# Patient Record
Sex: Female | Born: 1935
Health system: Southern US, Community
[De-identification: ages and names within clinical notes are randomized; demographics above are authoritative.]

## PROBLEM LIST (undated history)

## (undated) DIAGNOSIS — M810 Age-related osteoporosis without current pathological fracture: Secondary | ICD-10-CM

## (undated) DIAGNOSIS — E46 Unspecified protein-calorie malnutrition: Secondary | ICD-10-CM

## (undated) DIAGNOSIS — G4700 Insomnia, unspecified: Secondary | ICD-10-CM

## (undated) DIAGNOSIS — I10 Essential (primary) hypertension: Secondary | ICD-10-CM

## (undated) DIAGNOSIS — M341 CR(E)ST syndrome: Secondary | ICD-10-CM

## (undated) DIAGNOSIS — G629 Polyneuropathy, unspecified: Secondary | ICD-10-CM

## (undated) HISTORY — DX: Essential (primary) hypertension: I10

## (undated) HISTORY — DX: Insomnia, unspecified: G47.00

## (undated) HISTORY — PX: OTHER SURGICAL HISTORY: SHX169

## (undated) HISTORY — DX: Cr(e)st syndrome: M34.1

## (undated) HISTORY — PX: CHOLECYSTECTOMY: SHX55

## (undated) HISTORY — DX: Age-related osteoporosis without current pathological fracture: M81.0

## (undated) HISTORY — PX: ABDOMINAL HYSTERECTOMY: SHX81

---

## 2010-08-13 LAB — COMPREHENSIVE METABOLIC PANEL
ALT: 22 U/L (ref 0–35)
AST: 18 U/L (ref 0–37)
Albumin: 3.4 g/dL — ABNORMAL LOW (ref 3.5–5.2)
Alkaline Phosphatase: 67 U/L (ref 39–117)
BUN: 17 mg/dL (ref 6–23)
CO2: 32 mEq/L (ref 19–32)
Calcium: 9.2 mg/dL (ref 8.4–10.5)
Chloride: 102 mEq/L (ref 96–112)
Creatinine, Ser: 0.89 mg/dL (ref 0.4–1.2)
GFR calc Af Amer: >60 mL/min (ref >60)
GFR calc non Af Amer: >60 mL/min (ref >60)
Glucose, Bld: 98 mg/dL (ref 70–99)
Potassium: 4.7 mEq/L (ref 3.5–5.1)
Sodium: 141 mEq/L (ref 135–145)
Total Bilirubin: 0.6 mg/dL (ref 0.3–1.2)
Total Protein: 6.8 g/dL (ref 6.0–8.3)

## 2010-08-13 LAB — SURGICAL PCR SCREEN
MRSA, PCR: NEGATIVE
Staphylococcus aureus: NEGATIVE

## 2010-08-13 LAB — CBC
HCT: 44.1 % (ref 36.0–46.0)
Hemoglobin: 14.2 g/dL (ref 12.0–15.0)
MCH: 30.6 pg (ref 26.0–34.0)
MCHC: 32.2 g/dL (ref 30.0–36.0)
MCV: 95 fL (ref 78.0–100.0)
Platelets: 231 10*3/uL (ref 150–400)
RBC: 4.64 MIL/uL (ref 3.87–5.11)
RDW: 14.3 % (ref 11.5–15.5)
WBC: 6.2 10*3/uL (ref 4.0–10.5)

## 2010-08-20 ENCOUNTER — Ambulatory Visit (HOSPITAL_COMMUNITY)
Admission: RE | Admit: 2010-08-20 | Discharge: 2010-08-20 | Payer: Self-pay | Source: Home / Self Care | Attending: Urology | Admitting: Urology

## 2010-08-24 NOTE — Op Note (Addendum)
Taylor Kramer, Taylor Kramer              ACCOUNT NO.:  [id number]  MEDICAL RECORD NO.:  [id number]          PATIENT TYPE:  AMB  LOCATION:  DAY                          FACILITY:  [facility], [facility], [facility]  PHYSICIAN:  Trystyn Dolley C. Vernie Ammons, M.D.  DATE OF BIRTH:  July 27, 1932  DATE OF PROCEDURE:  08/20/2010 DATE OF DISCHARGE:                              OPERATIVE REPORT   PREOPERATIVE DIAGNOSIS:  Right renal calculus.  POSTOPERATIVE DIAGNOSIS:  Right renal calculus.  PROCEDURES: 1. Cystoscopy. 2. Right retrograde pyelogram with interpretation. 3. Right double-J stent placement.  SURGEON:  Carman Auxier C. Vernie Ammons, M.D.  ANESTHESIA:  General.  BLOOD LOSS:  Zero.  DRAINS:  A 6-French, 24-cm double-J stent in the right ureter (no string)  COMPLICATIONS:  None.  INDICATIONS:  The patient is a 77 year old female who has a massive left UPJ stone that has resulted in essentially a nonfunctioning left kidney with a moderately large stone in her right renal pelvis.  With a single renal unit, we have discussed the need for treatment of her stone and also the need for a stent in preparation for that treatment with lithotripsy.  The risks, complications, alternatives, and limitations have been discussed.  The patient understands and has elected to proceed.  DESCRIPTION OF OPERATION:  After informed consent, the patient was brought to the major OR, placed on table, administered general anesthesia, then moved to the dorsal lithotomy position.  Genitalia was sterilely prepped and draped and an official time-out was then performed.  A 22-French cystoscope with 12-degree lens was then introduced into the bladder.  I noted 1+ trabeculation of the bladder but the bladder was fully inspected and noted to be free of any tumor, stones, or inflammatory lesions.  The ureteral orifices were normal in configuration and position.  A 6-French open-ended ureteral catheter was then passed through the cystoscope into the right  ureteral orifice.  Full strength Omnipaque contrast was then ejected through the open-ended stent and up the right ureter under direct fluoroscopic control.  This revealed a normal- appearing ureter throughout its course without mass effect or filling defects.  There is some tortuosity at the ureteropelvic junction region but it was patent and a filling defect was seen within the renal pelvis consistent with a stone seen on preoperative KUB.  A 0.038-inch floppy tip guidewire was then passed through the open-ended catheter, up the right ureter and into the area of the renal pelvis under fluoroscopic control.  The guidewire was left in place and the open-ended catheter was removed and it was replaced with double-J stent which was then passed over the guidewires into the renal pelvis.  As I began to remove the guidewire, good curl was noted in the renal pelvis and then in the bladder when the guidewire was completely removed.  The bladder was then drained.  The patient was awakened and taken to recovery room in stable and satisfactory condition.  She tolerated procedure well and there were no intraoperative complications.  She will be scheduled for lithotripsy of her right renal pelvic stone later today.     Anise Harbin C. Vernie Ammons, M.D.     MCO/MEDQ  D:  08/20/2010  T:  08/20/2010  Job:  [id number]  Electronically Signed by Ihor Gully M.D. on 08/24/2010 04:29:08 AM

## 2011-03-18 ENCOUNTER — Ambulatory Visit (HOSPITAL_COMMUNITY)
Admission: RE | Admit: 2011-03-18 | Discharge: 2011-03-18 | Disposition: A | Discharge status: Home / Self Care | Payer: Medicare Other | Source: Ambulatory Visit | Attending: Urology | Admitting: Urology

## 2011-03-18 DIAGNOSIS — I10 Essential (primary) hypertension: Secondary | ICD-10-CM | POA: Insufficient documentation

## 2011-03-18 DIAGNOSIS — N200 Calculus of kidney: Secondary | ICD-10-CM | POA: Insufficient documentation

## 2011-05-15 ENCOUNTER — Other Ambulatory Visit: Payer: Self-pay | Admitting: Internal Medicine

## 2011-05-15 DIAGNOSIS — Z1231 Encounter for screening mammogram for malignant neoplasm of breast: Secondary | ICD-10-CM

## 2011-06-04 ENCOUNTER — Ambulatory Visit
Admission: RE | Admit: 2011-06-04 | Discharge: 2011-06-04 | Disposition: A | Discharge status: Home / Self Care | Payer: Medicare Other | Source: Ambulatory Visit | Attending: Internal Medicine | Admitting: Internal Medicine

## 2011-06-04 DIAGNOSIS — Z1231 Encounter for screening mammogram for malignant neoplasm of breast: Secondary | ICD-10-CM

## 2011-08-08 DIAGNOSIS — R636 Underweight: Secondary | ICD-10-CM | POA: Diagnosis not present

## 2011-08-08 DIAGNOSIS — R05 Cough: Secondary | ICD-10-CM | POA: Diagnosis not present

## 2011-08-08 DIAGNOSIS — I10 Essential (primary) hypertension: Secondary | ICD-10-CM | POA: Diagnosis not present

## 2011-08-08 DIAGNOSIS — Z780 Asymptomatic menopausal state: Secondary | ICD-10-CM | POA: Diagnosis not present

## 2011-08-08 DIAGNOSIS — Z23 Encounter for immunization: Secondary | ICD-10-CM | POA: Diagnosis not present

## 2011-08-15 DIAGNOSIS — Z13820 Encounter for screening for osteoporosis: Secondary | ICD-10-CM | POA: Diagnosis not present

## 2011-08-15 DIAGNOSIS — Z780 Asymptomatic menopausal state: Secondary | ICD-10-CM | POA: Diagnosis not present

## 2011-12-06 DIAGNOSIS — N3942 Incontinence without sensory awareness: Secondary | ICD-10-CM | POA: Diagnosis not present

## 2011-12-06 DIAGNOSIS — M349 Systemic sclerosis, unspecified: Secondary | ICD-10-CM | POA: Diagnosis not present

## 2011-12-06 DIAGNOSIS — I10 Essential (primary) hypertension: Secondary | ICD-10-CM | POA: Diagnosis not present

## 2011-12-06 DIAGNOSIS — K5900 Constipation, unspecified: Secondary | ICD-10-CM | POA: Diagnosis not present

## 2011-12-06 DIAGNOSIS — G609 Hereditary and idiopathic neuropathy, unspecified: Secondary | ICD-10-CM | POA: Diagnosis not present

## 2012-02-06 DIAGNOSIS — G479 Sleep disorder, unspecified: Secondary | ICD-10-CM | POA: Diagnosis not present

## 2012-02-06 DIAGNOSIS — I10 Essential (primary) hypertension: Secondary | ICD-10-CM | POA: Diagnosis not present

## 2012-02-13 DIAGNOSIS — N200 Calculus of kidney: Secondary | ICD-10-CM | POA: Diagnosis not present

## 2012-05-20 DIAGNOSIS — I10 Essential (primary) hypertension: Secondary | ICD-10-CM | POA: Diagnosis not present

## 2012-05-20 DIAGNOSIS — F411 Generalized anxiety disorder: Secondary | ICD-10-CM | POA: Diagnosis not present

## 2012-05-20 DIAGNOSIS — Z23 Encounter for immunization: Secondary | ICD-10-CM | POA: Diagnosis not present

## 2012-05-20 DIAGNOSIS — G479 Sleep disorder, unspecified: Secondary | ICD-10-CM | POA: Diagnosis not present

## 2012-06-17 DIAGNOSIS — F411 Generalized anxiety disorder: Secondary | ICD-10-CM | POA: Diagnosis not present

## 2012-06-17 DIAGNOSIS — G4700 Insomnia, unspecified: Secondary | ICD-10-CM | POA: Diagnosis not present

## 2012-06-17 DIAGNOSIS — I10 Essential (primary) hypertension: Secondary | ICD-10-CM | POA: Diagnosis not present

## 2012-06-17 DIAGNOSIS — R05 Cough: Secondary | ICD-10-CM | POA: Diagnosis not present

## 2012-06-17 DIAGNOSIS — T148XXA Other injury of unspecified body region, initial encounter: Secondary | ICD-10-CM | POA: Diagnosis not present

## 2012-07-03 ENCOUNTER — Other Ambulatory Visit: Payer: Self-pay | Admitting: Internal Medicine

## 2012-07-03 DIAGNOSIS — Z1231 Encounter for screening mammogram for malignant neoplasm of breast: Secondary | ICD-10-CM

## 2012-08-06 DIAGNOSIS — L98499 Non-pressure chronic ulcer of skin of other sites with unspecified severity: Secondary | ICD-10-CM | POA: Diagnosis not present

## 2012-08-06 DIAGNOSIS — Z5181 Encounter for therapeutic drug level monitoring: Secondary | ICD-10-CM | POA: Diagnosis not present

## 2012-08-06 DIAGNOSIS — G479 Sleep disorder, unspecified: Secondary | ICD-10-CM | POA: Diagnosis not present

## 2012-08-06 DIAGNOSIS — I10 Essential (primary) hypertension: Secondary | ICD-10-CM | POA: Diagnosis not present

## 2012-08-06 DIAGNOSIS — M810 Age-related osteoporosis without current pathological fracture: Secondary | ICD-10-CM | POA: Diagnosis not present

## 2012-08-06 DIAGNOSIS — Z0000 Encounter for general adult medical examination without abnormal findings: Secondary | ICD-10-CM | POA: Diagnosis not present

## 2012-08-20 ENCOUNTER — Ambulatory Visit
Admission: RE | Admit: 2012-08-20 | Discharge: 2012-08-20 | Disposition: A | Discharge status: Home / Self Care | Payer: Medicare Other | Source: Ambulatory Visit | Attending: Internal Medicine | Admitting: Internal Medicine

## 2012-08-20 DIAGNOSIS — Z1231 Encounter for screening mammogram for malignant neoplasm of breast: Secondary | ICD-10-CM

## 2012-09-01 DIAGNOSIS — H61009 Unspecified perichondritis of external ear, unspecified ear: Secondary | ICD-10-CM | POA: Diagnosis not present

## 2012-12-03 DIAGNOSIS — I499 Cardiac arrhythmia, unspecified: Secondary | ICD-10-CM | POA: Diagnosis not present

## 2012-12-03 DIAGNOSIS — I10 Essential (primary) hypertension: Secondary | ICD-10-CM | POA: Diagnosis not present

## 2013-01-04 DIAGNOSIS — Z961 Presence of intraocular lens: Secondary | ICD-10-CM | POA: Diagnosis not present

## 2013-01-29 DIAGNOSIS — M25579 Pain in unspecified ankle and joints of unspecified foot: Secondary | ICD-10-CM | POA: Diagnosis not present

## 2013-02-04 DIAGNOSIS — I10 Essential (primary) hypertension: Secondary | ICD-10-CM | POA: Diagnosis not present

## 2013-02-04 DIAGNOSIS — M349 Systemic sclerosis, unspecified: Secondary | ICD-10-CM | POA: Diagnosis not present

## 2013-02-04 DIAGNOSIS — F0390 Unspecified dementia without behavioral disturbance: Secondary | ICD-10-CM | POA: Diagnosis not present

## 2013-02-04 DIAGNOSIS — W06XXXA Fall from bed, initial encounter: Secondary | ICD-10-CM | POA: Diagnosis not present

## 2013-05-13 DIAGNOSIS — M349 Systemic sclerosis, unspecified: Secondary | ICD-10-CM | POA: Diagnosis not present

## 2013-05-13 DIAGNOSIS — R6889 Other general symptoms and signs: Secondary | ICD-10-CM | POA: Diagnosis not present

## 2013-05-13 DIAGNOSIS — Z23 Encounter for immunization: Secondary | ICD-10-CM | POA: Diagnosis not present

## 2013-05-13 DIAGNOSIS — I10 Essential (primary) hypertension: Secondary | ICD-10-CM | POA: Diagnosis not present

## 2013-11-11 DIAGNOSIS — M810 Age-related osteoporosis without current pathological fracture: Secondary | ICD-10-CM | POA: Diagnosis not present

## 2013-11-11 DIAGNOSIS — Z0000 Encounter for general adult medical examination without abnormal findings: Secondary | ICD-10-CM | POA: Diagnosis not present

## 2013-11-11 DIAGNOSIS — Z23 Encounter for immunization: Secondary | ICD-10-CM | POA: Diagnosis not present

## 2013-11-11 DIAGNOSIS — I10 Essential (primary) hypertension: Secondary | ICD-10-CM | POA: Diagnosis not present

## 2013-11-11 DIAGNOSIS — Z1331 Encounter for screening for depression: Secondary | ICD-10-CM | POA: Diagnosis not present

## 2013-11-11 DIAGNOSIS — G479 Sleep disorder, unspecified: Secondary | ICD-10-CM | POA: Diagnosis not present

## 2013-11-25 DIAGNOSIS — M810 Age-related osteoporosis without current pathological fracture: Secondary | ICD-10-CM | POA: Diagnosis not present

## 2014-04-19 DIAGNOSIS — Z23 Encounter for immunization: Secondary | ICD-10-CM | POA: Diagnosis not present

## 2014-07-02 ENCOUNTER — Encounter: Payer: Self-pay | Admitting: *Deleted

## 2014-11-21 ENCOUNTER — Other Ambulatory Visit (HOSPITAL_COMMUNITY): Payer: Self-pay

## 2014-11-21 ENCOUNTER — Emergency Department (HOSPITAL_COMMUNITY): Payer: Medicare Other

## 2014-11-21 ENCOUNTER — Encounter (HOSPITAL_COMMUNITY): Payer: Self-pay | Admitting: Emergency Medicine

## 2014-11-21 ENCOUNTER — Inpatient Hospital Stay (HOSPITAL_COMMUNITY)
Admission: EM | Admit: 2014-11-21 | Discharge: 2014-11-24 | DRG: 690 | Disposition: A | Discharge status: Home / Self Care | Payer: Medicare Other | Attending: Internal Medicine | Admitting: Internal Medicine

## 2014-11-21 DIAGNOSIS — W1830XA Fall on same level, unspecified, initial encounter: Secondary | ICD-10-CM | POA: Diagnosis present

## 2014-11-21 DIAGNOSIS — M341 CR(E)ST syndrome: Secondary | ICD-10-CM | POA: Diagnosis not present

## 2014-11-21 DIAGNOSIS — Z9049 Acquired absence of other specified parts of digestive tract: Secondary | ICD-10-CM | POA: Diagnosis present

## 2014-11-21 DIAGNOSIS — R55 Syncope and collapse: Secondary | ICD-10-CM | POA: Diagnosis not present

## 2014-11-21 DIAGNOSIS — R404 Transient alteration of awareness: Secondary | ICD-10-CM | POA: Diagnosis not present

## 2014-11-21 DIAGNOSIS — I10 Essential (primary) hypertension: Secondary | ICD-10-CM | POA: Diagnosis not present

## 2014-11-21 DIAGNOSIS — Z79899 Other long term (current) drug therapy: Secondary | ICD-10-CM

## 2014-11-21 DIAGNOSIS — B951 Streptococcus, group B, as the cause of diseases classified elsewhere: Secondary | ICD-10-CM | POA: Diagnosis present

## 2014-11-21 DIAGNOSIS — Z8739 Personal history of other diseases of the musculoskeletal system and connective tissue: Secondary | ICD-10-CM

## 2014-11-21 DIAGNOSIS — Y9289 Other specified places as the place of occurrence of the external cause: Secondary | ICD-10-CM

## 2014-11-21 DIAGNOSIS — R531 Weakness: Secondary | ICD-10-CM | POA: Diagnosis not present

## 2014-11-21 DIAGNOSIS — S21212A Laceration without foreign body of left back wall of thorax without penetration into thoracic cavity, initial encounter: Secondary | ICD-10-CM | POA: Diagnosis not present

## 2014-11-21 DIAGNOSIS — G629 Polyneuropathy, unspecified: Secondary | ICD-10-CM | POA: Diagnosis present

## 2014-11-21 DIAGNOSIS — Z87891 Personal history of nicotine dependence: Secondary | ICD-10-CM

## 2014-11-21 DIAGNOSIS — N390 Urinary tract infection, site not specified: Secondary | ICD-10-CM | POA: Diagnosis not present

## 2014-11-21 DIAGNOSIS — E860 Dehydration: Secondary | ICD-10-CM | POA: Diagnosis not present

## 2014-11-21 DIAGNOSIS — R918 Other nonspecific abnormal finding of lung field: Secondary | ICD-10-CM | POA: Diagnosis not present

## 2014-11-21 DIAGNOSIS — M810 Age-related osteoporosis without current pathological fracture: Secondary | ICD-10-CM | POA: Diagnosis present

## 2014-11-21 HISTORY — DX: Polyneuropathy, unspecified: G62.9

## 2014-11-21 LAB — CBC WITH DIFFERENTIAL/PLATELET
Basophils Absolute: 0 10*3/uL (ref 0.0–0.1)
Basophils Relative: 0 % (ref 0–1)
Eosinophils Absolute: 0.1 10*3/uL (ref 0.0–0.7)
Eosinophils Relative: 3 % (ref 0–5)
HCT: 44.1 % (ref 36.0–46.0)
Hemoglobin: 14.3 g/dL (ref 12.0–15.0)
Lymphocytes Relative: 27 % (ref 12–46)
Lymphs Abs: 1.5 10*3/uL (ref 0.7–4.0)
MCH: 29.6 pg (ref 26.0–34.0)
MCHC: 32.4 g/dL (ref 30.0–36.0)
MCV: 91.3 fL (ref 78.0–100.0)
Monocytes Absolute: 0.5 10*3/uL (ref 0.1–1.0)
Monocytes Relative: 8 % (ref 3–12)
Neutro Abs: 3.5 10*3/uL (ref 1.7–7.7)
Neutrophils Relative %: 62 % (ref 43–77)
Platelets: 220 10*3/uL (ref 150–400)
RBC: 4.83 MIL/uL (ref 3.87–5.11)
RDW: 13.9 % (ref 11.5–15.5)
WBC: 5.6 10*3/uL (ref 4.0–10.5)

## 2014-11-21 LAB — COMPREHENSIVE METABOLIC PANEL
ALT: 9 U/L (ref 0–35)
AST: 18 U/L (ref 0–37)
Albumin: 3.8 g/dL (ref 3.5–5.2)
Alkaline Phosphatase: 62 U/L (ref 39–117)
Anion gap: 11 (ref 5–15)
BUN: 21 mg/dL (ref 6–23)
CO2: 28 mmol/L (ref 19–32)
Calcium: 9.1 mg/dL (ref 8.4–10.5)
Chloride: 103 mmol/L (ref 96–112)
Creatinine, Ser: 0.93 mg/dL (ref 0.50–1.10)
GFR calc Af Amer: 65 mL/min — ABNORMAL LOW (ref >90)
GFR calc non Af Amer: 56 mL/min — ABNORMAL LOW (ref >90)
Glucose, Bld: 91 mg/dL (ref 70–99)
Potassium: 3.5 mmol/L (ref 3.5–5.1)
Sodium: 142 mmol/L (ref 135–145)
Total Bilirubin: 0.4 mg/dL (ref 0.3–1.2)
Total Protein: 7 g/dL (ref 6.0–8.3)

## 2014-11-21 LAB — URINALYSIS, ROUTINE W REFLEX MICROSCOPIC
Bilirubin Urine: NEGATIVE
Glucose, UA: NEGATIVE mg/dL
Ketones, ur: NEGATIVE mg/dL
Nitrite: NEGATIVE
Protein, ur: NEGATIVE mg/dL
Specific Gravity, Urine: 1.031 — ABNORMAL HIGH (ref 1.005–1.030)
Urobilinogen, UA: 1 mg/dL (ref 0.0–1.0)
pH: 5.5 (ref 5.0–8.0)

## 2014-11-21 LAB — URINE MICROSCOPIC-ADD ON

## 2014-11-21 LAB — I-STAT TROPONIN, ED: Troponin i, poc: 0 ng/mL (ref 0.00–0.08)

## 2014-11-21 MED ORDER — VITAMIN D3 25 MCG (1000 UNIT) PO TABS
1000.0000 [IU] | ORAL_TABLET | Freq: Every day | ORAL | Status: DC
  Administered 2014-11-22 – 2014-11-24 (×3): 1000 [IU] via ORAL
  Filled 2014-11-21 (×4): qty 1

## 2014-11-21 MED ORDER — PSYLLIUM 0.52 G PO CAPS: 0.5200 g | ORAL_CAPSULE | Freq: Every day | ORAL | Status: DC

## 2014-11-21 MED ORDER — ONDANSETRON HCL 4 MG PO TABS: 4.0000 mg | ORAL_TABLET | Freq: Four times a day (QID) | ORAL | Status: DC | PRN

## 2014-11-21 MED ORDER — SODIUM CHLORIDE 0.9 % IJ SOLN
3.0000 mL | Freq: Two times a day (BID) | INTRAMUSCULAR | Status: DC
  Administered 2014-11-22 – 2014-11-23 (×3): 3 mL via INTRAVENOUS

## 2014-11-21 MED ORDER — LORATADINE 10 MG PO TABS
10.0000 mg | ORAL_TABLET | Freq: Every day | ORAL | Status: DC
  Administered 2014-11-22 – 2014-11-24 (×3): 10 mg via ORAL
  Filled 2014-11-21 (×4): qty 1

## 2014-11-21 MED ORDER — ZOLPIDEM TARTRATE 5 MG PO TABS
5.0000 mg | ORAL_TABLET | Freq: Every day | ORAL | Status: DC
  Administered 2014-11-22 – 2014-11-23 (×3): 5 mg via ORAL
  Filled 2014-11-21 (×3): qty 1

## 2014-11-21 MED ORDER — DEXTROSE 5 % IV SOLN
1.0000 g | Freq: Once | INTRAVENOUS | Status: AC
  Administered 2014-11-21: 1 g via INTRAVENOUS
  Filled 2014-11-21: qty 10

## 2014-11-21 MED ORDER — POLYETHYLENE GLYCOL 3350 17 G PO PACK: 17.0000 g | PACK | Freq: Every day | ORAL | Status: DC | PRN

## 2014-11-21 MED ORDER — AMLODIPINE BESYLATE 2.5 MG PO TABS
2.5000 mg | ORAL_TABLET | Freq: Every day | ORAL | Status: DC
  Administered 2014-11-22 – 2014-11-24 (×3): 2.5 mg via ORAL
  Filled 2014-11-21 (×5): qty 1

## 2014-11-21 MED ORDER — ONDANSETRON HCL 4 MG/2ML IJ SOLN: 4.0000 mg | Freq: Four times a day (QID) | INTRAMUSCULAR | Status: DC | PRN

## 2014-11-21 MED ORDER — ENOXAPARIN SODIUM 40 MG/0.4ML ~~LOC~~ SOLN
40.0000 mg | Freq: Every day | SUBCUTANEOUS | Status: DC
  Administered 2014-11-22 – 2014-11-23 (×3): 40 mg via SUBCUTANEOUS
  Filled 2014-11-21 (×4): qty 0.4

## 2014-11-21 MED ORDER — BISACODYL 5 MG PO TBEC
10.0000 mg | DELAYED_RELEASE_TABLET | Freq: Every day | ORAL | Status: DC | PRN
Start: 2014-11-21 — End: 2014-11-24

## 2014-11-21 MED ORDER — SODIUM CHLORIDE 0.9 % IJ SOLN: 3.0000 mL | Freq: Two times a day (BID) | INTRAMUSCULAR | Status: DC

## 2014-11-21 MED ORDER — ZOLPIDEM TARTRATE 10 MG PO TABS: 10.0000 mg | ORAL_TABLET | Freq: Every day | ORAL | Status: DC

## 2014-11-21 MED ORDER — CALCIUM POLYCARBOPHIL 625 MG PO TABS
625.0000 mg | ORAL_TABLET | Freq: Every day | ORAL | Status: DC
  Administered 2014-11-22 – 2014-11-24 (×3): 625 mg via ORAL
  Filled 2014-11-21 (×4): qty 1

## 2014-11-21 MED ORDER — CEFTRIAXONE SODIUM IN DEXTROSE 20 MG/ML IV SOLN
1.0000 g | INTRAVENOUS | Status: DC
  Administered 2014-11-22: 1 g via INTRAVENOUS
  Filled 2014-11-21 (×2): qty 50

## 2014-11-21 MED ORDER — FLUTICASONE PROPIONATE 50 MCG/ACT NA SUSP
2.0000 | Freq: Every day | NASAL | Status: DC
  Administered 2014-11-22 – 2014-11-24 (×3): 2 via NASAL
  Filled 2014-11-21: qty 16

## 2014-11-21 MED ORDER — ACETAMINOPHEN 325 MG PO TABS: 650.0000 mg | ORAL_TABLET | Freq: Four times a day (QID) | ORAL | Status: DC | PRN

## 2014-11-21 MED ORDER — ACETAMINOPHEN 650 MG RE SUPP: 650.0000 mg | Freq: Four times a day (QID) | RECTAL | Status: DC | PRN

## 2014-11-21 NOTE — Progress Notes (Signed)
EDCM spoke to patient and her family at bedside.  Patient confirms she is from [facility] at [place].  Patient reports her pcp is located at [facility] at [place].  Patient's pcp used to be Dr. Myrtis Ser, but she has left the practice per patient.  Patient unable to tell [facility] who her new pcp is at the same office.  No further EDCM needs at this time.

## 2014-11-21 NOTE — ED Provider Notes (Signed)
CSN: [id number]     Arrival date & time 11/21/14  1800 History   First MD Initiated Contact with Patient 11/21/14 1805     Chief Complaint  Patient presents with  . Weakness     (Consider location/radiation/quality/duration/timing/severity/associated sxs/prior Treatment) Patient is a 81 y.o. female presenting with weakness. The history is provided by the patient. No language interpreter was used.  Weakness Associated symptoms include weakness. Pertinent negatives include no headaches, nausea, numbness or vomiting.  Taylor Kramer is an 81 y.o female with a history of HTN who presents by EMS from [facility] for weakness after getting out of the shower today.  She states someone helped her shower but she could not stand.  She was put in a wheelchair and they took her for lunch thinking that maybe she needed to eat something.  She states that after she came back from eating she used the walker to go to the bathroom and still felt weak.  She collapsed on the floor but slumped to the floor and sat down. She states she then called for help.  She denies loss of consciousness or head injury. She states she felt fine before she went for a shower. She denies fever, headache, cough, chest pain, shortness of breath, abdominal pain, dysuria, urinary frequency, hematuria, or leg swelling.   Past Medical History  Diagnosis Date  . Hypertension   . Osteoporosis   . CREST syndrome   . Insomnia    History reviewed. No pertinent past surgical history. Family History  Problem Relation Age of Onset  . Family history unknown: Yes   History  Substance Use Topics  . Smoking status: Former Research scientist (life sciences)  . Smokeless tobacco: Not on file  . Alcohol Use: Yes     Comment: one drink a wek   OB History    No data available     Review of Systems  Gastrointestinal: Negative for nausea, vomiting and diarrhea.  Neurological: Positive for weakness. Negative for dizziness, syncope, numbness and headaches.   All other systems reviewed and are negative.     Allergies  Review of patient's allergies indicates no known allergies.  Home Medications   Prior to Admission medications   Medication Sig Start Date End Date Taking? Authorizing Provider  acetaminophen (TYLENOL) 500 MG tablet Take 500 mg by mouth every 6 (six) hours as needed for moderate pain.   Yes Historical Provider, MD  amLODipine (NORVASC) 2.5 MG tablet Take 2.5 mg by mouth daily.   Yes Historical Provider, MD  bisacodyl (DULCOLAX) 5 MG EC tablet Take 10 mg by mouth daily as needed for mild constipation or moderate constipation.   Yes Historical Provider, MD  cholecalciferol (VITAMIN D) 1000 UNITS tablet Take 1,000 Units by mouth daily.   Yes Historical Provider, MD  fluticasone (FLONASE) 50 MCG/ACT nasal spray Place 2 sprays into both nostrils daily.   Yes Historical Provider, MD  loratadine (CLARITIN) 10 MG tablet Take 10 mg by mouth daily.   Yes Historical Provider, MD  polyethylene glycol (MIRALAX / GLYCOLAX) packet Take 17 g by mouth daily as needed for mild constipation.   Yes Historical Provider, MD  psyllium (KONSYL) 0.52 G capsule Take 0.52 g by mouth daily.   Yes Historical Provider, MD  alendronate (FOSAMAX) 70 MG tablet Take 70 mg by mouth once a week. Take with a full glass of water on an empty stomach on Fridays.    Historical Provider, MD  zolpidem (AMBIEN) 10 MG tablet Take 10  mg by mouth at bedtime.     Historical Provider, MD   BP 119/61 mmHg  Pulse 82  Temp(Src) 97.5 F (36.4 C) (Oral)  Resp 22  SpO2 100% Physical Exam  Constitutional: She is oriented to person, place, and time. She appears well-developed and well-nourished.  HENT:  Head: Normocephalic and atraumatic.  Eyes: Conjunctivae are normal.  Neck: Normal range of motion. Neck supple.  Cardiovascular: Normal rate, regular rhythm and normal heart sounds.   Pulmonary/Chest: Effort normal. She has wheezes in the right lower field, the left middle  field and the left lower field.  Abdominal: Soft. Normal appearance. She exhibits no distension. There is no tenderness. There is no guarding.  Musculoskeletal: Normal range of motion.  Neurological: She is alert and oriented to person, place, and time. No sensory deficit. GCS eye subscore is 4. GCS verbal subscore is 5. GCS motor subscore is 6.  Cranial nerves III-XII intact. Upper extremity strength is 4/5.  Lower extremity strength is 3/5.   Skin: Skin is warm and dry.  Nursing note and vitals reviewed.   ED Course  Procedures (including critical care time) Labs Review Labs Reviewed  COMPREHENSIVE METABOLIC PANEL - Abnormal; Notable for the following:    GFR calc non Af Amer 56 (*)    GFR calc Af Amer 65 (*)    All other components within normal limits  URINALYSIS, ROUTINE W REFLEX MICROSCOPIC - Abnormal; Notable for the following:    APPearance CLOUDY (*)    Specific Gravity, Urine 1.031 (*)    Hgb urine dipstick SMALL (*)    Leukocytes, UA LARGE (*)    All other components within normal limits  URINE MICROSCOPIC-ADD ON - Abnormal; Notable for the following:    Bacteria, UA MANY (*)    Casts HYALINE CASTS (*)    All other components within normal limits  CBC WITH DIFFERENTIAL/PLATELET  I-STAT TROPOININ, ED    Imaging Review Dg Chest 2 View  11/21/2014   CLINICAL DATA:  Pt c/o weakness, sob, back pain at lacerations across left dorsum s/p fall today at her nursing home. Pt states she just "fell out" and has been unable to recover energy.  EXAM: CHEST  2 VIEW  COMPARISON:  08/09/2010  FINDINGS: Cardiac silhouette normal in size and configuration. Aorta is mildly uncoiled. No mediastinal or hilar masses or evidence of adenopathy.  There there is increased peribronchial opacity in the left lower lobe when compared to the prior chest radiograph. This is accentuated by lower lung volumes and some rotation. However, could reflect acute bronchitis in the proper clinical setting. The  remainder of the lungs is clear. No pleural effusion or pneumothorax.  Bony thorax is demineralized but grossly intact.  IMPRESSION: 1. Left lower lobe opacity is most likely chronic but could reflect acute bronchitis if there are consistent clinical symptoms. No evidence of pneumonia or pulmonary edema.   Electronically Signed   By: Lajean Manes M.D.   On: 11/21/2014 20:23   EKG interpretation  Vent. rate 78 BPM PR interval 182 ms QRS duration 99 ms QT/QTc 422/481 ms P-R-T axes 50 -59 61 Sinus rhythm RSR' in V1 or V2, probably normal variant Borderline T wave abnormalities   MDM   Final diagnoses:  Acute UTI  Weakness generalized  Patient presents for weakness.  No other complaints and no pain. Her strength on exam is slightly decreased in the lower greater than upper extremities. No focal deficit. She is ambulatory but with a  wheelchair.   Her CMP and CBC are unremarkable.  Her CXR shows no pneumonia or pleural effusion. Her troponin is negative and her ekg is not concerning.  Her vitals are stable and she is afebrile.   She does have a UTI which I think is the likely cause of her collapsing to the floor and her weakness.   I have discussed this case with Dr. Johnney Killian who has seen the patient. She will need admission for weakness and UTI.  I have started the patient on IV Rocephin.  Dr. Hal Hope spoke to Dr. Johnney Killian in the ED.  He will accept the patient to tele.     Ottie Glazier, PA-C 11/22/14 0005  Charlesetta Shanks, MD 11/26/14 [phone]

## 2014-11-21 NOTE — Progress Notes (Signed)
ANTIBIOTIC CONSULT NOTE - INITIAL  Pharmacy Consult for Ceftriaxone Indication: UTI  No Known Allergies  Patient Measurements: Height: 5\' 7"  (170.2 cm) Weight: 125 lb 9.6 oz (56.972 kg) IBW/kg (Calculated) : 61.6  Vital Signs: Temp: 98.3 F (36.8 C) (04/25 2330) Temp Source: Oral (04/25 2330) BP: 125/74 mmHg (04/25 2330) Pulse Rate: 92 (04/25 2330) Intake/Output from previous day:   Intake/Output from this shift:    Labs:  Recent Labs  11/21/14 1828  WBC 5.6  HGB 14.3  PLT 220  CREATININE 0.93   Estimated Creatinine Clearance: 42.7 mL/min (by C-G formula based on Cr of 0.93). No results for input(s): VANCOTROUGH, VANCOPEAK, VANCORANDOM, GENTTROUGH, GENTPEAK, GENTRANDOM, TOBRATROUGH, TOBRAPEAK, TOBRARND, AMIKACINPEAK, AMIKACINTROU, AMIKACIN in the last 72 hours.   Microbiology: No results found for this or any previous visit (from the past 720 hour(s)).  Medical History: Past Medical History  Diagnosis Date  . Hypertension   . Osteoporosis   . CREST syndrome   . Insomnia   . Neuropathy     Medications:  Scheduled:  . [START ON 11/22/2014] amLODipine  2.5 mg Oral Daily  . [START ON 11/22/2014] cefTRIAXone (ROCEPHIN)  IV  1 g Intravenous Q24H  . [START ON 11/22/2014] cholecalciferol  1,000 Units Oral Daily  . enoxaparin (LOVENOX) injection  40 mg Subcutaneous QHS  . [START ON 11/22/2014] fluticasone  2 spray Each Nare Daily  . [START ON 11/22/2014] loratadine  10 mg Oral Daily  . [START ON 11/22/2014] polycarbophil  625 mg Oral Daily  . sodium chloride  3 mL Intravenous Q12H  . sodium chloride  3 mL Intravenous Q12H  . zolpidem  5 mg Oral QHS   Infusions:   Assessment:  81 yr female s/p fall.  UA suggests UTI.  Patient has received Ceftriaxone 1gm IV x 1 in the ED.  Pharmacy consulted to dose Ceftriaxone for UTI  Urine culture ordered  CrCl ~ 42 ml/min  Goal of Therapy:  Eradication of infection  Plan:  Follow up culture results  Ceftriaxone 1gm IV  q24h - no further adjustment of dose necessary  Kamden Stanislaw, Toribio Harbour, PharmD 11/21/2014,11:42 PM

## 2014-11-21 NOTE — ED Notes (Signed)
Bed: WA01 Expected date:  Expected time:  Means of arrival:  Comments: EMS/81yo/weakness

## 2014-11-21 NOTE — H&P (Signed)
[facility] History and Physical  Taylor Kramer OFB:[id number] DOB: 1931-09-28 DOA: 11/21/2014  Referring physician: Dr. Vallery Kramer. ER physician. PCP: PROVIDER NOT IN SYSTEM used to be Taylor Kramer. Patient follows up at [facility] at [place].  Chief Complaint: Fall.  HPI: Taylor Kramer is a 81 y.o. female with history of crest syndrome, hypertension, neuropathy with difficulty talking who is usually on wheelchair and would be able to ambulate to the bathroom with help of walker, lives at a nursing home had a sudden fall while trying to walk to the bathroom. Patient states she felt something warm going up from her epigastric area to the chest following which he suddenly lost control and fell. Did not lose consciousness. Did not have any chest pain shortness of breath nausea vomiting abdominal pain diarrhea fever chills or any productive cough. After the fall patient was not able to get up herself. Patient was brought to the ER and in the ER chest x-ray was showing non-specific findings and EKG was a normal sinus rhythm with nonspecific T-wave changes. Cardiac markers were negative. UA showing features of UTI. Patient has been elevated for further observation. Patient states 2 years ago she had palpitations and at the time patient was referred to cardiologist and workup at that time was negative. Presently denies any palpitations. [name] states when he saw her first after the fall patient's lower extremities were looking cyanotic.    Review of Systems: As presented in the history of presenting illness, rest negative.  Past Medical History  Diagnosis Date  . Hypertension   . Osteoporosis   . CREST syndrome   . Insomnia   . Neuropathy    Past Surgical History  Procedure Laterality Date  . Cholecystectomy    . Abdominal hysterectomy    . Renal stones     Social History:  reports that she has quit smoking. She does not have any smokeless tobacco history on file. She reports  that she drinks alcohol. She reports that she does not use illicit drugs. Where does patient live nursing home. Can patient participate in ADLs? Yes.  No Known Allergies  Family History:  Family History  Problem Relation Age of Onset  . Neuropathy Brother       Prior to Admission medications   Medication Sig Start Date End Date Taking? Authorizing Provider  acetaminophen (TYLENOL) 500 MG tablet Take 500 mg by mouth every 6 (six) hours as needed for moderate pain.   Yes Historical Provider, MD  amLODipine (NORVASC) 2.5 MG tablet Take 2.5 mg by mouth daily.   Yes Historical Provider, MD  bisacodyl (DULCOLAX) 5 MG EC tablet Take 10 mg by mouth daily as needed for mild constipation or moderate constipation.   Yes Historical Provider, MD  cholecalciferol (VITAMIN D) 1000 UNITS tablet Take 1,000 Units by mouth daily.   Yes Historical Provider, MD  fluticasone (FLONASE) 50 MCG/ACT nasal spray Place 2 sprays into both nostrils daily.   Yes Historical Provider, MD  loratadine (CLARITIN) 10 MG tablet Take 10 mg by mouth daily.   Yes Historical Provider, MD  polyethylene glycol (MIRALAX / GLYCOLAX) packet Take 17 g by mouth daily as needed for mild constipation.   Yes Historical Provider, MD  psyllium (KONSYL) 0.52 G capsule Take 0.52 g by mouth daily.   Yes Historical Provider, MD  alendronate (FOSAMAX) 70 MG tablet Take 70 mg by mouth once a week. Take with a full glass of water on an empty stomach on Fridays.  Historical Provider, MD  zolpidem (AMBIEN) 10 MG tablet Take 10 mg by mouth at bedtime.     Historical Provider, MD    Physical Exam: Filed Vitals:   11/21/14 1802 11/21/14 2048 11/21/14 2100 11/21/14 2310  BP: 137/70 119/61 139/70 120/66  Pulse: 78 82 79 86  Temp: 97.5 F (36.4 C)     TempSrc: Oral     Resp: 16 22 16 19   SpO2: 95% 100% 92% 94%     General:  Moderately built and nourished.  Eyes: Anicteric no pallor.  ENT: No discharge from the ears eyes nose and  mouth.  Neck: No mass felt.  Cardiovascular: S1-S2 heard.  Respiratory: No rhonchi or crepitations.  Abdomen: Soft nontender bowel sounds present.  Skin: No rash.  Musculoskeletal: No edema.  Psychiatric: Appears normal.  Neurologic: Alert awake oriented to time place and person. Moves all extremities.  Labs on Admission:  Basic Metabolic Panel:  Recent Labs Lab 11/21/14 1828  NA 142  K 3.5  CL 103  CO2 28  GLUCOSE 91  BUN 21  CREATININE 0.93  CALCIUM 9.1   Liver Function Tests:  Recent Labs Lab 11/21/14 1828  AST 18  ALT 9  ALKPHOS 62  BILITOT 0.4  PROT 7.0  ALBUMIN 3.8   No results for input(s): LIPASE, AMYLASE in the last 168 hours. No results for input(s): AMMONIA in the last 168 hours. CBC:  Recent Labs Lab 11/21/14 1828  WBC 5.6  NEUTROABS 3.5  HGB 14.3  HCT 44.1  MCV 91.3  PLT 220   Cardiac Enzymes: No results for input(s): CKTOTAL, CKMB, CKMBINDEX, TROPONINI in the last 168 hours.  BNP (last 3 results) No results for input(s): BNP in the last 8760 hours.  ProBNP (last 3 results) No results for input(s): PROBNP in the last 8760 hours.  CBG: No results for input(s): GLUCAP in the last 168 hours.  Radiological Exams on Admission: Dg Chest 2 View  11/21/2014   CLINICAL DATA:  Pt c/o weakness, sob, back pain at lacerations across left dorsum s/p fall today at her nursing home. Pt states she just "fell out" and has been unable to recover energy.  EXAM: CHEST  2 VIEW  COMPARISON:  08/09/2010  FINDINGS: Cardiac silhouette normal in size and configuration. Aorta is mildly uncoiled. No mediastinal or hilar masses or evidence of adenopathy.  There there is increased peribronchial opacity in the left lower lobe when compared to the prior chest radiograph. This is accentuated by lower lung volumes and some rotation. However, could reflect acute bronchitis in the proper clinical setting. The remainder of the lungs is clear. No pleural effusion or  pneumothorax.  Bony thorax is demineralized but grossly intact.  IMPRESSION: 1. Left lower lobe opacity is most likely chronic but could reflect acute bronchitis if there are consistent clinical symptoms. No evidence of pneumonia or pulmonary edema.   Electronically Signed   By: Lajean Manes M.D.   On: 11/21/2014 20:23    EKG: Independently reviewed. Normal sinus rhythm with nonspecific T-wave changes. QTC 481 ms.  Assessment/Plan Principal Problem:   Near syncope Active Problems:   UTI (lower urinary tract infection)   Hypertension   History of CREST syndrome   Acute UTI   1. Near syncope - cause not clear. At this time we will closely monitor in telemetry for any arrhythmias. Check 2-D echo. Since patient had some abdominal discomfort in the chest prior to the episode we will cycle cardiac markers. 2.  UTI - patient has been placed on ceftriaxone. Check urine cultures. 3. History of crest was near normal medications. 4. Abnormal chest x-ray - may need follow-up as outpatient.   DVT Prophylaxis Lovenox.  Code Status: Full code.  Family Communication: Patient's sons at the bedside.  Disposition Plan: Admit for observation.    KAKRAKANDY,ARSHAD N. [facility] Pager [phone].  If 7PM-7AM, please contact night-coverage [email or web address] Password [facility] 11/21/2014, 11:28 PM

## 2014-11-21 NOTE — ED Notes (Signed)
Placed patient on bed pain for urine specimen. Unable to give urine specimen. Will attempt again.

## 2014-11-21 NOTE — ED Notes (Signed)
Per EMS: pt c/o weakness since 1000 this morning, pt states she was walking and knees buckled so she sat on the floor. No injury from sitting.

## 2014-11-22 DIAGNOSIS — Z87891 Personal history of nicotine dependence: Secondary | ICD-10-CM | POA: Diagnosis not present

## 2014-11-22 DIAGNOSIS — E860 Dehydration: Secondary | ICD-10-CM | POA: Diagnosis present

## 2014-11-22 DIAGNOSIS — R531 Weakness: Secondary | ICD-10-CM | POA: Diagnosis not present

## 2014-11-22 DIAGNOSIS — I10 Essential (primary) hypertension: Secondary | ICD-10-CM | POA: Diagnosis not present

## 2014-11-22 DIAGNOSIS — G629 Polyneuropathy, unspecified: Secondary | ICD-10-CM | POA: Diagnosis present

## 2014-11-22 DIAGNOSIS — M810 Age-related osteoporosis without current pathological fracture: Secondary | ICD-10-CM | POA: Diagnosis present

## 2014-11-22 DIAGNOSIS — Z79899 Other long term (current) drug therapy: Secondary | ICD-10-CM | POA: Diagnosis not present

## 2014-11-22 DIAGNOSIS — W1830XA Fall on same level, unspecified, initial encounter: Secondary | ICD-10-CM | POA: Diagnosis present

## 2014-11-22 DIAGNOSIS — Z8739 Personal history of other diseases of the musculoskeletal system and connective tissue: Secondary | ICD-10-CM | POA: Diagnosis not present

## 2014-11-22 DIAGNOSIS — Y9289 Other specified places as the place of occurrence of the external cause: Secondary | ICD-10-CM | POA: Diagnosis not present

## 2014-11-22 DIAGNOSIS — Z9049 Acquired absence of other specified parts of digestive tract: Secondary | ICD-10-CM | POA: Diagnosis present

## 2014-11-22 DIAGNOSIS — R55 Syncope and collapse: Secondary | ICD-10-CM | POA: Diagnosis not present

## 2014-11-22 DIAGNOSIS — B951 Streptococcus, group B, as the cause of diseases classified elsewhere: Secondary | ICD-10-CM | POA: Diagnosis present

## 2014-11-22 DIAGNOSIS — M341 CR(E)ST syndrome: Secondary | ICD-10-CM | POA: Diagnosis present

## 2014-11-22 DIAGNOSIS — N390 Urinary tract infection, site not specified: Secondary | ICD-10-CM | POA: Diagnosis not present

## 2014-11-22 LAB — BRAIN NATRIURETIC PEPTIDE: B NATRIURETIC PEPTIDE 5: 41.1 pg/mL (ref 0.0–100.0)

## 2014-11-22 LAB — CBC WITH DIFFERENTIAL/PLATELET
BASOS PCT: 0 % (ref 0–1)
Basophils Absolute: 0 10*3/uL (ref 0.0–0.1)
EOS PCT: 3 % (ref 0–5)
Eosinophils Absolute: 0.2 10*3/uL (ref 0.0–0.7)
HEMATOCRIT: 41.3 % (ref 36.0–46.0)
HEMOGLOBIN: 12.8 g/dL (ref 12.0–15.0)
Lymphocytes Relative: 27 % (ref 12–46)
Lymphs Abs: 1.8 10*3/uL (ref 0.7–4.0)
MCH: 28.6 pg (ref 26.0–34.0)
MCHC: 31 g/dL (ref 30.0–36.0)
MCV: 92.2 fL (ref 78.0–100.0)
MONOS PCT: 7 % (ref 3–12)
Monocytes Absolute: 0.4 10*3/uL (ref 0.1–1.0)
Neutro Abs: 4.2 10*3/uL (ref 1.7–7.7)
Neutrophils Relative %: 63 % (ref 43–77)
Platelets: 212 10*3/uL (ref 150–400)
RBC: 4.48 MIL/uL (ref 3.87–5.11)
RDW: 14.2 % (ref 11.5–15.5)
WBC: 6.7 10*3/uL (ref 4.0–10.5)

## 2014-11-22 LAB — CREATININE, SERUM
Creatinine, Ser: 0.83 mg/dL (ref 0.50–1.10)
GFR calc Af Amer: 75 mL/min — ABNORMAL LOW (ref >90)
GFR, EST NON AFRICAN AMERICAN: 64 mL/min — AB (ref >90)

## 2014-11-22 LAB — COMPREHENSIVE METABOLIC PANEL
ALBUMIN: 3.3 g/dL — AB (ref 3.5–5.2)
ALT: 9 U/L (ref 0–35)
AST: 15 U/L (ref 0–37)
Alkaline Phosphatase: 60 U/L (ref 39–117)
Anion gap: 8 (ref 5–15)
BILIRUBIN TOTAL: 0.5 mg/dL (ref 0.3–1.2)
BUN: 17 mg/dL (ref 6–23)
CHLORIDE: 104 mmol/L (ref 96–112)
CO2: 31 mmol/L (ref 19–32)
CREATININE: 0.8 mg/dL (ref 0.50–1.10)
Calcium: 8.7 mg/dL (ref 8.4–10.5)
GFR calc Af Amer: 78 mL/min — ABNORMAL LOW (ref >90)
GFR, EST NON AFRICAN AMERICAN: 67 mL/min — AB (ref >90)
GLUCOSE: 82 mg/dL (ref 70–99)
POTASSIUM: 3.7 mmol/L (ref 3.5–5.1)
Sodium: 143 mmol/L (ref 135–145)
Total Protein: 6 g/dL (ref 6.0–8.3)

## 2014-11-22 LAB — CBC
HCT: 42.8 % (ref 36.0–46.0)
HEMOGLOBIN: 13.6 g/dL (ref 12.0–15.0)
MCH: 29.1 pg (ref 26.0–34.0)
MCHC: 31.8 g/dL (ref 30.0–36.0)
MCV: 91.6 fL (ref 78.0–100.0)
PLATELETS: 237 10*3/uL (ref 150–400)
RBC: 4.67 MIL/uL (ref 3.87–5.11)
RDW: 14.1 % (ref 11.5–15.5)
WBC: 8.7 10*3/uL (ref 4.0–10.5)

## 2014-11-22 LAB — MRSA PCR SCREENING: MRSA BY PCR: NEGATIVE

## 2014-11-22 LAB — TROPONIN I: Troponin I: <0.03 ng/mL (ref <0.031)

## 2014-11-22 MED ORDER — PANTOPRAZOLE SODIUM 40 MG PO TBEC
40.0000 mg | DELAYED_RELEASE_TABLET | Freq: Every day | ORAL | Status: DC
  Administered 2014-11-22 – 2014-11-24 (×3): 40 mg via ORAL
  Filled 2014-11-22 (×3): qty 1

## 2014-11-22 NOTE — Evaluation (Signed)
Physical Therapy Evaluation Patient Details Name: Taylor Kramer MRN: [id number] DOB: 1932-02-08 Today's Date: 11/22/2014   History of Present Illness  81 yo female admitted with neary syncope, weakness, fall at ALF. Hx of HTN, osteoporosis, neuropathy  Clinical Impression  On eval, pt required Mod assist for mobility-able to stand at EOB and perform pre-gait/side-stepping with RW. Pt is weak-fatigues easily with minimal activity. Pt is unsteady and ataxic with performance of standing/stepping tasks. Remains at high risk for falls. Discussed d/c plan-feel pt may need ST rehab at SNF however pt prefers to return to ALF. ALF will need to be able to provide supervision and assist for ALL tasks.     Follow Up Recommendations SNF;Supervision/Assistance - 24 hour (unless facility can provide 24 hour supervision and assist for ALL tasks.)    Equipment Recommendations  None recommended by PT    Recommendations for Other Services OT consult     Precautions / Restrictions Precautions Precautions: Fall Restrictions Weight Bearing Restrictions: No      Mobility  Bed Mobility Overal bed mobility: Needs Assistance Bed Mobility: Supine to Sit;Sit to Supine     Supine to sit: HOB elevated;Mod assist Sit to supine: HOB elevated;Min assist   General bed mobility comments: Assist for trunk and LEs. Increased time. Mobility is effortful for pt.   Transfers Overall transfer level: Needs assistance Equipment used: Rolling walker (2 wheeled) Transfers: Sit to/from Stand Sit to Stand: Mod assist         General transfer comment: Assist to rise, stabilize, control descent. VCs safety, technique hand placement. Difficulty with trunk/hip extension. Wide stance in static standing.   Ambulation/Gait Ambulation/Gait assistance: Mod assist           General Gait Details: Pre-gait at [facility] in place x3 each leg-increased time/difficulty noted. Very unsteady/ataxic. Side steps to HOb with  RW  Stairs            Wheelchair Mobility    Modified Rankin (Stroke Patients Only)       Balance Overall balance assessment: History of Falls;Needs assistance         Standing balance support: Bilateral upper extremity supported;During functional activity Standing balance-Leahy Scale: Poor                               Pertinent Vitals/Pain Pain Assessment: No/denies pain    Home Living Family/patient expects to be discharged to:: [place]: [place] Rile - 2 wheels;Wheelchair - manual;Grab bars - tub/shower      Prior Function Level of Independence: Needs assistance   Gait / Transfers Assistance Needed: uses RW-small distances in room only. Wheelchair to get to/from dining room  ADL's / Homemaking Assistance Needed: assist with bathiing and getting into shower. pt able to dress herself. assist with taking meds.         Hand Dominance        Extremity/Trunk Assessment   Upper Extremity Assessment: LUE deficits/detail;RUE deficits/detail     RUE Sensation: decreased light touch;decreased proprioception     Lower Extremity Assessment: RLE deficits/detail;LLE deficits/detail RLE Deficits / Details: Strength ~3+/5 LLE Deficits / Details: Strength ~3+/5  Cervical / Trunk Assessment: Kyphotic  Communication   Communication: Expressive difficulties  Cognition Arousal/Alertness: Awake/alert Behavior During Therapy: WFL for tasks assessed/performed Overall Cognitive Status: Within Functional Limits for tasks assessed  General Comments      Exercises        Assessment/Plan    PT Assessment Patient needs continued PT services  PT Diagnosis Difficulty walking;Abnormality of gait;Generalized weakness   PT Problem List Decreased strength;Decreased activity tolerance;Decreased range of motion;Decreased balance;Decreased mobility;Decreased knowledge of use of DME;Impaired sensation   PT Treatment Interventions DME instruction;Gait training;Functional mobility training;Therapeutic activities;Therapeutic exercise;Patient/family education;Balance training   PT Goals (Current goals can be found in the Care Plan section) Acute Rehab PT Goals Patient Stated Goal: to return to ALF PT Goal Formulation: With patient Time For Goal Achievement: 12/06/14 Potential to Achieve Goals: Good    Frequency Min 3X/week   Barriers to discharge        Co-evaluation               End of Session Equipment Utilized During Treatment: Gait belt Activity Tolerance: Patient limited by fatigue Patient left: in bed;with call bell/phone within reach;with bed alarm set      Functional Assessment Tool Used: clinical judgement Functional Limitation: Mobility: Walking and moving around Mobility: Walking and Moving Around Current Status (Z4827): At least 20 percent but less than 40 percent impaired, limited or restricted Mobility: Walking and Moving Around Goal Status [phone]): At least 1 percent but less than 20 percent impaired, limited or restricted    Time: 0940-1005 PT Time Calculation (min) (ACUTE ONLY): 25 min   Charges:   PT Evaluation $Initial PT Evaluation Tier I: 1 Procedure PT Treatments $Therapeutic Activity: 8-22 mins   PT G Codes:   PT G-Codes **NOT FOR INPATIENT CLASS** Functional Assessment Tool Used: clinical judgement Functional Limitation: Mobility: Walking and moving around Mobility: Walking and Moving Around Current Status (J4492): At least 20 percent but less than 40 percent impaired, limited or restricted Mobility: Walking and Moving Around Goal Status [phone]): At least 1 percent but less than 20 percent impaired, limited or restricted    Weston Anna, MPT Pager: [phone]

## 2014-11-22 NOTE — Progress Notes (Signed)
UR completed 

## 2014-11-22 NOTE — Progress Notes (Signed)
Patient Demographics  Taylor Kramer, is a 81 y.o. female, DOB - 01/19/1932, ELT:[id number]  Admit date - 11/21/2014   Admitting Physician Rise Patience, MD  Outpatient Primary MD for the patient is PROVIDER NOT IN SYSTEM  LOS - 0   Chief Complaint  Patient presents with  . Weakness        Subjective:   Taylor Kramer today has, No headache, No chest pain, No abdominal pain - No Nausea, No Cough - SOB, still reports generalized weakness, reports her appetite is improving.  Assessment & Plan    Principal Problem:   Near syncope Active Problems:   UTI (lower urinary tract infection)   Hypertension   History of CREST syndrome   Acute UTI  Presyncope - Patient denies any loss of consciousness, this is most likely related to UTI and clinical dehydration. - Follow on 2-D echo - Cardiac enzymes negative 3  UTI - Continue with IV Rocephin, follow on urine cultures.  Hypertension - Continue with Norvasc  History of crest syndrome - Continue to follow as an outpatient   Code Status: Full  Family Communication: None at bedside  Disposition Plan: PT consult   Procedures  None   Consults   None   Medications  Scheduled Meds: . amLODipine  2.5 mg Oral Daily  . cefTRIAXone (ROCEPHIN)  IV  1 g Intravenous Q24H  . cholecalciferol  1,000 Units Oral Daily  . enoxaparin (LOVENOX) injection  40 mg Subcutaneous QHS  . fluticasone  2 spray Each Nare Daily  . loratadine  10 mg Oral Daily  . polycarbophil  625 mg Oral Daily  . sodium chloride  3 mL Intravenous Q12H  . sodium chloride  3 mL Intravenous Q12H  . zolpidem  5 mg Oral QHS   Continuous Infusions:  PRN Meds:.acetaminophen **OR** acetaminophen, bisacodyl, ondansetron **OR** ondansetron (ZOFRAN) IV, polyethylene glycol  DVT Prophylaxis  Lovenox   Lab Results  Component Value  Date   PLT 212 11/22/2014    Antibiotics    Anti-infectives    Start     Dose/Rate Route Frequency Ordered Stop   11/22/14 2300  cefTRIAXone (ROCEPHIN) 1 g in dextrose 5 % 50 mL IVPB - Premix     1 g 100 mL/hr over 30 Minutes Intravenous Every 24 hours 11/21/14 2341     11/21/14 2245  cefTRIAXone (ROCEPHIN) 1 g in dextrose 5 % 50 mL IVPB     1 g 100 mL/hr over 30 Minutes Intravenous  Once 11/21/14 2236 11/21/14 2336          Objective:   Filed Vitals:   11/21/14 2310 11/21/14 2330 11/22/14 0640 11/22/14 1415  BP: 120/66 125/74 129/70 125/69  Pulse: 86 92 82 94  Temp:  98.3 F (36.8 C) 98 F (36.7 C) 99.4 F (37.4 C)  TempSrc:  Oral Oral Oral  Resp: 19 20 19 20   Height:  5\' 7"  (1.702 m)    Weight:  56.972 kg (125 lb 9.6 oz)    SpO2: 94% 97% 95% 99%    Wt Readings from Last 3 Encounters:  11/21/14 56.972 kg (125 lb 9.6 oz)     Intake/Output Summary (Last 24 hours) at 11/22/14 1528 Last data filed at 11/22/14  1416  Gross per 24 hour  Intake    480 ml  Output    601 ml  Net   -121 ml     Physical Exam  Awake Alert, Oriented X 3, No new F.N deficits, Normal affect [place].AT,PERRAL Supple Neck,No JVD, No cervical lymphadenopathy appriciated.  Symmetrical Chest wall movement, Good air movement bilaterally, CTAB RRR,No Gallops,Rubs or new Murmurs, No Parasternal Heave +ve B.Sounds, Abd Soft, No tenderness, No organomegaly appriciated, No rebound - guarding or rigidity. No Cyanosis, Clubbing or edema, No new Rash or bruise     Data Review   Micro Results Recent Results (from the past 240 hour(s))  MRSA PCR Screening     Status: None   Collection Time: 11/22/14 12:18 AM  Result Value Ref Range Status   MRSA by PCR NEGATIVE NEGATIVE Final    Comment:        The GeneXpert MRSA Assay (FDA approved for NASAL specimens only), is one component of a comprehensive MRSA colonization surveillance program. It is not intended to diagnose MRSA infection nor to guide  or monitor treatment for MRSA infections.     Radiology Reports Dg Chest 2 View  11/21/2014   CLINICAL DATA:  Pt c/o weakness, sob, back pain at lacerations across left dorsum s/p fall today at her nursing home. Pt states she just "fell out" and has been unable to recover energy.  EXAM: CHEST  2 VIEW  COMPARISON:  08/09/2010  FINDINGS: Cardiac silhouette normal in size and configuration. Aorta is mildly uncoiled. No mediastinal or hilar masses or evidence of adenopathy.  There there is increased peribronchial opacity in the left lower lobe when compared to the prior chest radiograph. This is accentuated by lower lung volumes and some rotation. However, could reflect acute bronchitis in the proper clinical setting. The remainder of the lungs is clear. No pleural effusion or pneumothorax.  Bony thorax is demineralized but grossly intact.  IMPRESSION: 1. Left lower lobe opacity is most likely chronic but could reflect acute bronchitis if there are consistent clinical symptoms. No evidence of pneumonia or pulmonary edema.   Electronically Signed   By: Lajean Manes M.D.   On: 11/21/2014 20:23    CBC  Recent Labs Lab 11/21/14 1828 11/21/14 2328 11/22/14 0555  WBC 5.6 8.7 6.7  HGB 14.3 13.6 12.8  HCT 44.1 42.8 41.3  PLT 220 237 212  MCV 91.3 91.6 92.2  MCH 29.6 29.1 28.6  MCHC 32.4 31.8 31.0  RDW 13.9 14.1 14.2  LYMPHSABS 1.5  --  1.8  MONOABS 0.5  --  0.4  EOSABS 0.1  --  0.2  BASOSABS 0.0  --  0.0    Chemistries   Recent Labs Lab 11/21/14 1828 11/21/14 2328 11/22/14 0555  NA 142  --  143  K 3.5  --  3.7  CL 103  --  104  CO2 28  --  31  GLUCOSE 91  --  82  BUN 21  --  17  CREATININE 0.93 0.83 0.80  CALCIUM 9.1  --  8.7  AST 18  --  15  ALT 9  --  9  ALKPHOS 62  --  60  BILITOT 0.4  --  0.5   ------------------------------------------------------------------------------------------------------------------ estimated creatinine clearance is 49.6 mL/min (by C-G formula  based on Cr of 0.8). ------------------------------------------------------------------------------------------------------------------ No results for input(s): HGBA1C in the last 72 hours. ------------------------------------------------------------------------------------------------------------------ No results for input(s): CHOL, HDL, LDLCALC, TRIG, CHOLHDL, LDLDIRECT in the last 72 hours. ------------------------------------------------------------------------------------------------------------------  No results for input(s): TSH, T4TOTAL, T3FREE, THYROIDAB in the last 72 hours.  Invalid input(s): FREET3 ------------------------------------------------------------------------------------------------------------------ No results for input(s): VITAMINB12, FOLATE, FERRITIN, TIBC, IRON, RETICCTPCT in the last 72 hours.  Coagulation profile No results for input(s): INR, PROTIME in the last 168 hours.  No results for input(s): DDIMER in the last 72 hours.  Cardiac Enzymes  Recent Labs Lab 11/21/14 2328 11/22/14 0555 11/22/14 1147  TROPONINI <0.03 <0.03 <0.03   ------------------------------------------------------------------------------------------------------------------ Invalid input(s): POCBNP     Time Spent in minutes   25 minutes   ELGERGAWY, DAWOOD M.D on 11/22/2014 at 3:28 PM  Between 7am to 7pm - Pager - [phone]  After 7pm go to [email or web address] - password [email or web address]  And look for the night coverage person covering for me after hours  [facility] Office  [phone]   **Disclaimer: This note may have been dictated with voice recognition software. Similar sounding words can inadvertently be transcribed and this note may contain transcription errors which may not have been corrected upon publication of note.**

## 2014-11-22 NOTE — Progress Notes (Signed)
  Echocardiogram 2D Echocardiogram has been performed.  Taylor Kramer 11/22/2014, 3:40 PM

## 2014-11-23 DIAGNOSIS — Z8739 Personal history of other diseases of the musculoskeletal system and connective tissue: Secondary | ICD-10-CM

## 2014-11-23 LAB — CBC
HEMATOCRIT: 40.3 % (ref 36.0–46.0)
Hemoglobin: 12.9 g/dL (ref 12.0–15.0)
MCH: 29.2 pg (ref 26.0–34.0)
MCHC: 32 g/dL (ref 30.0–36.0)
MCV: 91.2 fL (ref 78.0–100.0)
Platelets: 209 10*3/uL (ref 150–400)
RBC: 4.42 MIL/uL (ref 3.87–5.11)
RDW: 14 % (ref 11.5–15.5)
WBC: 5.9 10*3/uL (ref 4.0–10.5)

## 2014-11-23 LAB — BASIC METABOLIC PANEL
Anion gap: 6 (ref 5–15)
BUN: 16 mg/dL (ref 6–23)
CO2: 30 mmol/L (ref 19–32)
Calcium: 8.3 mg/dL — ABNORMAL LOW (ref 8.4–10.5)
Chloride: 107 mmol/L (ref 96–112)
Creatinine, Ser: 0.87 mg/dL (ref 0.50–1.10)
GFR calc Af Amer: 70 mL/min — ABNORMAL LOW (ref >90)
GFR calc non Af Amer: 61 mL/min — ABNORMAL LOW (ref >90)
Glucose, Bld: 89 mg/dL (ref 70–99)
Potassium: 3.6 mmol/L (ref 3.5–5.1)
Sodium: 143 mmol/L (ref 135–145)

## 2014-11-23 LAB — URINE CULTURE: Colony Count: >=100000

## 2014-11-23 MED ORDER — AMOXICILLIN 500 MG PO CAPS
500.0000 mg | ORAL_CAPSULE | Freq: Three times a day (TID) | ORAL | Status: DC
  Administered 2014-11-23 – 2014-11-24 (×4): 500 mg via ORAL
  Filled 2014-11-23 (×4): qty 1

## 2014-11-23 NOTE — Clinical Social Work Note (Signed)
Clinical Social Work Assessment  Patient Details  Name: Taylor Kramer MRN: [id number] Date of Birth: [birth year]  Date of referral:  11/23/14               Reason for consult:  Facility Placement                Permission sought to share information with:  Facility Art therapist granted to share information::  Yes, Verbal Permission Granted  Name::        Agency::     Relationship::     Contact Information:     Housing/Transportation Living arrangements for the past 2 months:  [place] of Information:  Patient, Adult Children, Facility Patient Interpreter Needed:  None Criminal Activity/Legal Involvement Pertinent to Current Situation/Hospitalization:    Significant Relationships:  Adult Children Lives with:  Facility Resident Do you feel safe going back to the place where you live?    Need for family participation in patient care:  Yes (Comment)  Care giving concerns:  CSW received consult that patient was admitted from [facility].    Social Worker assessment / plan:  CSW met with patient & reviewed PT evaluation recommending SNF if ALF is unable to manage patient.   Employment status:  Disabled (Comment on whether or not currently receiving Disability) Insurance information:  Medicare PT Recommendations:  [place] / Referral to community resources:  [place]  Patient/Family's Response to care:  Patient states that she would prefer to return to ALF, awaiting call back from [place] at ALF to see if they would be able to manage patient. CSW provided patient with SNF bed offers & left message for patient's son, [name] (ph#: [phone]).   Patient/Family's Understanding of and Emotional Response to Diagnosis, Current Treatment, and Prognosis:  Patient was accepting of possible need for SNF at discharge. Patient has dx: UTI though is not showing signs of confusion.   Emotional  Assessment Appearance:  Appears stated age Attitude/Demeanor/Rapport:    Affect (typically observed):  Calm, Pleasant, Accepting Orientation:  Oriented to Self, Oriented to Place, Oriented to  Time, Oriented to Situation Alcohol / Substance use:    Psych involvement (Current and /or in the community):  No (Comment)  Discharge Needs  Concerns to be addressed:  Discharge Planning Concerns Readmission within the last 30 days:    Current discharge risk:    Barriers to Discharge:      Standley Brooking, LCSW 11/23/2014, 2:21 PM

## 2014-11-23 NOTE — Progress Notes (Signed)
Patient Demographics  Taylor Kramer, is a 81 y.o. female, DOB - September 18, 1931, ZYS:[id number]  Admit date - 11/21/2014   Admitting Physician Rise Patience, MD  Outpatient Primary MD for the patient is PROVIDER NOT [place]  LOS - 1   Chief Complaint  Patient presents with  . Weakness        Subjective:   Sitting on chair at bedside, denies any significant changes. Wants to eat regular diet.  Assessment & Plan    Principal Problem:   Near syncope Active Problems:   UTI (lower urinary tract infection)   Hypertension   History of CREST syndrome   Acute UTI  Presyncope - Patient denies any loss of consciousness, this is most likely related to UTI and clinical dehydration. - Echocardiogram showed normal wall motion, grade 1 diastolic dysfunction and LVEF of 65-70%. - Cardiac enzymes negative 3 -I will discontinue cardiac monitor, this is likely secondary to dehydration/UTI.  UTI - Patient was started on Rocephin, cultures showed obese streptococcus. - Will change to amoxicillin.  Hypertension - Continue with Norvasc  History of crest syndrome - Continue to follow as an outpatient   Code Status: Full  Family Communication: None at bedside  Disposition Plan: PT recommended SNF, patient prefers to go back to her ALF.   Procedures  None   Consults   None   Medications  Scheduled Meds: . amLODipine  2.5 mg Oral Daily  . cefTRIAXone (ROCEPHIN)  IV  1 g Intravenous Q24H  . cholecalciferol  1,000 Units Oral Daily  . enoxaparin (LOVENOX) injection  40 mg Subcutaneous QHS  . fluticasone  2 spray Each Nare Daily  . loratadine  10 mg Oral Daily  . pantoprazole  40 mg Oral Daily  . polycarbophil  625 mg Oral Daily  . sodium chloride  3 mL Intravenous Q12H  . sodium chloride  3 mL Intravenous Q12H  . zolpidem  5 mg Oral QHS    Continuous Infusions:  PRN Meds:.acetaminophen **OR** acetaminophen, bisacodyl, ondansetron **OR** ondansetron (ZOFRAN) IV, polyethylene glycol  DVT Prophylaxis  Lovenox   Lab Results  Component Value Date   PLT 209 11/23/2014    Antibiotics    Anti-infectives    Start     Dose/Rate Route Frequency Ordered Stop   11/22/14 2300  cefTRIAXone (ROCEPHIN) 1 g in dextrose 5 % 50 mL IVPB - Premix     1 g 100 mL/hr over 30 Minutes Intravenous Every 24 hours 11/21/14 2341     11/21/14 2245  cefTRIAXone (ROCEPHIN) 1 g in dextrose 5 % 50 mL IVPB     1 g 100 mL/hr over 30 Minutes Intravenous  Once 11/21/14 2236 11/21/14 2336          Objective:   Filed Vitals:   11/22/14 1415 11/22/14 2126 11/23/14 0537 11/23/14 1359  BP: 125/69 139/71 145/69 135/73  Pulse: 94 101 81 95  Temp: 99.4 F (37.4 C) 98.2 F (36.8 C) 97.9 F (36.6 C) 98.4 F (36.9 C)  TempSrc: Oral Oral Oral Oral  Resp: 20 20 20 18   Height:      Weight:      SpO2: 99% 95% 96% 95%    Wt Readings from Last 3 Encounters:  11/21/14 56.972 kg (125 lb 9.6 oz)     Intake/Output Summary (Last 24 hours) at 11/23/14 1401 Last data filed at 11/23/14 1300  Gross per 24 hour  Intake   1010 ml  Output    502 ml  Net    508 ml     Physical Exam General: Alert and awake, oriented x3, not in any acute distress. HEENT: anicteric sclera, pupils reactive to light and accommodation, EOMI CVS: S1-S2 clear, no murmur rubs or gallops Chest: clear to auscultation bilaterally, no wheezing, rales or rhonchi Abdomen: soft nontender, nondistended, normal bowel sounds, no organomegaly Extremities: no cyanosis, clubbing or edema noted bilaterally Neuro: Cranial nerves II-XII intact, no focal neurological deficit   Data Review   Micro Results Recent Results (from the past 240 hour(s))  Urine culture     Status: None   Collection Time: 11/21/14  8:46 PM  Result Value Ref Range Status   Specimen Description URINE,  CATHETERIZED  Final   Special Requests NONE  Final   Colony Count   Final    >=100,000 COLONIES/ML Performed at [facility]    Culture   Final    GROUP B STREP(S.AGALACTIAE)ISOLATED Note: TESTING AGAINST S. AGALACTIAE NOT ROUTINELY PERFORMED DUE TO PREDICTABILITY OF AMP/PEN/VAN SUSCEPTIBILITY. Performed at [facility]    Report Status 11/23/2014 FINAL  Final  MRSA PCR Screening     Status: None   Collection Time: 11/22/14 12:18 AM  Result Value Ref Range Status   MRSA by PCR NEGATIVE NEGATIVE Final    Comment:        The GeneXpert MRSA Assay (FDA approved for NASAL specimens only), is one component of a comprehensive MRSA colonization surveillance program. It is not intended to diagnose MRSA infection nor to guide or monitor treatment for MRSA infections.     Radiology Reports Dg Chest 2 View  11/21/2014   CLINICAL DATA:  Pt c/o weakness, sob, back pain at lacerations across left dorsum s/p fall today at her nursing home. Pt states she just "fell out" and has been unable to recover energy.  EXAM: CHEST  2 VIEW  COMPARISON:  08/09/2010  FINDINGS: Cardiac silhouette normal in size and configuration. Aorta is mildly uncoiled. No mediastinal or hilar masses or evidence of adenopathy.  There there is increased peribronchial opacity in the left lower lobe when compared to the prior chest radiograph. This is accentuated by lower lung volumes and some rotation. However, could reflect acute bronchitis in the proper clinical setting. The remainder of the lungs is clear. No pleural effusion or pneumothorax.  Bony thorax is demineralized but grossly intact.  IMPRESSION: 1. Left lower lobe opacity is most likely chronic but could reflect acute bronchitis if there are consistent clinical symptoms. No evidence of pneumonia or pulmonary edema.   Electronically Signed   By: Lajean Manes M.D.   On: 11/21/2014 20:23    CBC  Recent Labs Lab 11/21/14 1828 11/21/14 2328  11/22/14 0555 11/23/14 0519  WBC 5.6 8.7 6.7 5.9  HGB 14.3 13.6 12.8 12.9  HCT 44.1 42.8 41.3 40.3  PLT 220 237 212 209  MCV 91.3 91.6 92.2 91.2  MCH 29.6 29.1 28.6 29.2  MCHC 32.4 31.8 31.0 32.0  RDW 13.9 14.1 14.2 14.0  LYMPHSABS 1.5  --  1.8  --   MONOABS 0.5  --  0.4  --   EOSABS 0.1  --  0.2  --   BASOSABS 0.0  --  0.0  --  Chemistries   Recent Labs Lab 11/21/14 1828 11/21/14 2328 11/22/14 0555 11/23/14 0519  NA 142  --  143 143  K 3.5  --  3.7 3.6  CL 103  --  104 107  CO2 28  --  31 30  GLUCOSE 91  --  82 89  BUN 21  --  17 16  CREATININE 0.93 0.83 0.80 0.87  CALCIUM 9.1  --  8.7 8.3*  AST 18  --  15  --   ALT 9  --  9  --   ALKPHOS 62  --  60  --   BILITOT 0.4  --  0.5  --    ------------------------------------------------------------------------------------------------------------------ estimated creatinine clearance is 45.6 mL/min (by C-G formula based on Cr of 0.87). ------------------------------------------------------------------------------------------------------------------ No results for input(s): HGBA1C in the last 72 hours. ------------------------------------------------------------------------------------------------------------------ No results for input(s): CHOL, HDL, LDLCALC, TRIG, CHOLHDL, LDLDIRECT in the last 72 hours. ------------------------------------------------------------------------------------------------------------------ No results for input(s): TSH, T4TOTAL, T3FREE, THYROIDAB in the last 72 hours.  Invalid input(s): FREET3 ------------------------------------------------------------------------------------------------------------------ No results for input(s): VITAMINB12, FOLATE, FERRITIN, TIBC, IRON, RETICCTPCT in the last 72 hours.  Coagulation profile No results for input(s): INR, PROTIME in the last 168 hours.  No results for input(s): DDIMER in the last 72 hours.  Cardiac Enzymes  Recent Labs Lab  11/21/14 2328 11/22/14 0555 11/22/14 1147  TROPONINI <0.03 <0.03 <0.03   ------------------------------------------------------------------------------------------------------------------ Invalid input(s): POCBNP     Time Spent in minutes   25 minutes   Lanise Mergen A M.D on 11/23/2014 at 2:01 PM  Between 7am to 7pm - Pager - [phone]  After 7pm go to [email or web address] - password [email or web address]  And look for the night coverage person covering for me after hours  [facility] Office  [phone]

## 2014-11-23 NOTE — Clinical Social Work Placement (Signed)
   CLINICAL SOCIAL WORK PLACEMENT  NOTE  Date:  11/23/2014  Patient Details  Name: Taylor Kramer MRN: [id number] Date of Birth: 1931-10-16  Clinical Social Work is seeking post-discharge placement for this patient at the [place] level of care (*CSW will initial, date and re-position this form in  chart as items are completed):  Yes   Patient/family provided with [place] Work Department's list of facilities offering this level of care within the geographic area requested by the patient (or if unable, by the patient's family).  Yes   Patient/family informed of their freedom to choose among providers that offer the needed level of care, that participate in Medicare, Medicaid or managed care program needed by the patient, have an available bed and are willing to accept the patient.  Yes   Patient/family informed of [facility]'s ownership interest in [facility] and [facility], as well as of the fact that they are under no obligation to receive care at these facilities.  PASRR submitted to EDS on       PASRR number received on       Existing PASRR number confirmed on       FL2 transmitted to all facilities in geographic area requested by pt/family on 11/23/14     FL2 transmitted to all facilities within larger geographic area on       Patient informed that his/her managed care company has contracts with or will negotiate with certain facilities, including the following:        Yes   Patient/family informed of bed offers received.  Patient chooses bed at       Physician recommends and patient chooses bed at      Patient to be transferred to   on  .  Patient to be transferred to facility by       Patient family notified on   of transfer.  Name of family member notified:        PHYSICIAN       Additional Comment:    _______________________________________________ Standley Brooking, LCSW 11/23/2014, 2:25 PM

## 2014-11-23 NOTE — Progress Notes (Signed)
ANTIBIOTIC CONSULT NOTE - Follow Up  Pharmacy Consult for Ceftriaxone Indication: UTI  No Known Allergies  Patient Measurements: Height: 5\' 7"  (170.2 cm) Weight: 125 lb 9.6 oz (56.972 kg) IBW/kg (Calculated) : 61.6  Vital Signs: Temp: 97.9 F (36.6 C) (04/27 0537) Temp Source: Oral (04/27 0537) BP: 145/69 mmHg (04/27 0537) Pulse Rate: 81 (04/27 0537) Intake/Output from previous day: 04/26 0701 - 04/27 0700 In: 1010 [P.O.:960; IV Piggyback:50] Out: 502 [Urine:500; Stool:2] Intake/Output from this shift:    Labs:  Recent Labs  11/21/14 2328 11/22/14 0555 11/23/14 0519  WBC 8.7 6.7 5.9  HGB 13.6 12.8 12.9  PLT 237 212 209  CREATININE 0.83 0.80 0.87   Estimated Creatinine Clearance: 45.6 mL/min (by C-G formula based on Cr of 0.87). No results for input(s): VANCOTROUGH, VANCOPEAK, VANCORANDOM, GENTTROUGH, GENTPEAK, GENTRANDOM, TOBRATROUGH, TOBRAPEAK, TOBRARND, AMIKACINPEAK, AMIKACINTROU, AMIKACIN in the last 72 hours.   Microbiology: Recent Results (from the past 720 hour(s))  Urine culture     Status: None   Collection Time: 11/21/14  8:46 PM  Result Value Ref Range Status   Specimen Description URINE, CATHETERIZED  Final   Special Requests NONE  Final   Colony Count   Final    >=100,000 COLONIES/ML Performed at [facility]    Culture   Final    GROUP B STREP(S.AGALACTIAE)ISOLATED Note: TESTING AGAINST S. AGALACTIAE NOT ROUTINELY PERFORMED DUE TO PREDICTABILITY OF AMP/PEN/VAN SUSCEPTIBILITY. Performed at [facility]    Report Status 11/23/2014 FINAL  Final  MRSA PCR Screening     Status: None   Collection Time: 11/22/14 12:18 AM  Result Value Ref Range Status   MRSA by PCR NEGATIVE NEGATIVE Final    Comment:        The GeneXpert MRSA Assay (FDA approved for NASAL specimens only), is one component of a comprehensive MRSA colonization surveillance program. It is not intended to diagnose MRSA infection nor to guide or monitor treatment  for MRSA infections.     Medical History: Past Medical History  Diagnosis Date  . Hypertension   . Osteoporosis   . CREST syndrome   . Insomnia   . Neuropathy     Medications:  Scheduled:  . amLODipine  2.5 mg Oral Daily  . cefTRIAXone (ROCEPHIN)  IV  1 g Intravenous Q24H  . cholecalciferol  1,000 Units Oral Daily  . enoxaparin (LOVENOX) injection  40 mg Subcutaneous QHS  . fluticasone  2 spray Each Nare Daily  . loratadine  10 mg Oral Daily  . pantoprazole  40 mg Oral Daily  . polycarbophil  625 mg Oral Daily  . sodium chloride  3 mL Intravenous Q12H  . sodium chloride  3 mL Intravenous Q12H  . zolpidem  5 mg Oral QHS   Infusions:   Assessment: 81 yr female s/p fall.  Pharmacy consulted to dose Ceftriaxone for UTI.  CrCl~45 ml/min.  4/25 >>ceftriaxone >>  4/25 urine: >100K Group B Strept  Goal of Therapy:  Eradication of infection  Plan:  Ceftriaxone 1gm IV q24h.  No further adjustment needed. Pharmacy will sign off.  Re-consult if needed.  In preparation for discharge, could consider Amoxil 500 mg TID x 7 days (for total 10 day abx course) if need to treat GBS in urine.  Hershal Coria, PharmD 11/23/2014,9:28 AM

## 2014-11-24 MED ORDER — AMOXICILLIN 500 MG PO CAPS: 500.0000 mg | ORAL_CAPSULE | Freq: Three times a day (TID) | ORAL | Status: DC

## 2014-11-24 NOTE — Discharge Summary (Signed)
Physician Discharge Summary  Taylor Kramer YKD:[id number] DOB: 1931-09-25 DOA: 11/21/2014  PCP: PROVIDER NOT IN SYSTEM  Admit date: 11/21/2014 Discharge date: 11/24/2014  Time spent: 40 minutes  Recommendations for Outpatient Follow-up:  1. Back to [facility]F.  Discharge Diagnoses:  Principal Problem:   Near syncope Active Problems:   UTI (lower urinary tract infection)   Hypertension   History of CREST syndrome   Acute UTI   Discharge Condition: Stable  Diet recommendation: Heart Healthy  Filed Weights   11/21/14 2330  Weight: 56.972 kg (125 lb 9.6 oz)    History of present illness:  Taylor Kramer is a 81 y.o. female with history of crest syndrome, hypertension, neuropathy with difficulty talking who is usually on wheelchair and would be able to ambulate to the bathroom with help of walker, lives at an ALF had a sudden fall while trying to walk to the bathroom. Patient states she felt something warm going up from her epigastric area to the chest following which he suddenly lost control and fell. Did not lose consciousness. Did not have any chest pain shortness of breath nausea vomiting abdominal pain diarrhea fever chills or any productive cough. After the fall patient was not able to get up herself. Patient was brought to the ER and in the ER chest x-ray was showing non-specific findings and EKG was a normal sinus rhythm with nonspecific T-wave changes. Cardiac markers were negative. UA showing features of UTI. Patient has been elevated for further observation. Patient states 2 years ago she had palpitations and at the time patient was referred to cardiologist and workup at that time was negative. Presently denies any palpitations. [name] states when he saw her first after the fall patient's lower extremities were looking cyanotic.   Hospital Course:   Presyncope - Patient denies any loss of consciousness, this is most likely related to UTI and clinical dehydration. -  Echocardiogram showed normal wall motion, grade 1 diastolic dysfunction and LVEF of 65-70%. - Cardiac enzymes negative 3 - Cardiac monitor discontinued, her fall and syncopal episode is likely related to dehydration and UTI. - Patient seen by PT and recommended SNF, patient elected to go back to her ALF.  GBS UTI - Patient was started on Rocephin, cultures showed group B streptococcus. - Discharged on 5 more days of amoxicillin.  Hypertension - Continue with Norvasc  History of crest syndrome - Continue to follow as an outpatient  Procedures:  None  Consultations:  None  Discharge Exam: Filed Vitals:   11/24/14 0558  BP: 145/76  Pulse: 87  Temp: 98.2 F (36.8 C)  Resp: 16   General: Alert and awake, oriented x3, not in any acute distress. HEENT: anicteric sclera, pupils reactive to light and accommodation, EOMI CVS: S1-S2 clear, no murmur rubs or gallops Chest: clear to auscultation bilaterally, no wheezing, rales or rhonchi Abdomen: soft nontender, nondistended, normal bowel sounds, no organomegaly Extremities: no cyanosis, clubbing or edema noted bilaterally Neuro: Cranial nerves II-XII intact, no focal neurological deficits  Discharge Instructions   Discharge Instructions    Diet - low sodium heart healthy    Complete by:  As directed      Increase activity slowly    Complete by:  As directed           Current Discharge Medication List    START taking these medications   Details  amoxicillin (AMOXIL) 500 MG capsule Take 1 capsule (500 mg total) by mouth every 8 (eight) hours. Qty: 15  capsule, Refills: 0      CONTINUE these medications which have NOT CHANGED   Details  acetaminophen (TYLENOL) 500 MG tablet Take 500 mg by mouth every 6 (six) hours as needed for moderate pain.    amLODipine (NORVASC) 2.5 MG tablet Take 2.5 mg by mouth daily.    bisacodyl (DULCOLAX) 5 MG EC tablet Take 10 mg by mouth daily as needed for mild constipation or moderate  constipation.    cholecalciferol (VITAMIN D) 1000 UNITS tablet Take 1,000 Units by mouth daily.    fluticasone (FLONASE) 50 MCG/ACT nasal spray Place 2 sprays into both nostrils daily.    loratadine (CLARITIN) 10 MG tablet Take 10 mg by mouth daily.    polyethylene glycol (MIRALAX / GLYCOLAX) packet Take 17 g by mouth daily as needed for mild constipation.    psyllium (KONSYL) 0.52 G capsule Take 0.52 g by mouth daily.    alendronate (FOSAMAX) 70 MG tablet Take 70 mg by mouth once a week. Take with a full glass of water on an empty stomach on Fridays.    zolpidem (AMBIEN) 10 MG tablet Take 10 mg by mouth at bedtime.        No Known Allergies    The results of significant diagnostics from this hospitalization (including imaging, microbiology, ancillary and laboratory) are listed below for reference.    Significant Diagnostic Studies: Dg Chest 2 View  11/21/2014   CLINICAL DATA:  Pt c/o weakness, sob, back pain at lacerations across left dorsum s/p fall today at her nursing home. Pt states she just "fell out" and has been unable to recover energy.  EXAM: CHEST  2 VIEW  COMPARISON:  08/09/2010  FINDINGS: Cardiac silhouette normal in size and configuration. Aorta is mildly uncoiled. No mediastinal or hilar masses or evidence of adenopathy.  There there is increased peribronchial opacity in the left lower lobe when compared to the prior chest radiograph. This is accentuated by lower lung volumes and some rotation. However, could reflect acute bronchitis in the proper clinical setting. The remainder of the lungs is clear. No pleural effusion or pneumothorax.  Bony thorax is demineralized but grossly intact.  IMPRESSION: 1. Left lower lobe opacity is most likely chronic but could reflect acute bronchitis if there are consistent clinical symptoms. No evidence of pneumonia or pulmonary edema.   Electronically Signed   By: Lajean Manes M.D.   On: 11/21/2014 20:23    Microbiology: Recent Results  (from the past 240 hour(s))  Urine culture     Status: None   Collection Time: 11/21/14  8:46 PM  Result Value Ref Range Status   Specimen Description URINE, CATHETERIZED  Final   Special Requests NONE  Final   Colony Count   Final    >=100,000 COLONIES/ML Performed at [facility]    Culture   Final    GROUP B STREP(S.AGALACTIAE)ISOLATED Note: TESTING AGAINST S. AGALACTIAE NOT ROUTINELY PERFORMED DUE TO PREDICTABILITY OF AMP/PEN/VAN SUSCEPTIBILITY. Performed at [facility]    Report Status 11/23/2014 FINAL  Final  MRSA PCR Screening     Status: None   Collection Time: 11/22/14 12:18 AM  Result Value Ref Range Status   MRSA by PCR NEGATIVE NEGATIVE Final    Comment:        The GeneXpert MRSA Assay (FDA approved for NASAL specimens only), is one component of a comprehensive MRSA colonization surveillance program. It is not intended to diagnose MRSA infection nor to guide or monitor treatment for  MRSA infections.      Labs: Basic Metabolic Panel:  Recent Labs Lab 11/21/14 1828 11/21/14 2328 11/22/14 0555 11/23/14 0519  NA 142  --  143 143  K 3.5  --  3.7 3.6  CL 103  --  104 107  CO2 28  --  31 30  GLUCOSE 91  --  82 89  BUN 21  --  17 16  CREATININE 0.93 0.83 0.80 0.87  CALCIUM 9.1  --  8.7 8.3*   Liver Function Tests:  Recent Labs Lab 11/21/14 1828 11/22/14 0555  AST 18 15  ALT 9 9  ALKPHOS 62 60  BILITOT 0.4 0.5  PROT 7.0 6.0  ALBUMIN 3.8 3.3*   No results for input(s): LIPASE, AMYLASE in the last 168 hours. No results for input(s): AMMONIA in the last 168 hours. CBC:  Recent Labs Lab 11/21/14 1828 11/21/14 2328 11/22/14 0555 11/23/14 0519  WBC 5.6 8.7 6.7 5.9  NEUTROABS 3.5  --  4.2  --   HGB 14.3 13.6 12.8 12.9  HCT 44.1 42.8 41.3 40.3  MCV 91.3 91.6 92.2 91.2  PLT 220 237 212 209   Cardiac Enzymes:  Recent Labs Lab 11/21/14 2328 11/22/14 0555 11/22/14 1147  TROPONINI <0.03 <0.03 <0.03   BNP: BNP (last 3  results)  Recent Labs  11/21/14 2359  BNP 41.1    ProBNP (last 3 results) No results for input(s): PROBNP in the last 8760 hours.  CBG: No results for input(s): GLUCAP in the last 168 hours.     Signed:  Thurston Brendlinger A  [facility] 11/24/2014, 11:01 AM

## 2014-11-24 NOTE — Care Management Note (Signed)
    Page 1 of 1   11/24/2014     1:53:43 PM CARE MANAGEMENT NOTE 11/24/2014  Patient:  Taylor Kramer, Taylor Kramer   Account Number:  [id number]  Date Initiated:  11/24/2014  Documentation initiated by:  Dessa Phi  Subjective/Objective Assessment:   81 y/o f admitted w/near syncope.     Action/Plan:   from alf   Anticipated DC Date:  11/24/2014   Anticipated DC Plan:  [facility]SISTED LIVING / [place]  CM consult      Choice offered to / List presented to:          [facility] arranged  HH-2 PT      Status of service:  Completed, signed off Medicare Important Message given?  YES (If response is "NO", the following Medicare IM given date fields will be blank) Date Medicare IM given:  11/24/2014 Medicare IM given by:  [facility] Date Additional Medicare IM given:   Additional Medicare IM given by:    Discharge Disposition:  [place]  Per UR Regulation:  Reviewed for med. necessity/level of care/duration of stay  If discussed at [place] of Stay Meetings, dates discussed:    Comments:  11/24/14 Dessa Phi RN BSN NCM [phone] d/c back to alf-they have their own hhc.HHPT/HHOT on fl2.

## 2014-11-24 NOTE — Progress Notes (Signed)
Patient is set to discharge back to [facility] ALF today. CSW confirmed with [facility] @ ALF that patient can return. Patient & son, Taylor Kramer aware. Discharge packet given to RN, Deidre Ala. Son, [name] to transport patient to ALF.     Raynaldo Opitz, [facility] Clinical Social Worker cell #: [phone]

## 2014-11-24 NOTE — Progress Notes (Signed)
Report given to [place], Therapist, sports, from [place]. Patient is ready for d/c. [name] [name] is aware and will be transporting patient back to [place]. Will continue to monitor until d/c.

## 2014-11-28 DIAGNOSIS — G629 Polyneuropathy, unspecified: Secondary | ICD-10-CM | POA: Diagnosis not present

## 2014-11-28 DIAGNOSIS — N390 Urinary tract infection, site not specified: Secondary | ICD-10-CM | POA: Diagnosis not present

## 2014-11-28 DIAGNOSIS — Z9181 History of falling: Secondary | ICD-10-CM | POA: Diagnosis not present

## 2014-11-28 DIAGNOSIS — B951 Streptococcus, group B, as the cause of diseases classified elsewhere: Secondary | ICD-10-CM | POA: Diagnosis not present

## 2014-11-28 DIAGNOSIS — I10 Essential (primary) hypertension: Secondary | ICD-10-CM | POA: Diagnosis not present

## 2014-11-30 DIAGNOSIS — Z9181 History of falling: Secondary | ICD-10-CM | POA: Diagnosis not present

## 2014-11-30 DIAGNOSIS — G629 Polyneuropathy, unspecified: Secondary | ICD-10-CM | POA: Diagnosis not present

## 2014-11-30 DIAGNOSIS — I10 Essential (primary) hypertension: Secondary | ICD-10-CM | POA: Diagnosis not present

## 2014-11-30 DIAGNOSIS — B951 Streptococcus, group B, as the cause of diseases classified elsewhere: Secondary | ICD-10-CM | POA: Diagnosis not present

## 2014-11-30 DIAGNOSIS — N390 Urinary tract infection, site not specified: Secondary | ICD-10-CM | POA: Diagnosis not present

## 2014-12-01 DIAGNOSIS — Z09 Encounter for follow-up examination after completed treatment for conditions other than malignant neoplasm: Secondary | ICD-10-CM | POA: Diagnosis not present

## 2014-12-01 DIAGNOSIS — N390 Urinary tract infection, site not specified: Secondary | ICD-10-CM | POA: Diagnosis not present

## 2014-12-01 DIAGNOSIS — I10 Essential (primary) hypertension: Secondary | ICD-10-CM | POA: Diagnosis not present

## 2014-12-01 DIAGNOSIS — F5101 Primary insomnia: Secondary | ICD-10-CM | POA: Diagnosis not present

## 2014-12-02 DIAGNOSIS — Z9181 History of falling: Secondary | ICD-10-CM | POA: Diagnosis not present

## 2014-12-02 DIAGNOSIS — I10 Essential (primary) hypertension: Secondary | ICD-10-CM | POA: Diagnosis not present

## 2014-12-02 DIAGNOSIS — G629 Polyneuropathy, unspecified: Secondary | ICD-10-CM | POA: Diagnosis not present

## 2014-12-02 DIAGNOSIS — N390 Urinary tract infection, site not specified: Secondary | ICD-10-CM | POA: Diagnosis not present

## 2014-12-02 DIAGNOSIS — B951 Streptococcus, group B, as the cause of diseases classified elsewhere: Secondary | ICD-10-CM | POA: Diagnosis not present

## 2014-12-05 DIAGNOSIS — B951 Streptococcus, group B, as the cause of diseases classified elsewhere: Secondary | ICD-10-CM | POA: Diagnosis not present

## 2014-12-05 DIAGNOSIS — N390 Urinary tract infection, site not specified: Secondary | ICD-10-CM | POA: Diagnosis not present

## 2014-12-05 DIAGNOSIS — Z9181 History of falling: Secondary | ICD-10-CM | POA: Diagnosis not present

## 2014-12-05 DIAGNOSIS — I10 Essential (primary) hypertension: Secondary | ICD-10-CM | POA: Diagnosis not present

## 2014-12-05 DIAGNOSIS — G629 Polyneuropathy, unspecified: Secondary | ICD-10-CM | POA: Diagnosis not present

## 2014-12-07 DIAGNOSIS — N390 Urinary tract infection, site not specified: Secondary | ICD-10-CM | POA: Diagnosis not present

## 2014-12-07 DIAGNOSIS — G629 Polyneuropathy, unspecified: Secondary | ICD-10-CM | POA: Diagnosis not present

## 2014-12-07 DIAGNOSIS — Z9181 History of falling: Secondary | ICD-10-CM | POA: Diagnosis not present

## 2014-12-07 DIAGNOSIS — I10 Essential (primary) hypertension: Secondary | ICD-10-CM | POA: Diagnosis not present

## 2014-12-07 DIAGNOSIS — B951 Streptococcus, group B, as the cause of diseases classified elsewhere: Secondary | ICD-10-CM | POA: Diagnosis not present

## 2014-12-12 DIAGNOSIS — G629 Polyneuropathy, unspecified: Secondary | ICD-10-CM | POA: Diagnosis not present

## 2014-12-12 DIAGNOSIS — I10 Essential (primary) hypertension: Secondary | ICD-10-CM | POA: Diagnosis not present

## 2014-12-12 DIAGNOSIS — Z9181 History of falling: Secondary | ICD-10-CM | POA: Diagnosis not present

## 2014-12-12 DIAGNOSIS — B951 Streptococcus, group B, as the cause of diseases classified elsewhere: Secondary | ICD-10-CM | POA: Diagnosis not present

## 2014-12-12 DIAGNOSIS — N390 Urinary tract infection, site not specified: Secondary | ICD-10-CM | POA: Diagnosis not present

## 2014-12-15 DIAGNOSIS — B951 Streptococcus, group B, as the cause of diseases classified elsewhere: Secondary | ICD-10-CM | POA: Diagnosis not present

## 2014-12-15 DIAGNOSIS — I10 Essential (primary) hypertension: Secondary | ICD-10-CM | POA: Diagnosis not present

## 2014-12-15 DIAGNOSIS — G629 Polyneuropathy, unspecified: Secondary | ICD-10-CM | POA: Diagnosis not present

## 2014-12-15 DIAGNOSIS — Z9181 History of falling: Secondary | ICD-10-CM | POA: Diagnosis not present

## 2014-12-15 DIAGNOSIS — N390 Urinary tract infection, site not specified: Secondary | ICD-10-CM | POA: Diagnosis not present

## 2014-12-20 DIAGNOSIS — Z9181 History of falling: Secondary | ICD-10-CM | POA: Diagnosis not present

## 2014-12-20 DIAGNOSIS — G629 Polyneuropathy, unspecified: Secondary | ICD-10-CM | POA: Diagnosis not present

## 2014-12-20 DIAGNOSIS — B951 Streptococcus, group B, as the cause of diseases classified elsewhere: Secondary | ICD-10-CM | POA: Diagnosis not present

## 2014-12-20 DIAGNOSIS — I10 Essential (primary) hypertension: Secondary | ICD-10-CM | POA: Diagnosis not present

## 2014-12-20 DIAGNOSIS — N390 Urinary tract infection, site not specified: Secondary | ICD-10-CM | POA: Diagnosis not present

## 2014-12-23 DIAGNOSIS — G629 Polyneuropathy, unspecified: Secondary | ICD-10-CM | POA: Diagnosis not present

## 2014-12-23 DIAGNOSIS — B951 Streptococcus, group B, as the cause of diseases classified elsewhere: Secondary | ICD-10-CM | POA: Diagnosis not present

## 2014-12-23 DIAGNOSIS — I10 Essential (primary) hypertension: Secondary | ICD-10-CM | POA: Diagnosis not present

## 2014-12-23 DIAGNOSIS — N390 Urinary tract infection, site not specified: Secondary | ICD-10-CM | POA: Diagnosis not present

## 2014-12-23 DIAGNOSIS — Z9181 History of falling: Secondary | ICD-10-CM | POA: Diagnosis not present

## 2015-01-06 DIAGNOSIS — Z1389 Encounter for screening for other disorder: Secondary | ICD-10-CM | POA: Diagnosis not present

## 2015-01-06 DIAGNOSIS — Z0000 Encounter for general adult medical examination without abnormal findings: Secondary | ICD-10-CM | POA: Diagnosis not present

## 2015-01-06 DIAGNOSIS — M810 Age-related osteoporosis without current pathological fracture: Secondary | ICD-10-CM | POA: Diagnosis not present

## 2015-05-16 DIAGNOSIS — Z23 Encounter for immunization: Secondary | ICD-10-CM | POA: Diagnosis not present

## 2015-09-05 DIAGNOSIS — H26491 Other secondary cataract, right eye: Secondary | ICD-10-CM | POA: Diagnosis not present

## 2015-11-22 DIAGNOSIS — F5101 Primary insomnia: Secondary | ICD-10-CM | POA: Diagnosis not present

## 2015-11-22 DIAGNOSIS — M341 CR(E)ST syndrome: Secondary | ICD-10-CM | POA: Diagnosis not present

## 2015-11-22 DIAGNOSIS — I10 Essential (primary) hypertension: Secondary | ICD-10-CM | POA: Diagnosis not present

## 2015-11-22 DIAGNOSIS — G609 Hereditary and idiopathic neuropathy, unspecified: Secondary | ICD-10-CM | POA: Diagnosis not present

## 2016-04-30 DIAGNOSIS — I10 Essential (primary) hypertension: Secondary | ICD-10-CM | POA: Diagnosis not present

## 2016-04-30 DIAGNOSIS — G5693 Unspecified mononeuropathy of bilateral upper limbs: Secondary | ICD-10-CM | POA: Diagnosis not present

## 2016-04-30 DIAGNOSIS — Z1389 Encounter for screening for other disorder: Secondary | ICD-10-CM | POA: Diagnosis not present

## 2016-04-30 DIAGNOSIS — G479 Sleep disorder, unspecified: Secondary | ICD-10-CM | POA: Diagnosis not present

## 2016-04-30 DIAGNOSIS — Z23 Encounter for immunization: Secondary | ICD-10-CM | POA: Diagnosis not present

## 2016-04-30 DIAGNOSIS — Z0000 Encounter for general adult medical examination without abnormal findings: Secondary | ICD-10-CM | POA: Diagnosis not present

## 2016-04-30 DIAGNOSIS — N3942 Incontinence without sensory awareness: Secondary | ICD-10-CM | POA: Diagnosis not present

## 2016-04-30 DIAGNOSIS — R269 Unspecified abnormalities of gait and mobility: Secondary | ICD-10-CM | POA: Diagnosis not present

## 2016-04-30 DIAGNOSIS — Z803 Family history of malignant neoplasm of breast: Secondary | ICD-10-CM | POA: Diagnosis not present

## 2016-04-30 DIAGNOSIS — M341 CR(E)ST syndrome: Secondary | ICD-10-CM | POA: Diagnosis not present

## 2016-04-30 DIAGNOSIS — M810 Age-related osteoporosis without current pathological fracture: Secondary | ICD-10-CM | POA: Diagnosis not present

## 2016-05-06 DIAGNOSIS — Z0000 Encounter for general adult medical examination without abnormal findings: Secondary | ICD-10-CM | POA: Diagnosis not present

## 2016-05-06 DIAGNOSIS — N3942 Incontinence without sensory awareness: Secondary | ICD-10-CM | POA: Diagnosis not present

## 2016-06-26 DIAGNOSIS — M810 Age-related osteoporosis without current pathological fracture: Secondary | ICD-10-CM | POA: Diagnosis not present

## 2016-07-08 DIAGNOSIS — G609 Hereditary and idiopathic neuropathy, unspecified: Secondary | ICD-10-CM | POA: Diagnosis not present

## 2016-07-08 DIAGNOSIS — Z993 Dependence on wheelchair: Secondary | ICD-10-CM | POA: Diagnosis not present

## 2016-07-08 DIAGNOSIS — I10 Essential (primary) hypertension: Secondary | ICD-10-CM | POA: Diagnosis not present

## 2016-07-08 DIAGNOSIS — M341 CR(E)ST syndrome: Secondary | ICD-10-CM | POA: Diagnosis not present

## 2016-07-08 DIAGNOSIS — Z4800 Encounter for change or removal of nonsurgical wound dressing: Secondary | ICD-10-CM | POA: Diagnosis not present

## 2016-07-08 DIAGNOSIS — M810 Age-related osteoporosis without current pathological fracture: Secondary | ICD-10-CM | POA: Diagnosis not present

## 2016-07-08 DIAGNOSIS — Z87440 Personal history of urinary (tract) infections: Secondary | ICD-10-CM | POA: Diagnosis not present

## 2016-07-08 DIAGNOSIS — L89322 Pressure ulcer of left buttock, stage 2: Secondary | ICD-10-CM | POA: Diagnosis not present

## 2016-07-11 DIAGNOSIS — I10 Essential (primary) hypertension: Secondary | ICD-10-CM | POA: Diagnosis not present

## 2016-07-11 DIAGNOSIS — G609 Hereditary and idiopathic neuropathy, unspecified: Secondary | ICD-10-CM | POA: Diagnosis not present

## 2016-07-11 DIAGNOSIS — M341 CR(E)ST syndrome: Secondary | ICD-10-CM | POA: Diagnosis not present

## 2016-07-11 DIAGNOSIS — M810 Age-related osteoporosis without current pathological fracture: Secondary | ICD-10-CM | POA: Diagnosis not present

## 2016-07-11 DIAGNOSIS — Z993 Dependence on wheelchair: Secondary | ICD-10-CM | POA: Diagnosis not present

## 2016-07-11 DIAGNOSIS — L89322 Pressure ulcer of left buttock, stage 2: Secondary | ICD-10-CM | POA: Diagnosis not present

## 2016-07-15 DIAGNOSIS — I10 Essential (primary) hypertension: Secondary | ICD-10-CM | POA: Diagnosis not present

## 2016-07-15 DIAGNOSIS — L89322 Pressure ulcer of left buttock, stage 2: Secondary | ICD-10-CM | POA: Diagnosis not present

## 2016-07-15 DIAGNOSIS — Z993 Dependence on wheelchair: Secondary | ICD-10-CM | POA: Diagnosis not present

## 2016-07-15 DIAGNOSIS — G609 Hereditary and idiopathic neuropathy, unspecified: Secondary | ICD-10-CM | POA: Diagnosis not present

## 2016-07-15 DIAGNOSIS — M810 Age-related osteoporosis without current pathological fracture: Secondary | ICD-10-CM | POA: Diagnosis not present

## 2016-07-15 DIAGNOSIS — M341 CR(E)ST syndrome: Secondary | ICD-10-CM | POA: Diagnosis not present

## 2016-07-17 DIAGNOSIS — G5693 Unspecified mononeuropathy of bilateral upper limbs: Secondary | ICD-10-CM | POA: Diagnosis not present

## 2016-07-17 DIAGNOSIS — L89152 Pressure ulcer of sacral region, stage 2: Secondary | ICD-10-CM | POA: Diagnosis not present

## 2016-07-17 DIAGNOSIS — R269 Unspecified abnormalities of gait and mobility: Secondary | ICD-10-CM | POA: Diagnosis not present

## 2016-07-17 DIAGNOSIS — N3942 Incontinence without sensory awareness: Secondary | ICD-10-CM | POA: Diagnosis not present

## 2016-07-18 DIAGNOSIS — M341 CR(E)ST syndrome: Secondary | ICD-10-CM | POA: Diagnosis not present

## 2016-07-18 DIAGNOSIS — I10 Essential (primary) hypertension: Secondary | ICD-10-CM | POA: Diagnosis not present

## 2016-07-18 DIAGNOSIS — L89322 Pressure ulcer of left buttock, stage 2: Secondary | ICD-10-CM | POA: Diagnosis not present

## 2016-07-18 DIAGNOSIS — G609 Hereditary and idiopathic neuropathy, unspecified: Secondary | ICD-10-CM | POA: Diagnosis not present

## 2016-07-18 DIAGNOSIS — Z993 Dependence on wheelchair: Secondary | ICD-10-CM | POA: Diagnosis not present

## 2016-07-18 DIAGNOSIS — M810 Age-related osteoporosis without current pathological fracture: Secondary | ICD-10-CM | POA: Diagnosis not present

## 2016-07-23 DIAGNOSIS — L89322 Pressure ulcer of left buttock, stage 2: Secondary | ICD-10-CM | POA: Diagnosis not present

## 2016-07-23 DIAGNOSIS — M810 Age-related osteoporosis without current pathological fracture: Secondary | ICD-10-CM | POA: Diagnosis not present

## 2016-07-23 DIAGNOSIS — I10 Essential (primary) hypertension: Secondary | ICD-10-CM | POA: Diagnosis not present

## 2016-07-23 DIAGNOSIS — Z993 Dependence on wheelchair: Secondary | ICD-10-CM | POA: Diagnosis not present

## 2016-07-23 DIAGNOSIS — G609 Hereditary and idiopathic neuropathy, unspecified: Secondary | ICD-10-CM | POA: Diagnosis not present

## 2016-07-23 DIAGNOSIS — M341 CR(E)ST syndrome: Secondary | ICD-10-CM | POA: Diagnosis not present

## 2016-07-26 DIAGNOSIS — M810 Age-related osteoporosis without current pathological fracture: Secondary | ICD-10-CM | POA: Diagnosis not present

## 2016-07-26 DIAGNOSIS — G609 Hereditary and idiopathic neuropathy, unspecified: Secondary | ICD-10-CM | POA: Diagnosis not present

## 2016-07-26 DIAGNOSIS — M341 CR(E)ST syndrome: Secondary | ICD-10-CM | POA: Diagnosis not present

## 2016-07-26 DIAGNOSIS — Z993 Dependence on wheelchair: Secondary | ICD-10-CM | POA: Diagnosis not present

## 2016-07-26 DIAGNOSIS — L89322 Pressure ulcer of left buttock, stage 2: Secondary | ICD-10-CM | POA: Diagnosis not present

## 2016-07-26 DIAGNOSIS — I10 Essential (primary) hypertension: Secondary | ICD-10-CM | POA: Diagnosis not present

## 2016-07-30 DIAGNOSIS — L89322 Pressure ulcer of left buttock, stage 2: Secondary | ICD-10-CM | POA: Diagnosis not present

## 2016-07-30 DIAGNOSIS — G609 Hereditary and idiopathic neuropathy, unspecified: Secondary | ICD-10-CM | POA: Diagnosis not present

## 2016-07-30 DIAGNOSIS — M341 CR(E)ST syndrome: Secondary | ICD-10-CM | POA: Diagnosis not present

## 2016-07-30 DIAGNOSIS — Z993 Dependence on wheelchair: Secondary | ICD-10-CM | POA: Diagnosis not present

## 2016-07-30 DIAGNOSIS — I10 Essential (primary) hypertension: Secondary | ICD-10-CM | POA: Diagnosis not present

## 2016-07-30 DIAGNOSIS — M810 Age-related osteoporosis without current pathological fracture: Secondary | ICD-10-CM | POA: Diagnosis not present

## 2016-08-02 DIAGNOSIS — M810 Age-related osteoporosis without current pathological fracture: Secondary | ICD-10-CM | POA: Diagnosis not present

## 2016-08-02 DIAGNOSIS — G609 Hereditary and idiopathic neuropathy, unspecified: Secondary | ICD-10-CM | POA: Diagnosis not present

## 2016-08-02 DIAGNOSIS — Z993 Dependence on wheelchair: Secondary | ICD-10-CM | POA: Diagnosis not present

## 2016-08-02 DIAGNOSIS — I10 Essential (primary) hypertension: Secondary | ICD-10-CM | POA: Diagnosis not present

## 2016-08-02 DIAGNOSIS — M341 CR(E)ST syndrome: Secondary | ICD-10-CM | POA: Diagnosis not present

## 2016-08-02 DIAGNOSIS — L89322 Pressure ulcer of left buttock, stage 2: Secondary | ICD-10-CM | POA: Diagnosis not present

## 2016-08-05 DIAGNOSIS — M810 Age-related osteoporosis without current pathological fracture: Secondary | ICD-10-CM | POA: Diagnosis not present

## 2016-08-05 DIAGNOSIS — L89322 Pressure ulcer of left buttock, stage 2: Secondary | ICD-10-CM | POA: Diagnosis not present

## 2016-08-05 DIAGNOSIS — I10 Essential (primary) hypertension: Secondary | ICD-10-CM | POA: Diagnosis not present

## 2016-08-05 DIAGNOSIS — M341 CR(E)ST syndrome: Secondary | ICD-10-CM | POA: Diagnosis not present

## 2016-08-05 DIAGNOSIS — Z993 Dependence on wheelchair: Secondary | ICD-10-CM | POA: Diagnosis not present

## 2016-08-05 DIAGNOSIS — G609 Hereditary and idiopathic neuropathy, unspecified: Secondary | ICD-10-CM | POA: Diagnosis not present

## 2016-08-09 DIAGNOSIS — Z993 Dependence on wheelchair: Secondary | ICD-10-CM | POA: Diagnosis not present

## 2016-08-09 DIAGNOSIS — M810 Age-related osteoporosis without current pathological fracture: Secondary | ICD-10-CM | POA: Diagnosis not present

## 2016-08-09 DIAGNOSIS — M341 CR(E)ST syndrome: Secondary | ICD-10-CM | POA: Diagnosis not present

## 2016-08-09 DIAGNOSIS — L89322 Pressure ulcer of left buttock, stage 2: Secondary | ICD-10-CM | POA: Diagnosis not present

## 2016-08-09 DIAGNOSIS — G609 Hereditary and idiopathic neuropathy, unspecified: Secondary | ICD-10-CM | POA: Diagnosis not present

## 2016-08-09 DIAGNOSIS — I10 Essential (primary) hypertension: Secondary | ICD-10-CM | POA: Diagnosis not present

## 2016-08-13 DIAGNOSIS — M341 CR(E)ST syndrome: Secondary | ICD-10-CM | POA: Diagnosis not present

## 2016-08-13 DIAGNOSIS — I10 Essential (primary) hypertension: Secondary | ICD-10-CM | POA: Diagnosis not present

## 2016-08-13 DIAGNOSIS — Z993 Dependence on wheelchair: Secondary | ICD-10-CM | POA: Diagnosis not present

## 2016-08-13 DIAGNOSIS — M810 Age-related osteoporosis without current pathological fracture: Secondary | ICD-10-CM | POA: Diagnosis not present

## 2016-08-13 DIAGNOSIS — G609 Hereditary and idiopathic neuropathy, unspecified: Secondary | ICD-10-CM | POA: Diagnosis not present

## 2016-08-13 DIAGNOSIS — L89322 Pressure ulcer of left buttock, stage 2: Secondary | ICD-10-CM | POA: Diagnosis not present

## 2016-08-15 DIAGNOSIS — M810 Age-related osteoporosis without current pathological fracture: Secondary | ICD-10-CM | POA: Diagnosis not present

## 2016-08-15 DIAGNOSIS — G609 Hereditary and idiopathic neuropathy, unspecified: Secondary | ICD-10-CM | POA: Diagnosis not present

## 2016-08-15 DIAGNOSIS — Z993 Dependence on wheelchair: Secondary | ICD-10-CM | POA: Diagnosis not present

## 2016-08-15 DIAGNOSIS — I10 Essential (primary) hypertension: Secondary | ICD-10-CM | POA: Diagnosis not present

## 2016-08-15 DIAGNOSIS — M341 CR(E)ST syndrome: Secondary | ICD-10-CM | POA: Diagnosis not present

## 2016-08-15 DIAGNOSIS — L89322 Pressure ulcer of left buttock, stage 2: Secondary | ICD-10-CM | POA: Diagnosis not present

## 2016-08-19 DIAGNOSIS — M341 CR(E)ST syndrome: Secondary | ICD-10-CM | POA: Diagnosis not present

## 2016-08-19 DIAGNOSIS — L89322 Pressure ulcer of left buttock, stage 2: Secondary | ICD-10-CM | POA: Diagnosis not present

## 2016-08-19 DIAGNOSIS — I10 Essential (primary) hypertension: Secondary | ICD-10-CM | POA: Diagnosis not present

## 2016-08-19 DIAGNOSIS — G609 Hereditary and idiopathic neuropathy, unspecified: Secondary | ICD-10-CM | POA: Diagnosis not present

## 2016-08-19 DIAGNOSIS — Z993 Dependence on wheelchair: Secondary | ICD-10-CM | POA: Diagnosis not present

## 2016-08-19 DIAGNOSIS — M810 Age-related osteoporosis without current pathological fracture: Secondary | ICD-10-CM | POA: Diagnosis not present

## 2016-08-21 DIAGNOSIS — S31809A Unspecified open wound of unspecified buttock, initial encounter: Secondary | ICD-10-CM | POA: Diagnosis not present

## 2016-08-21 DIAGNOSIS — G5693 Unspecified mononeuropathy of bilateral upper limbs: Secondary | ICD-10-CM | POA: Diagnosis not present

## 2016-08-21 DIAGNOSIS — I10 Essential (primary) hypertension: Secondary | ICD-10-CM | POA: Diagnosis not present

## 2016-08-23 DIAGNOSIS — M341 CR(E)ST syndrome: Secondary | ICD-10-CM | POA: Diagnosis not present

## 2016-08-23 DIAGNOSIS — Z993 Dependence on wheelchair: Secondary | ICD-10-CM | POA: Diagnosis not present

## 2016-08-23 DIAGNOSIS — G609 Hereditary and idiopathic neuropathy, unspecified: Secondary | ICD-10-CM | POA: Diagnosis not present

## 2016-08-23 DIAGNOSIS — L89322 Pressure ulcer of left buttock, stage 2: Secondary | ICD-10-CM | POA: Diagnosis not present

## 2016-08-23 DIAGNOSIS — M810 Age-related osteoporosis without current pathological fracture: Secondary | ICD-10-CM | POA: Diagnosis not present

## 2016-08-23 DIAGNOSIS — I10 Essential (primary) hypertension: Secondary | ICD-10-CM | POA: Diagnosis not present

## 2016-08-28 DIAGNOSIS — G609 Hereditary and idiopathic neuropathy, unspecified: Secondary | ICD-10-CM | POA: Diagnosis not present

## 2016-08-28 DIAGNOSIS — L89322 Pressure ulcer of left buttock, stage 2: Secondary | ICD-10-CM | POA: Diagnosis not present

## 2016-08-28 DIAGNOSIS — M341 CR(E)ST syndrome: Secondary | ICD-10-CM | POA: Diagnosis not present

## 2016-08-28 DIAGNOSIS — Z993 Dependence on wheelchair: Secondary | ICD-10-CM | POA: Diagnosis not present

## 2016-08-28 DIAGNOSIS — M810 Age-related osteoporosis without current pathological fracture: Secondary | ICD-10-CM | POA: Diagnosis not present

## 2016-08-28 DIAGNOSIS — I10 Essential (primary) hypertension: Secondary | ICD-10-CM | POA: Diagnosis not present

## 2016-08-30 DIAGNOSIS — G609 Hereditary and idiopathic neuropathy, unspecified: Secondary | ICD-10-CM | POA: Diagnosis not present

## 2016-08-30 DIAGNOSIS — Z993 Dependence on wheelchair: Secondary | ICD-10-CM | POA: Diagnosis not present

## 2016-08-30 DIAGNOSIS — M810 Age-related osteoporosis without current pathological fracture: Secondary | ICD-10-CM | POA: Diagnosis not present

## 2016-08-30 DIAGNOSIS — L89322 Pressure ulcer of left buttock, stage 2: Secondary | ICD-10-CM | POA: Diagnosis not present

## 2016-08-30 DIAGNOSIS — I10 Essential (primary) hypertension: Secondary | ICD-10-CM | POA: Diagnosis not present

## 2016-08-30 DIAGNOSIS — M341 CR(E)ST syndrome: Secondary | ICD-10-CM | POA: Diagnosis not present

## 2016-09-03 DIAGNOSIS — Z993 Dependence on wheelchair: Secondary | ICD-10-CM | POA: Diagnosis not present

## 2016-09-03 DIAGNOSIS — M341 CR(E)ST syndrome: Secondary | ICD-10-CM | POA: Diagnosis not present

## 2016-09-03 DIAGNOSIS — M810 Age-related osteoporosis without current pathological fracture: Secondary | ICD-10-CM | POA: Diagnosis not present

## 2016-09-03 DIAGNOSIS — L89322 Pressure ulcer of left buttock, stage 2: Secondary | ICD-10-CM | POA: Diagnosis not present

## 2016-09-03 DIAGNOSIS — G609 Hereditary and idiopathic neuropathy, unspecified: Secondary | ICD-10-CM | POA: Diagnosis not present

## 2016-09-03 DIAGNOSIS — I10 Essential (primary) hypertension: Secondary | ICD-10-CM | POA: Diagnosis not present

## 2016-09-05 DIAGNOSIS — G609 Hereditary and idiopathic neuropathy, unspecified: Secondary | ICD-10-CM | POA: Diagnosis not present

## 2016-09-05 DIAGNOSIS — I10 Essential (primary) hypertension: Secondary | ICD-10-CM | POA: Diagnosis not present

## 2016-09-05 DIAGNOSIS — Z993 Dependence on wheelchair: Secondary | ICD-10-CM | POA: Diagnosis not present

## 2016-09-05 DIAGNOSIS — M341 CR(E)ST syndrome: Secondary | ICD-10-CM | POA: Diagnosis not present

## 2016-09-05 DIAGNOSIS — M810 Age-related osteoporosis without current pathological fracture: Secondary | ICD-10-CM | POA: Diagnosis not present

## 2016-09-05 DIAGNOSIS — L89322 Pressure ulcer of left buttock, stage 2: Secondary | ICD-10-CM | POA: Diagnosis not present

## 2016-09-06 DIAGNOSIS — G609 Hereditary and idiopathic neuropathy, unspecified: Secondary | ICD-10-CM | POA: Diagnosis not present

## 2016-09-06 DIAGNOSIS — M6281 Muscle weakness (generalized): Secondary | ICD-10-CM | POA: Diagnosis not present

## 2016-09-06 DIAGNOSIS — I10 Essential (primary) hypertension: Secondary | ICD-10-CM | POA: Diagnosis not present

## 2016-09-06 DIAGNOSIS — R2689 Other abnormalities of gait and mobility: Secondary | ICD-10-CM | POA: Diagnosis not present

## 2016-09-06 DIAGNOSIS — M341 CR(E)ST syndrome: Secondary | ICD-10-CM | POA: Diagnosis not present

## 2016-09-06 DIAGNOSIS — Z993 Dependence on wheelchair: Secondary | ICD-10-CM | POA: Diagnosis not present

## 2016-09-06 DIAGNOSIS — M810 Age-related osteoporosis without current pathological fracture: Secondary | ICD-10-CM | POA: Diagnosis not present

## 2016-09-09 DIAGNOSIS — M341 CR(E)ST syndrome: Secondary | ICD-10-CM | POA: Diagnosis not present

## 2016-09-09 DIAGNOSIS — R2689 Other abnormalities of gait and mobility: Secondary | ICD-10-CM | POA: Diagnosis not present

## 2016-09-09 DIAGNOSIS — M810 Age-related osteoporosis without current pathological fracture: Secondary | ICD-10-CM | POA: Diagnosis not present

## 2016-09-09 DIAGNOSIS — G609 Hereditary and idiopathic neuropathy, unspecified: Secondary | ICD-10-CM | POA: Diagnosis not present

## 2016-09-09 DIAGNOSIS — M6281 Muscle weakness (generalized): Secondary | ICD-10-CM | POA: Diagnosis not present

## 2016-09-09 DIAGNOSIS — I10 Essential (primary) hypertension: Secondary | ICD-10-CM | POA: Diagnosis not present

## 2016-09-13 DIAGNOSIS — M810 Age-related osteoporosis without current pathological fracture: Secondary | ICD-10-CM | POA: Diagnosis not present

## 2016-09-13 DIAGNOSIS — M6281 Muscle weakness (generalized): Secondary | ICD-10-CM | POA: Diagnosis not present

## 2016-09-13 DIAGNOSIS — M341 CR(E)ST syndrome: Secondary | ICD-10-CM | POA: Diagnosis not present

## 2016-09-13 DIAGNOSIS — I10 Essential (primary) hypertension: Secondary | ICD-10-CM | POA: Diagnosis not present

## 2016-09-13 DIAGNOSIS — G609 Hereditary and idiopathic neuropathy, unspecified: Secondary | ICD-10-CM | POA: Diagnosis not present

## 2016-09-13 DIAGNOSIS — R2689 Other abnormalities of gait and mobility: Secondary | ICD-10-CM | POA: Diagnosis not present

## 2016-09-18 DIAGNOSIS — I10 Essential (primary) hypertension: Secondary | ICD-10-CM | POA: Diagnosis not present

## 2016-09-18 DIAGNOSIS — M6281 Muscle weakness (generalized): Secondary | ICD-10-CM | POA: Diagnosis not present

## 2016-09-18 DIAGNOSIS — M810 Age-related osteoporosis without current pathological fracture: Secondary | ICD-10-CM | POA: Diagnosis not present

## 2016-09-18 DIAGNOSIS — M341 CR(E)ST syndrome: Secondary | ICD-10-CM | POA: Diagnosis not present

## 2016-09-18 DIAGNOSIS — R2689 Other abnormalities of gait and mobility: Secondary | ICD-10-CM | POA: Diagnosis not present

## 2016-09-18 DIAGNOSIS — G609 Hereditary and idiopathic neuropathy, unspecified: Secondary | ICD-10-CM | POA: Diagnosis not present

## 2016-09-19 DIAGNOSIS — M6281 Muscle weakness (generalized): Secondary | ICD-10-CM | POA: Diagnosis not present

## 2016-09-19 DIAGNOSIS — R2689 Other abnormalities of gait and mobility: Secondary | ICD-10-CM | POA: Diagnosis not present

## 2016-09-19 DIAGNOSIS — G609 Hereditary and idiopathic neuropathy, unspecified: Secondary | ICD-10-CM | POA: Diagnosis not present

## 2016-09-19 DIAGNOSIS — I10 Essential (primary) hypertension: Secondary | ICD-10-CM | POA: Diagnosis not present

## 2016-09-19 DIAGNOSIS — M341 CR(E)ST syndrome: Secondary | ICD-10-CM | POA: Diagnosis not present

## 2016-09-19 DIAGNOSIS — M810 Age-related osteoporosis without current pathological fracture: Secondary | ICD-10-CM | POA: Diagnosis not present

## 2016-09-20 DIAGNOSIS — M6281 Muscle weakness (generalized): Secondary | ICD-10-CM | POA: Diagnosis not present

## 2016-09-20 DIAGNOSIS — I10 Essential (primary) hypertension: Secondary | ICD-10-CM | POA: Diagnosis not present

## 2016-09-20 DIAGNOSIS — G609 Hereditary and idiopathic neuropathy, unspecified: Secondary | ICD-10-CM | POA: Diagnosis not present

## 2016-09-20 DIAGNOSIS — M341 CR(E)ST syndrome: Secondary | ICD-10-CM | POA: Diagnosis not present

## 2016-09-20 DIAGNOSIS — M810 Age-related osteoporosis without current pathological fracture: Secondary | ICD-10-CM | POA: Diagnosis not present

## 2016-09-20 DIAGNOSIS — R2689 Other abnormalities of gait and mobility: Secondary | ICD-10-CM | POA: Diagnosis not present

## 2016-09-23 DIAGNOSIS — I10 Essential (primary) hypertension: Secondary | ICD-10-CM | POA: Diagnosis not present

## 2016-09-23 DIAGNOSIS — M341 CR(E)ST syndrome: Secondary | ICD-10-CM | POA: Diagnosis not present

## 2016-09-23 DIAGNOSIS — M6281 Muscle weakness (generalized): Secondary | ICD-10-CM | POA: Diagnosis not present

## 2016-09-23 DIAGNOSIS — R2689 Other abnormalities of gait and mobility: Secondary | ICD-10-CM | POA: Diagnosis not present

## 2016-09-23 DIAGNOSIS — G609 Hereditary and idiopathic neuropathy, unspecified: Secondary | ICD-10-CM | POA: Diagnosis not present

## 2016-09-23 DIAGNOSIS — M810 Age-related osteoporosis without current pathological fracture: Secondary | ICD-10-CM | POA: Diagnosis not present

## 2016-09-24 DIAGNOSIS — M6281 Muscle weakness (generalized): Secondary | ICD-10-CM | POA: Diagnosis not present

## 2016-09-24 DIAGNOSIS — I10 Essential (primary) hypertension: Secondary | ICD-10-CM | POA: Diagnosis not present

## 2016-09-24 DIAGNOSIS — M341 CR(E)ST syndrome: Secondary | ICD-10-CM | POA: Diagnosis not present

## 2016-09-24 DIAGNOSIS — R2689 Other abnormalities of gait and mobility: Secondary | ICD-10-CM | POA: Diagnosis not present

## 2016-09-24 DIAGNOSIS — M810 Age-related osteoporosis without current pathological fracture: Secondary | ICD-10-CM | POA: Diagnosis not present

## 2016-09-24 DIAGNOSIS — G609 Hereditary and idiopathic neuropathy, unspecified: Secondary | ICD-10-CM | POA: Diagnosis not present

## 2016-09-26 DIAGNOSIS — R2689 Other abnormalities of gait and mobility: Secondary | ICD-10-CM | POA: Diagnosis not present

## 2016-09-26 DIAGNOSIS — G609 Hereditary and idiopathic neuropathy, unspecified: Secondary | ICD-10-CM | POA: Diagnosis not present

## 2016-09-26 DIAGNOSIS — I10 Essential (primary) hypertension: Secondary | ICD-10-CM | POA: Diagnosis not present

## 2016-09-26 DIAGNOSIS — M810 Age-related osteoporosis without current pathological fracture: Secondary | ICD-10-CM | POA: Diagnosis not present

## 2016-09-26 DIAGNOSIS — M341 CR(E)ST syndrome: Secondary | ICD-10-CM | POA: Diagnosis not present

## 2016-09-26 DIAGNOSIS — M6281 Muscle weakness (generalized): Secondary | ICD-10-CM | POA: Diagnosis not present

## 2016-10-02 DIAGNOSIS — M810 Age-related osteoporosis without current pathological fracture: Secondary | ICD-10-CM | POA: Diagnosis not present

## 2016-10-02 DIAGNOSIS — G609 Hereditary and idiopathic neuropathy, unspecified: Secondary | ICD-10-CM | POA: Diagnosis not present

## 2016-10-02 DIAGNOSIS — I10 Essential (primary) hypertension: Secondary | ICD-10-CM | POA: Diagnosis not present

## 2016-10-02 DIAGNOSIS — M6281 Muscle weakness (generalized): Secondary | ICD-10-CM | POA: Diagnosis not present

## 2016-10-02 DIAGNOSIS — M341 CR(E)ST syndrome: Secondary | ICD-10-CM | POA: Diagnosis not present

## 2016-10-02 DIAGNOSIS — R2689 Other abnormalities of gait and mobility: Secondary | ICD-10-CM | POA: Diagnosis not present

## 2016-10-04 DIAGNOSIS — G609 Hereditary and idiopathic neuropathy, unspecified: Secondary | ICD-10-CM | POA: Diagnosis not present

## 2016-10-04 DIAGNOSIS — M341 CR(E)ST syndrome: Secondary | ICD-10-CM | POA: Diagnosis not present

## 2016-10-04 DIAGNOSIS — I10 Essential (primary) hypertension: Secondary | ICD-10-CM | POA: Diagnosis not present

## 2016-10-04 DIAGNOSIS — R2689 Other abnormalities of gait and mobility: Secondary | ICD-10-CM | POA: Diagnosis not present

## 2016-10-04 DIAGNOSIS — M810 Age-related osteoporosis without current pathological fracture: Secondary | ICD-10-CM | POA: Diagnosis not present

## 2016-10-04 DIAGNOSIS — M6281 Muscle weakness (generalized): Secondary | ICD-10-CM | POA: Diagnosis not present

## 2016-10-08 DIAGNOSIS — I10 Essential (primary) hypertension: Secondary | ICD-10-CM | POA: Diagnosis not present

## 2016-10-08 DIAGNOSIS — M6281 Muscle weakness (generalized): Secondary | ICD-10-CM | POA: Diagnosis not present

## 2016-10-08 DIAGNOSIS — M810 Age-related osteoporosis without current pathological fracture: Secondary | ICD-10-CM | POA: Diagnosis not present

## 2016-10-08 DIAGNOSIS — M341 CR(E)ST syndrome: Secondary | ICD-10-CM | POA: Diagnosis not present

## 2016-10-08 DIAGNOSIS — R2689 Other abnormalities of gait and mobility: Secondary | ICD-10-CM | POA: Diagnosis not present

## 2016-10-08 DIAGNOSIS — G609 Hereditary and idiopathic neuropathy, unspecified: Secondary | ICD-10-CM | POA: Diagnosis not present

## 2016-10-11 DIAGNOSIS — M6281 Muscle weakness (generalized): Secondary | ICD-10-CM | POA: Diagnosis not present

## 2016-10-11 DIAGNOSIS — M341 CR(E)ST syndrome: Secondary | ICD-10-CM | POA: Diagnosis not present

## 2016-10-11 DIAGNOSIS — R2689 Other abnormalities of gait and mobility: Secondary | ICD-10-CM | POA: Diagnosis not present

## 2016-10-11 DIAGNOSIS — M810 Age-related osteoporosis without current pathological fracture: Secondary | ICD-10-CM | POA: Diagnosis not present

## 2016-10-11 DIAGNOSIS — G609 Hereditary and idiopathic neuropathy, unspecified: Secondary | ICD-10-CM | POA: Diagnosis not present

## 2016-10-11 DIAGNOSIS — I10 Essential (primary) hypertension: Secondary | ICD-10-CM | POA: Diagnosis not present

## 2016-10-29 DIAGNOSIS — R05 Cough: Secondary | ICD-10-CM | POA: Diagnosis not present

## 2016-10-29 DIAGNOSIS — I10 Essential (primary) hypertension: Secondary | ICD-10-CM | POA: Diagnosis not present

## 2016-10-29 DIAGNOSIS — G5693 Unspecified mononeuropathy of bilateral upper limbs: Secondary | ICD-10-CM | POA: Diagnosis not present

## 2016-10-29 DIAGNOSIS — Z1389 Encounter for screening for other disorder: Secondary | ICD-10-CM | POA: Diagnosis not present

## 2016-10-29 DIAGNOSIS — F5101 Primary insomnia: Secondary | ICD-10-CM | POA: Diagnosis not present

## 2016-10-29 DIAGNOSIS — Z803 Family history of malignant neoplasm of breast: Secondary | ICD-10-CM | POA: Diagnosis not present

## 2016-10-29 DIAGNOSIS — R636 Underweight: Secondary | ICD-10-CM | POA: Diagnosis not present

## 2016-10-29 DIAGNOSIS — Z0000 Encounter for general adult medical examination without abnormal findings: Secondary | ICD-10-CM | POA: Diagnosis not present

## 2016-10-29 DIAGNOSIS — R208 Other disturbances of skin sensation: Secondary | ICD-10-CM | POA: Diagnosis not present

## 2016-10-29 DIAGNOSIS — M810 Age-related osteoporosis without current pathological fracture: Secondary | ICD-10-CM | POA: Diagnosis not present

## 2016-10-29 DIAGNOSIS — M341 CR(E)ST syndrome: Secondary | ICD-10-CM | POA: Diagnosis not present

## 2016-10-29 DIAGNOSIS — G609 Hereditary and idiopathic neuropathy, unspecified: Secondary | ICD-10-CM | POA: Diagnosis not present

## 2017-03-19 ENCOUNTER — Inpatient Hospital Stay (HOSPITAL_COMMUNITY)
Admission: EM | Admit: 2017-03-19 | Discharge: 2017-03-24 | DRG: 853 | Disposition: A | Discharge status: Home Health | Payer: Medicare Other | Attending: Internal Medicine | Admitting: Internal Medicine

## 2017-03-19 ENCOUNTER — Emergency Department (HOSPITAL_COMMUNITY): Payer: Medicare Other

## 2017-03-19 ENCOUNTER — Encounter (HOSPITAL_COMMUNITY): Payer: Self-pay | Admitting: Emergency Medicine

## 2017-03-19 DIAGNOSIS — M810 Age-related osteoporosis without current pathological fracture: Secondary | ICD-10-CM | POA: Diagnosis present

## 2017-03-19 DIAGNOSIS — L89329 Pressure ulcer of left buttock, unspecified stage: Secondary | ICD-10-CM | POA: Diagnosis not present

## 2017-03-19 DIAGNOSIS — R9431 Abnormal electrocardiogram [ECG] [EKG]: Secondary | ICD-10-CM | POA: Diagnosis not present

## 2017-03-19 DIAGNOSIS — A419 Sepsis, unspecified organism: Secondary | ICD-10-CM | POA: Diagnosis not present

## 2017-03-19 DIAGNOSIS — Z7401 Bed confinement status: Secondary | ICD-10-CM | POA: Diagnosis not present

## 2017-03-19 DIAGNOSIS — I10 Essential (primary) hypertension: Secondary | ICD-10-CM | POA: Diagnosis present

## 2017-03-19 DIAGNOSIS — L89323 Pressure ulcer of left buttock, stage 3: Secondary | ICD-10-CM | POA: Diagnosis present

## 2017-03-19 DIAGNOSIS — G629 Polyneuropathy, unspecified: Secondary | ICD-10-CM | POA: Diagnosis present

## 2017-03-19 DIAGNOSIS — M341 CR(E)ST syndrome: Secondary | ICD-10-CM | POA: Diagnosis present

## 2017-03-19 DIAGNOSIS — Z9181 History of falling: Secondary | ICD-10-CM | POA: Diagnosis not present

## 2017-03-19 DIAGNOSIS — K5641 Fecal impaction: Secondary | ICD-10-CM

## 2017-03-19 DIAGNOSIS — E43 Unspecified severe protein-calorie malnutrition: Secondary | ICD-10-CM | POA: Diagnosis present

## 2017-03-19 DIAGNOSIS — Z79899 Other long term (current) drug therapy: Secondary | ICD-10-CM

## 2017-03-19 DIAGNOSIS — R509 Fever, unspecified: Secondary | ICD-10-CM | POA: Diagnosis not present

## 2017-03-19 DIAGNOSIS — Z66 Do not resuscitate: Secondary | ICD-10-CM | POA: Diagnosis not present

## 2017-03-19 DIAGNOSIS — L89100 Pressure ulcer of unspecified part of back, unstageable: Secondary | ICD-10-CM

## 2017-03-19 DIAGNOSIS — L89109 Pressure ulcer of unspecified part of back, unspecified stage: Secondary | ICD-10-CM | POA: Diagnosis present

## 2017-03-19 DIAGNOSIS — Z8739 Personal history of other diseases of the musculoskeletal system and connective tissue: Secondary | ICD-10-CM

## 2017-03-19 DIAGNOSIS — Z87891 Personal history of nicotine dependence: Secondary | ICD-10-CM

## 2017-03-19 DIAGNOSIS — L03317 Cellulitis of buttock: Secondary | ICD-10-CM

## 2017-03-19 DIAGNOSIS — Z993 Dependence on wheelchair: Secondary | ICD-10-CM

## 2017-03-19 DIAGNOSIS — N390 Urinary tract infection, site not specified: Secondary | ICD-10-CM | POA: Diagnosis present

## 2017-03-19 DIAGNOSIS — R5381 Other malaise: Secondary | ICD-10-CM | POA: Diagnosis not present

## 2017-03-19 DIAGNOSIS — L89140 Pressure ulcer of left lower back, unstageable: Secondary | ICD-10-CM | POA: Diagnosis not present

## 2017-03-19 DIAGNOSIS — Z681 Body mass index (BMI) 19 or less, adult: Secondary | ICD-10-CM | POA: Diagnosis not present

## 2017-03-19 DIAGNOSIS — N200 Calculus of kidney: Secondary | ICD-10-CM | POA: Diagnosis not present

## 2017-03-19 DIAGNOSIS — R05 Cough: Secondary | ICD-10-CM | POA: Diagnosis not present

## 2017-03-19 LAB — CBC
HEMATOCRIT: 34 % — AB (ref 36.0–46.0)
HEMOGLOBIN: 10.9 g/dL — AB (ref 12.0–15.0)
MCH: 26.8 pg (ref 26.0–34.0)
MCHC: 32.1 g/dL (ref 30.0–36.0)
MCV: 83.7 fL (ref 78.0–100.0)
Platelets: 306 10*3/uL (ref 150–400)
RBC: 4.06 MIL/uL (ref 3.87–5.11)
RDW: 14.5 % (ref 11.5–15.5)
WBC: 11.6 10*3/uL — ABNORMAL HIGH (ref 4.0–10.5)

## 2017-03-19 LAB — CBC WITH DIFFERENTIAL/PLATELET
Basophils Absolute: 0 10*3/uL (ref 0.0–0.1)
Basophils Relative: 0 %
EOS PCT: 0 %
Eosinophils Absolute: 0 10*3/uL (ref 0.0–0.7)
HCT: 33.9 % — ABNORMAL LOW (ref 36.0–46.0)
Hemoglobin: 10.8 g/dL — ABNORMAL LOW (ref 12.0–15.0)
LYMPHS ABS: 1.4 10*3/uL (ref 0.7–4.0)
Lymphocytes Relative: 9 %
MCH: 26.9 pg (ref 26.0–34.0)
MCHC: 31.9 g/dL (ref 30.0–36.0)
MCV: 84.5 fL (ref 78.0–100.0)
MONO ABS: 1.2 10*3/uL — AB (ref 0.1–1.0)
MONOS PCT: 8 %
NEUTROS PCT: 83 %
Neutro Abs: 13.2 10*3/uL — ABNORMAL HIGH (ref 1.7–7.7)
PLATELETS: 355 10*3/uL (ref 150–400)
RBC: 4.01 MIL/uL (ref 3.87–5.11)
RDW: 14.5 % (ref 11.5–15.5)
WBC: 15.8 10*3/uL — ABNORMAL HIGH (ref 4.0–10.5)

## 2017-03-19 LAB — URINALYSIS, ROUTINE W REFLEX MICROSCOPIC
BILIRUBIN URINE: NEGATIVE
Glucose, UA: NEGATIVE mg/dL
KETONES UR: NEGATIVE mg/dL
Nitrite: NEGATIVE
Protein, ur: NEGATIVE mg/dL
SPECIFIC GRAVITY, URINE: 1.013 (ref 1.005–1.030)
pH: 6 (ref 5.0–8.0)

## 2017-03-19 LAB — COMPREHENSIVE METABOLIC PANEL
ALBUMIN: 3 g/dL — AB (ref 3.5–5.0)
ALT: 10 U/L — ABNORMAL LOW (ref 14–54)
AST: 22 U/L (ref 15–41)
Alkaline Phosphatase: 48 U/L (ref 38–126)
Anion gap: 7 (ref 5–15)
BUN: 24 mg/dL — AB (ref 6–20)
CHLORIDE: 102 mmol/L (ref 101–111)
CO2: 30 mmol/L (ref 22–32)
CREATININE: 0.82 mg/dL (ref 0.44–1.00)
Calcium: 8.3 mg/dL — ABNORMAL LOW (ref 8.9–10.3)
GFR calc non Af Amer: >60 mL/min (ref >60)
GLUCOSE: 92 mg/dL (ref 65–99)
Potassium: 4.2 mmol/L (ref 3.5–5.1)
SODIUM: 139 mmol/L (ref 135–145)
Total Bilirubin: 1.2 mg/dL (ref 0.3–1.2)
Total Protein: 6.3 g/dL — ABNORMAL LOW (ref 6.5–8.1)

## 2017-03-19 LAB — CREATININE, SERUM
Creatinine, Ser: 0.69 mg/dL (ref 0.44–1.00)
GFR calc Af Amer: >60 mL/min (ref >60)

## 2017-03-19 LAB — I-STAT CG4 LACTIC ACID, ED: LACTIC ACID, VENOUS: 1.18 mmol/L (ref 0.5–1.9)

## 2017-03-19 MED ORDER — FENTANYL CITRATE (PF) 100 MCG/2ML IJ SOLN
50.0000 ug | Freq: Once | INTRAMUSCULAR | Status: AC
  Administered 2017-03-19: 50 ug via INTRAVENOUS
  Filled 2017-03-19: qty 2

## 2017-03-19 MED ORDER — IOPAMIDOL (ISOVUE-300) INJECTION 61%
INTRAVENOUS | Status: AC
  Administered 2017-03-19: 80 mL via INTRAVENOUS
  Filled 2017-03-19: qty 100

## 2017-03-19 MED ORDER — PIPERACILLIN-TAZOBACTAM 3.375 G IVPB 30 MIN: 3.3750 g | Freq: Four times a day (QID) | INTRAVENOUS | Status: DC

## 2017-03-19 MED ORDER — SODIUM CHLORIDE 0.9 % IV BOLUS (SEPSIS)
250.0000 mL | Freq: Once | INTRAVENOUS | Status: AC
  Administered 2017-03-19: 250 mL via INTRAVENOUS

## 2017-03-19 MED ORDER — ACETAMINOPHEN 650 MG RE SUPP
650.0000 mg | Freq: Four times a day (QID) | RECTAL | Status: DC | PRN
Start: 2017-03-19 — End: 2017-03-24

## 2017-03-19 MED ORDER — POLYVINYL ALCOHOL 1.4 % OP SOLN
1.0000 [drp] | OPHTHALMIC | Status: DC | PRN
  Filled 2017-03-19: qty 15

## 2017-03-19 MED ORDER — PSYLLIUM 95 % PO PACK
1.0000 | PACK | Freq: Every day | ORAL | Status: DC
  Administered 2017-03-19 – 2017-03-23 (×4): 1 via ORAL
  Filled 2017-03-19 (×5): qty 1

## 2017-03-19 MED ORDER — IOPAMIDOL (ISOVUE-300) INJECTION 61%
100.0000 mL | Freq: Once | INTRAVENOUS | Status: AC | PRN
  Administered 2017-03-19: 80 mL via INTRAVENOUS

## 2017-03-19 MED ORDER — ONDANSETRON HCL 4 MG/2ML IJ SOLN: 4.0000 mg | Freq: Four times a day (QID) | INTRAMUSCULAR | Status: DC | PRN

## 2017-03-19 MED ORDER — FLUTICASONE PROPIONATE 50 MCG/ACT NA SUSP
2.0000 | Freq: Every day | NASAL | Status: DC
  Administered 2017-03-19 – 2017-03-24 (×6): 2 via NASAL
  Filled 2017-03-19: qty 16

## 2017-03-19 MED ORDER — SODIUM CHLORIDE 0.45 % IV SOLN
INTRAVENOUS | Status: AC
  Administered 2017-03-19 – 2017-03-20 (×2): via INTRAVENOUS

## 2017-03-19 MED ORDER — SODIUM CHLORIDE 0.9 % IV BOLUS (SEPSIS)
1000.0000 mL | Freq: Once | INTRAVENOUS | Status: AC
  Administered 2017-03-19: 1000 mL via INTRAVENOUS

## 2017-03-19 MED ORDER — VANCOMYCIN HCL IN DEXTROSE 750-5 MG/150ML-% IV SOLN
750.0000 mg | INTRAVENOUS | Status: DC
  Administered 2017-03-20 – 2017-03-23 (×4): 750 mg via INTRAVENOUS
  Filled 2017-03-19 (×4): qty 150

## 2017-03-19 MED ORDER — VANCOMYCIN HCL IN DEXTROSE 1-5 GM/200ML-% IV SOLN
1000.0000 mg | Freq: Once | INTRAVENOUS | Status: AC
  Administered 2017-03-19: 1000 mg via INTRAVENOUS
  Filled 2017-03-19: qty 200

## 2017-03-19 MED ORDER — PROPYLENE GLYCOL 0.6 % OP SOLN: 1.0000 [drp] | Freq: Four times a day (QID) | OPHTHALMIC | Status: DC

## 2017-03-19 MED ORDER — ONDANSETRON HCL 4 MG PO TABS: 4.0000 mg | ORAL_TABLET | Freq: Four times a day (QID) | ORAL | Status: DC | PRN

## 2017-03-19 MED ORDER — ENOXAPARIN SODIUM 30 MG/0.3ML ~~LOC~~ SOLN
30.0000 mg | SUBCUTANEOUS | Status: DC
  Administered 2017-03-19 – 2017-03-23 (×5): 30 mg via SUBCUTANEOUS
  Filled 2017-03-19 (×5): qty 0.3

## 2017-03-19 MED ORDER — PIPERACILLIN-TAZOBACTAM 3.375 G IVPB
3.3750 g | Freq: Three times a day (TID) | INTRAVENOUS | Status: DC
  Administered 2017-03-19 – 2017-03-22 (×9): 3.375 g via INTRAVENOUS
  Filled 2017-03-19 (×10): qty 50

## 2017-03-19 MED ORDER — PIPERACILLIN-TAZOBACTAM 3.375 G IVPB 30 MIN
3.3750 g | Freq: Once | INTRAVENOUS | Status: AC
  Administered 2017-03-19: 3.375 g via INTRAVENOUS
  Filled 2017-03-19: qty 50

## 2017-03-19 MED ORDER — ZOLPIDEM TARTRATE 5 MG PO TABS
5.0000 mg | ORAL_TABLET | Freq: Every day | ORAL | Status: DC
  Administered 2017-03-19 – 2017-03-23 (×5): 5 mg via ORAL
  Filled 2017-03-19 (×5): qty 1

## 2017-03-19 MED ORDER — BISACODYL 5 MG PO TBEC: 10.0000 mg | DELAYED_RELEASE_TABLET | Freq: Every day | ORAL | Status: DC | PRN

## 2017-03-19 MED ORDER — ACETAMINOPHEN 325 MG PO TABS: 650.0000 mg | ORAL_TABLET | Freq: Four times a day (QID) | ORAL | Status: DC | PRN

## 2017-03-19 MED ORDER — POLYETHYLENE GLYCOL 3350 17 G PO PACK: 17.0000 g | PACK | Freq: Every day | ORAL | Status: DC | PRN

## 2017-03-19 MED ORDER — SODIUM CHLORIDE 0.9 % IV BOLUS (SEPSIS)
500.0000 mL | Freq: Once | INTRAVENOUS | Status: AC
  Administered 2017-03-19: 500 mL via INTRAVENOUS

## 2017-03-19 NOTE — Progress Notes (Signed)
A consult was received from an ED physician for vancomycin and zosyn per pharmacy dosing.  The patient's profile has been reviewed for ht/wt/allergies/indication/available labs.   A one time order has been placed for vancomycin and zosyn.  Further antibiotics/pharmacy consults should be ordered by admitting physician if indicated.                       Thank you,  Dia Sitter, PharmD, BCPS 03/19/2017 4:55 AM

## 2017-03-19 NOTE — ED Notes (Signed)
Attempted to get blood, but was unsuccessful. 

## 2017-03-19 NOTE — Care Management Note (Signed)
Case Management Note  Patient Details  Name: Taylor Kramer MRN: [id number] Date of Birth: August 03, 1931  Subjective/Objective:  83 y.o. female with past medical history significant for CR EST syndrome and hypertension who was in her usual state of health until yesterday when she felt a little weak and had a mechanical fall without injury, with fever and chronic decubitus ulcer.           Action/Plan: CM consulted for possible HHS at ALF on D/C.  CSW consulted to follow as well for D/C planning.  Expected Discharge Date:   (unknown)               Expected Discharge Plan:   (From [facility])  In-House Referral:  Clinical Social Work  Discharge planning Services  CM Consult  Post Acute Care Choice:    Choice offered to:  Patient, Adult Children  Status of Service:  In process, will continue to follow  Corky Crafts, RN 03/19/2017, 10:54 AM

## 2017-03-19 NOTE — ED Notes (Signed)
Please note Db has foul odor and red. Fever present

## 2017-03-19 NOTE — ED Notes (Signed)
Attempted to change pt into gown, but pt said I could not take her pants or slippers off.

## 2017-03-19 NOTE — H&P (Signed)
History and Physical:    Taylor Kramer   ZOX:[id number] DOB: 01-Aug-1931 DOA: 03/19/2017  Referring MD/provider: Glendon Axe PCP: System, Provider Not In   Patient coming from: SNF  Chief Complaint: Fever  History of Present Illness:   Taylor Kramer is an 83 y.o. female with past medical history significant for CR EST syndrome and hypertension who was in her usual state of health until yesterday when she felt a little weak and had a mechanical fall without injury. This morning she was noted to have a fever of 101 in the SNF in which she lives and was sent to ED. Patient herself has no complaints other than perhaps feeling a little weak. She admits to feeling slightly chilly and clammy. Patient denies cough or shortness of breath although notes she is mostly bedbound and goes to meals in a wheelchair. Patient denies abdominal pain diarrhea nausea or vomiting. Patient admits to pain in her lower back where she knows she has a decubitus ulcer, no change in the pain. Patient denies dysuria and notes she wears Depends. Denies headache or confusion.  ED Course:  Code sepsis was called and patient was treated with 1750 mL normal saline and vancomycin and Zosyn.    ROS:   ROS  Past Medical History:   Past Medical History:  Diagnosis Date  . CREST syndrome ([place])   . Hypertension   . Insomnia   . Neuropathy   . Osteoporosis     Past Surgical History:   Past Surgical History:  Procedure Laterality Date  . ABDOMINAL HYSTERECTOMY    . CHOLECYSTECTOMY    . renal stones      Social History:   Social History   Social History  . Marital status: Married    Spouse name: N/A  . Number of children: N/A  . Years of education: N/A   Occupational History  . Not on file.   Social History Main Topics  . Smoking status: Former Research scientist (life sciences)  . Smokeless tobacco: Never Used  . Alcohol use Yes     Comment: one drink a wek  . Drug use: No  . Sexual activity: No   Other Topics Concern  .  Not on file   Social History Narrative  . No narrative on file    Allergies   Patient has no known allergies.  Family history:   Family History  Problem Relation Age of Onset  . Neuropathy Brother     Current Medications:   Prior to Admission medications   Medication Sig Start Date End Date Taking? Authorizing Provider  acetaminophen (TYLENOL) 500 MG tablet Take 500 mg by mouth every 6 (six) hours as needed for moderate pain.   Yes [provider]  alendronate (FOSAMAX) 70 MG tablet Take 70 mg by mouth once a week. Take with a full glass of water on an empty stomach on Fridays.   Yes [provider]  amLODipine (NORVASC) 2.5 MG tablet Take 2.5 mg by mouth daily.   Yes [provider]  bisacodyl (DULCOLAX) 5 MG EC tablet Take 10 mg by mouth daily as needed for mild constipation or moderate constipation.   Yes [provider]  cholecalciferol (VITAMIN D) 1000 UNITS tablet Take 1,000 Units by mouth daily.   Yes [provider]  Cranberry 450 MG CAPS Take 1 capsule by mouth 2 (two) times daily.   Yes [provider]  fluticasone (FLONASE) 50 MCG/ACT nasal spray Place 2 sprays into both nostrils daily.  Yes [provider]  loratadine (CLARITIN) 10 MG tablet Take 10 mg by mouth daily as needed for allergies.    Yes [provider]  polyethylene glycol (MIRALAX / GLYCOLAX) packet Take 17 g by mouth daily as needed for mild constipation.   Yes [provider]  Propylene Glycol (SYSTANE BALANCE) 0.6 % SOLN Apply 1 drop to eye 4 (four) times daily.   Yes [provider]  psyllium (KONSYL) 0.52 G capsule Take 0.52 g by mouth daily.   Yes [provider]  zolpidem (AMBIEN) 10 MG tablet Take 10 mg by mouth at bedtime.    Yes [provider]  amoxicillin (AMOXIL) 500 MG capsule Take 1 capsule (500 mg total) by mouth every 8 (eight) hours. Patient not taking: Reported on 03/19/2017 11/24/14    [name], MD    Physical Exam:   Vitals:   03/19/17 0456 03/19/17 0554 03/19/17 0636 03/19/17 0710  BP: 130/69  112/74 112/74  Pulse: 77  85 71  Resp: 19  16 10   Temp: 98.3 F (36.8 C)     TempSrc: Oral     SpO2: 95% 96% 95% 95%  Weight:      Height:         Physical Exam: Blood pressure 112/74, pulse 71, temperature 98.3 F (36.8 C), temperature source Oral, resp. rate 10, height 5\' 7"  (1.702 m), weight 44.9 kg (99 lb), SpO2 95 %. Gen: Very thin female sitting up in bed in no distress, conversant and cooperative. Head: Market bitemporal wasting Eyes: Pupils equal, round and reactive to light. Extraocular movements intact.  Sclerae nonicteric.  Mouth: Has irregular teeth with moderate dentition. Neck: No jugular venous distention. Chest: Moderate air entry bilaterally. Few bibasilar rales  CV: Distant and regular. Abdomen: Soft, nontender, nondistended with normal active bowel sounds. Extremities: Extremities are without clubbing, or cyanosis. No edema. Skin: Patient has a foul-smelling ulceration in right presacral area. There is a 3 cm area of marked induration surrounded by dark discoloration and 2 holes which appear to probe to bone. No purulence elicited from that area, but there is concern for necrosis versus gangrene. She does have tenderness that is not out of proportion to lesion. No crepitus. Neuro: Alert and oriented times 3; grossly nonfocal.  Psych: Insight is good and judgment is appropriate. Mood and affect normal.   Data Review:    Labs: Basic Metabolic Panel:  Recent Labs Lab 03/19/17 0424  NA 139  K 4.2  CL 102  CO2 30  GLUCOSE 92  BUN 24*  CREATININE 0.82  CALCIUM 8.3*   Liver Function Tests:  Recent Labs Lab 03/19/17 0424  AST 22  ALT 10*  ALKPHOS 48  BILITOT 1.2  PROT 6.3*  ALBUMIN 3.0*   No results for input(s): LIPASE, AMYLASE in the last 168 hours. No results for input(s): AMMONIA in the last 168 hours. CBC:  Recent  Labs Lab 03/19/17 0424  WBC 15.8*  NEUTROABS 13.2*  HGB 10.8*  HCT 33.9*  MCV 84.5  PLT 355   Cardiac Enzymes: No results for input(s): CKTOTAL, CKMB, CKMBINDEX, TROPONINI in the last 168 hours.  BNP (last 3 results) No results for input(s): PROBNP in the last 8760 hours. CBG: No results for input(s): GLUCAP in the last 168 hours.  Urinalysis    Component Value Date/Time   COLORURINE YELLOW 03/19/2017 0424   APPEARANCEUR CLEAR 03/19/2017 0424   LABSPEC 1.013 03/19/2017 0424   PHURINE 6.0 03/19/2017 0424  GLUCOSEU NEGATIVE 03/19/2017 0424   HGBUR MODERATE (A) 03/19/2017 0424   BILIRUBINUR NEGATIVE 03/19/2017 0424   KETONESUR NEGATIVE 03/19/2017 0424   PROTEINUR NEGATIVE 03/19/2017 0424   UROBILINOGEN 1.0 11/21/2014 2046   NITRITE NEGATIVE 03/19/2017 0424   LEUKOCYTESUR SMALL (A) 03/19/2017 0424      Radiographic Studies: Ct Abdomen Pelvis W Contrast  Result Date: 03/19/2017 CLINICAL DATA:  Worsening decubitus ulcer over the past year. Fever and foul odor. EXAM: CT ABDOMEN AND PELVIS WITH CONTRAST TECHNIQUE: Multidetector CT imaging of the abdomen and pelvis was performed using the standard protocol following bolus administration of intravenous contrast. CONTRAST:  80 cc Isovue-300 IV COMPARISON:  None. FINDINGS: Lower chest: Motion artifact the lung bases. Probable atelectasis no dependent right lower lobe. Hepatobiliary: Postcholecystectomy with clips in the gallbladder fossa. There is intra and extrahepatic biliary ductal dilatation, common bile duct measures approximately 10 mm distally. Probable cysts in the inferior right lobe of the liver. Pancreas: Mild pancreatic ductal dilatation measuring 5 mm. Parenchymal atrophy. No peripancreatic inflammation. No discrete lesion is seen. Spleen: Normal in size without focal abnormality. Adrenals/Urinary Tract: No adrenal nodule. Staghorn 3.1 cm calculus in the left renal pelvis with chronic hydronephrosis and marked renal  parenchymal atrophy. Additional nonobstructing stones scattered throughout the left kidney. Dilatation of right renal pelvis with transition at the ureteropelvic junction. No right perinephric edema. Simple cyst in the lower right kidney. Urinary bladder is displaced anteriorly due to large stool ball in the rectum. Stomach/Bowel: Lack of enteric contrast and paucity intra-abdominal fat limits assessment. No bowel dilatation to suggest obstruction. Colonic tortuosity with moderate colonic stool burden. Large stool ball distends the distal sigmoid colon and rectum spinning 10.3 cm. Mild associated ictal wall thickening. The appendix is tentatively identified and normal. Vascular/Lymphatic: Aortic atherosclerosis without aneurysm. Limited assessment for adenopathy given paucity of intra-abdominal fat and lack of enteric contrast, no bulky adenopathy is seen. Reproductive: Status post hysterectomy. No adnexal masses. Other: No ascites or free air. Musculoskeletal: Skin thickening with associated defect/ ulcer posterior to the left aspect of the sacrum. Surrounding edematous changes with no discrete fluid collection. No periosteal reaction or bony destructive change of the underlying sacrum to suggest osteomyelitis. There is degenerative change in the spine. Remote posterior left twelfth rib fracture. IMPRESSION: 1. Skin thickening and subcutaneous edema with soft tissue ulcer overlying the left sacrum. No abscess or CT findings of osteomyelitis. 2. Large stool ball distending the rectosigmoid colon with rectal wall thickening, consistent with fecal impaction. 3. Staghorn calculus in the left renal pelvis with chronic hydronephrosis and renal parenchymal atrophy. 4. Postcholecystectomy with intra and extrahepatic biliary ductal dilatation. Recommend correlation with LFTs, if elevated consider further evaluation with MRCP, ERCP or EUS. Additionally there is mild pancreatic ductal dilatation. Pancreatic head mass not  excluded, but not seen on CT. 5. Aortic atherosclerosis. Electronically Signed   By: Jeb Levering M.D.   On: 03/19/2017 06:39   Dg Chest Port 1 View  Result Date: 03/19/2017 CLINICAL DATA:  New onset fever.  Cough. EXAM: PORTABLE CHEST 1 VIEW COMPARISON:  Chest radiograph 11/21/2014 FINDINGS: Left basilar atelectasis. No focal consolidation. Unchanged appearance of the cardio mediastinum. No pleural effusion or pneumothorax. IMPRESSION: Left basilar atelectasis. Electronically Signed   By: Ulyses Jarred M.D.   On: 03/19/2017 04:51    EKG: Independently reviewed. Normal sinus rhythm at 88 with left axis deviation. No acute ST-T wave changes. Normal R-wave progression.   Assessment/Plan:   Principal Problem:   Decubitus  ulcer of back Active Problems:   Hypertension   History of CREST syndrome   Fecal impaction (H[facility])  DECUBITUS ULCER Foul-smelling decubitus with market induration and black discoloration concerning for necrosis. CT scan without evidence of fluid collection, osteomyelitis or gas. General surgery consultation requested and pending Continue vancomycin and Zosyn as started in the ED. Day #1 today  HTN Amlodipine 2.5 is being held until infection can be brought under control.  FECAL IMPACTION Found on CT.  Patient is asymptomatic Can consider starting enemas once plan is made for treatment of ulcer.  H/O CREST Continue eyedrops. Soft solids diet ordered, can advance to dysphagia diet as needed.   Other information:   DVT prophylaxis: Lovenox ordered. Code Status: Full code. Family Communication: Spoke with son Ena Dawley about patient's condition, plans and DO NOT RESUSCITATE status. Disposition Plan: SNF Consults called: General surgery Admission status: Inpatient   The medical decision making is of moderate complexity, therefore this is a level 2 visit.  Dewaine Oats Derek Jack [facility] Pager [phone] Cell: [phone]   If  7PM-7AM, please contact night-coverage [email or web address] Password [email or web address] 03/19/2017, 10:20 AM

## 2017-03-19 NOTE — ED Notes (Signed)
Bed: PY09 Expected date:  Expected time:  Means of arrival:  Comments: EMS 83 yo female from [place] at [facility] left hip/fever 101

## 2017-03-19 NOTE — ED Triage Notes (Signed)
Pt comes from [place] at [facility], c/o  Is pain of 6 out 10 on left buttocks ( associated chronic wound/ pressure sore)  Pt developed new onset fever of 101 at facility within the last hours ( noted at 11pm). Pt has a hx of UTIs, full code, no known allergies, pt uses a walker and alert x4. Hx of HTN.  Iv 20 in RAC 500 NS given. 129/78, pulse 102, rr18, spo2 95 room air, cbg 141.  12 lead sinus tachy.

## 2017-03-19 NOTE — ED Provider Notes (Signed)
[facility] DEPT Provider Note   CSN: [id number] Arrival date & time: 03/19/17  0210  Time seen 03:55 AM   History   Chief Complaint Chief Complaint  Patient presents with  . Fever  . Blood Infection    left buttocks     HPI Devin Foskey is a 83 y.o. female.  HPI  when asked was going on patient states "lots of things". She states she's had a open area on her left buttock for the past year. She states it is getting more painful. She states she overheard them talking that she had a fever but she does not know what it was. She denies having chills. She states she's had a cough for a year. She denies sore throat, rhinorrhea, nausea, vomiting, diarrhea, dysuria, or frequency.  PCP Dr Leeroy Cha   Past Medical History:  Diagnosis Date  . CREST syndrome ([place])   . Hypertension   . Insomnia   . Neuropathy   . Osteoporosis     Patient Active Problem List   Diagnosis Date Noted  . UTI (lower urinary tract infection) 11/21/2014  . Near syncope 11/21/2014  . Hypertension 11/21/2014  . History of CREST syndrome 11/21/2014  . Acute UTI     Past Surgical History:  Procedure Laterality Date  . ABDOMINAL HYSTERECTOMY    . CHOLECYSTECTOMY    . renal stones      OB History    No data available       Home Medications    Prior to Admission medications   Medication Sig Start Date End Date Taking? Authorizing Provider  acetaminophen (TYLENOL) 500 MG tablet Take 500 mg by mouth every 6 (six) hours as needed for moderate pain.   Yes [provider]  alendronate (FOSAMAX) 70 MG tablet Take 70 mg by mouth once a week. Take with a full glass of water on an empty stomach on Fridays.   Yes [provider]  amLODipine (NORVASC) 2.5 MG tablet Take 2.5 mg by mouth daily.   Yes [provider]  bisacodyl (DULCOLAX) 5 MG EC tablet Take 10 mg by mouth daily as needed for mild constipation or moderate constipation.   Yes [provider]    cholecalciferol (VITAMIN D) 1000 UNITS tablet Take 1,000 Units by mouth daily.   Yes [provider]  Cranberry 450 MG CAPS Take 1 capsule by mouth 2 (two) times daily.   Yes [provider]  fluticasone (FLONASE) 50 MCG/ACT nasal spray Place 2 sprays into both nostrils daily.   Yes [provider]  loratadine (CLARITIN) 10 MG tablet Take 10 mg by mouth daily as needed for allergies.    Yes [provider]  polyethylene glycol (MIRALAX / GLYCOLAX) packet Take 17 g by mouth daily as needed for mild constipation.   Yes [provider]  Propylene Glycol (SYSTANE BALANCE) 0.6 % SOLN Apply 1 drop to eye 4 (four) times daily.   Yes [provider]  psyllium (KONSYL) 0.52 G capsule Take 0.52 g by mouth daily.   Yes [provider]  zolpidem (AMBIEN) 10 MG tablet Take 10 mg by mouth at bedtime.    Yes [provider]  amoxicillin (AMOXIL) 500 MG capsule Take 1 capsule (500 mg total) by mouth every 8 (eight) hours. Patient not taking: Reported on 03/19/2017 11/24/14   [name], MD    Family History Family History  Problem Relation Age of Onset  . Neuropathy Brother     Social  History Social History  Substance Use Topics  . Smoking status: Former Research scientist (life sciences)  . Smokeless tobacco: Not on file  . Alcohol use Yes     Comment: one drink a wek  lives in a nursing facility Uses a walker   Allergies   Patient has no known allergies.   Review of Systems Review of Systems  All other systems reviewed and are negative.    Physical Exam Updated Vital Signs BP 124/80 (BP Location: Left Arm)   Pulse 100   Temp 100 F (37.8 C) (Rectal)   Resp 17   SpO2 96%   Vital signs normal Except borderline temperature and pulse   Physical Exam  Constitutional: She is oriented to person, place, and time.  Non-toxic appearance. She does not appear ill.  Thin elderly female who appears younger than her stated age  HENT:  Head:  Normocephalic and atraumatic.  Right Ear: External ear normal.  Left Ear: External ear normal.  Nose: Nose normal. No mucosal edema or rhinorrhea.  Mouth/Throat: Oropharynx is clear and moist and mucous membranes are normal. No dental abscesses or uvula swelling.  Eyes: Pupils are equal, round, and reactive to light. Conjunctivae and EOM are normal.  Neck: Normal range of motion and full passive range of motion without pain. Neck supple.  Cardiovascular: Normal rate, regular rhythm and normal heart sounds.  Exam reveals no gallop and no friction rub.   No murmur heard. Pulmonary/Chest: Effort normal and breath sounds normal. No respiratory distress. She has no wheezes. She has no rhonchi. She has no rales. She exhibits no tenderness and no crepitus.  Abdominal: Soft. Normal appearance and bowel sounds are normal. She exhibits no distension. There is no tenderness. There is no rebound and no guarding.  Genitourinary:  Genitourinary Comments: Patient is noted to have a decubitus ulcer on her medial left buttock with an open area surrounded by some black tissue and then an area of redness. It has a foul odor  Musculoskeletal: Normal range of motion. She exhibits no edema or tenderness.  Moves all extremities well.   Neurological: She is alert and oriented to person, place, and time. She has normal strength. No cranial nerve deficit.  Skin: Skin is warm, dry and intact. No rash noted. No erythema. No pallor.  Psychiatric: She has a normal mood and affect. Her speech is normal and behavior is normal. Her mood appears not anxious.  Nursing note and vitals reviewed.      ED Treatments / Results  Labs (all labs ordered are listed, but only abnormal results are displayed) Results for orders placed or performed during the hospital encounter of 03/19/17  Comprehensive metabolic panel  Result Value Ref Range   Sodium 139 135 - 145 mmol/L   Potassium 4.2 3.5 - 5.1 mmol/L   Chloride 102 101 - 111  mmol/L   CO2 30 22 - 32 mmol/L   Glucose, Bld 92 65 - 99 mg/dL   BUN 24 (H) 6 - 20 mg/dL   Creatinine, Ser 0.82 0.44 - 1.00 mg/dL   Calcium 8.3 (L) 8.9 - 10.3 mg/dL   Total Protein 6.3 (L) 6.5 - 8.1 g/dL   Albumin 3.0 (L) 3.5 - 5.0 g/dL   AST 22 15 - 41 U/L   ALT 10 (L) 14 - 54 U/L   Alkaline Phosphatase 48 38 - 126 U/L   Total Bilirubin 1.2 0.3 - 1.2 mg/dL   GFR calc non Af Amer >60 >60 mL/min   GFR  calc Af Amer >60 >60 mL/min   Anion gap 7 5 - 15  CBC WITH DIFFERENTIAL  Result Value Ref Range   WBC 15.8 (H) 4.0 - 10.5 K/uL   RBC 4.01 3.87 - 5.11 MIL/uL   Hemoglobin 10.8 (L) 12.0 - 15.0 g/dL   HCT 33.9 (L) 36.0 - 46.0 %   MCV 84.5 78.0 - 100.0 fL   MCH 26.9 26.0 - 34.0 pg   MCHC 31.9 30.0 - 36.0 g/dL   RDW 14.5 11.5 - 15.5 %   Platelets 355 150 - 400 K/uL   Neutrophils Relative % 83 %   Neutro Abs 13.2 (H) 1.7 - 7.7 K/uL   Lymphocytes Relative 9 %   Lymphs Abs 1.4 0.7 - 4.0 K/uL   Monocytes Relative 8 %   Monocytes Absolute 1.2 (H) 0.1 - 1.0 K/uL   Eosinophils Relative 0 %   Eosinophils Absolute 0.0 0.0 - 0.7 K/uL   Basophils Relative 0 %   Basophils Absolute 0.0 0.0 - 0.1 K/uL  Urinalysis, Routine w reflex microscopic  Result Value Ref Range   Color, Urine YELLOW YELLOW   APPearance CLEAR CLEAR   Specific Gravity, Urine 1.013 1.005 - 1.030   pH 6.0 5.0 - 8.0   Glucose, UA NEGATIVE NEGATIVE mg/dL   Hgb urine dipstick MODERATE (A) NEGATIVE   Bilirubin Urine NEGATIVE NEGATIVE   Ketones, ur NEGATIVE NEGATIVE mg/dL   Protein, ur NEGATIVE NEGATIVE mg/dL   Nitrite NEGATIVE NEGATIVE   Leukocytes, UA SMALL (A) NEGATIVE   RBC / HPF 0-5 0 - 5 RBC/hpf   WBC, UA 6-30 0 - 5 WBC/hpf   Bacteria, UA RARE (A) NONE SEEN   Squamous Epithelial / LPF 0-5 (A) NONE SEEN   Mucus PRESENT    Hyaline Casts, UA PRESENT   I-Stat CG4 Lactic Acid, ED  (not at  [facility])  Result Value Ref Range   Lactic Acid, Venous 1.18 0.5 - 1.9 mmol/L   Laboratory interpretation all normal except  Leukocytosis     EKG  EKG Interpretation  Date/Time:  Wednesday March 19 2017 04:55:18 EDT Ventricular Rate:  80 PR Interval:    QRS Duration: 110 QT Interval:  418 QTC Calculation: 483 R Axis:   -83 Text Interpretation:  Sinus rhythm LAD, consider left anterior fascicular block Baseline wander No significant change since last tracing 21 Nov 2014 Confirmed by Rolland Porter [id number]) on 03/19/2017 5:58:34 AM Also confirmed by Rolland Porter [phone]), editor Hattie Perch ([id number])  on 03/19/2017 7:21:52 AM       Radiology Ct Abdomen Pelvis W Contrast  Result Date: 03/19/2017 CLINICAL DATA:  Worsening decubitus ulcer over the past year. Fever and foul odor. EXAM: CT ABDOMEN AND PELVIS WITH CONTRAST TECHNIQUE: Multidetector CT imaging of the abdomen and pelvis was performed using the standard protocol following bolus administration of intravenous contrast. CONTRAST:  80 cc Isovue-300 IV COMPARISON:  None. FINDINGS: Lower chest: Motion artifact the lung bases. Probable atelectasis no dependent right lower lobe. Hepatobiliary: Postcholecystectomy with clips in the gallbladder fossa. There is intra and extrahepatic biliary ductal dilatation, common bile duct measures approximately 10 mm distally. Probable cysts in the inferior right lobe of the liver. Pancreas: Mild pancreatic ductal dilatation measuring 5 mm. Parenchymal atrophy. No peripancreatic inflammation. No discrete lesion is seen. Spleen: Normal in size without focal abnormality. Adrenals/Urinary Tract: No adrenal nodule. Staghorn 3.1 cm calculus in the left renal pelvis with chronic hydronephrosis and marked renal parenchymal atrophy. Additional nonobstructing stones scattered throughout  the left kidney. Dilatation of right renal pelvis with transition at the ureteropelvic junction. No right perinephric edema. Simple cyst in the lower right kidney. Urinary bladder is displaced anteriorly due to large stool ball in the rectum. Stomach/Bowel: Lack  of enteric contrast and paucity intra-abdominal fat limits assessment. No bowel dilatation to suggest obstruction. Colonic tortuosity with moderate colonic stool burden. Large stool ball distends the distal sigmoid colon and rectum spinning 10.3 cm. Mild associated ictal wall thickening. The appendix is tentatively identified and normal. Vascular/Lymphatic: Aortic atherosclerosis without aneurysm. Limited assessment for adenopathy given paucity of intra-abdominal fat and lack of enteric contrast, no bulky adenopathy is seen. Reproductive: Status post hysterectomy. No adnexal masses. Other: No ascites or free air. Musculoskeletal: Skin thickening with associated defect/ ulcer posterior to the left aspect of the sacrum. Surrounding edematous changes with no discrete fluid collection. No periosteal reaction or bony destructive change of the underlying sacrum to suggest osteomyelitis. There is degenerative change in the spine. Remote posterior left twelfth rib fracture. IMPRESSION: 1. Skin thickening and subcutaneous edema with soft tissue ulcer overlying the left sacrum. No abscess or CT findings of osteomyelitis. 2. Large stool ball distending the rectosigmoid colon with rectal wall thickening, consistent with fecal impaction. 3. Staghorn calculus in the left renal pelvis with chronic hydronephrosis and renal parenchymal atrophy. 4. Postcholecystectomy with intra and extrahepatic biliary ductal dilatation. Recommend correlation with LFTs, if elevated consider further evaluation with MRCP, ERCP or EUS. Additionally there is mild pancreatic ductal dilatation. Pancreatic head mass not excluded, but not seen on CT. 5. Aortic atherosclerosis. Electronically Signed   By: Jeb Levering M.D.   On: 03/19/2017 06:39   Dg Chest Port 1 View  Result Date: 03/19/2017 CLINICAL DATA:  New onset fever.  Cough. EXAM: PORTABLE CHEST 1 VIEW COMPARISON:  Chest radiograph 11/21/2014 FINDINGS: Left basilar atelectasis. No focal  consolidation. Unchanged appearance of the cardio mediastinum. No pleural effusion or pneumothorax. IMPRESSION: Left basilar atelectasis. Electronically Signed   By: Ulyses Jarred M.D.   On: 03/19/2017 04:51    Procedures Procedures (including critical care time)  Medications Ordered in ED Medications  sodium chloride 0.9 % bolus 1,000 mL (0 mLs Intravenous Stopped 03/19/17 0702)    And  sodium chloride 0.9 % bolus 500 mL (0 mLs Intravenous Stopped 03/19/17 0525)    And  sodium chloride 0.9 % bolus 250 mL (0 mLs Intravenous Stopped 03/19/17 0702)  piperacillin-tazobactam (ZOSYN) IVPB 3.375 g (0 g Intravenous Stopped 03/19/17 0527)  vancomycin (VANCOCIN) IVPB 1000 mg/200 mL premix (0 mg Intravenous Stopped 03/19/17 0702)  fentaNYL (SUBLIMAZE) injection 50 mcg (50 mcg Intravenous Given 03/19/17 0455)  iopamidol (ISOVUE-300) 61 % injection 100 mL (80 mLs Intravenous Contrast Given 03/19/17 0611)     Initial Impression / Assessment and Plan / ED Course  I have reviewed the triage vital signs and the nursing notes.  Pertinent labs & imaging results that were available during my care of the patient were reviewed by me and considered in my medical decision making (see chart for details).     Patient was started on code sepsis protocol without calling code sepsis. She was given IV fluid bolus based on weight, and she was started on cellulitis antibiotics. CT scan of the abdomen pelvis was done to look at the extent of her decubitus ulcer.  07:28 AM Dr Jamse Arn, hospitalist, refuses to see patient or admit until seen by someone like surgery evaluates the patient.   08:20 AM Patient left the  change of shift with Dr. Zenia Resides, waiting for patient to be admitted. Surgery has not responded to their consult yet.  Final Clinical Impressions(s) / ED Diagnoses   Final diagnoses:  Decubitus ulcer of left buttock, unspecified ulcer stage  Cellulitis of left buttock    Plan admission  Rolland Porter, MD,  Barbette Or, MD 03/19/17 [phone]

## 2017-03-19 NOTE — ED Notes (Signed)
Report given to floor rn 

## 2017-03-19 NOTE — Progress Notes (Signed)
Pharmacy Antibiotic Note  Taylor Kramer is a 83 y.o. female admitted on 03/19/2017 with wound infection.  Pharmacy has been consulted for vancomycin dosing. Patient noted to have decubitus ulcer that is concerning for necrosis.  Vancomycin and zosyn to continue  Today, 03/19/2017  Renal: Scr WNL (Normalized CrCl = 29ml/min)  WBC WNL  No evidence of osteomyelitis or abscess per CT  Plan:  Vancomycin 750mg  IV q24h  Check trough at steady  Daily SCr for increased nephrotoxicity with prescribed regimen  Adjust zosyn to 3.375gm IV q8h over 4h infusion    Height: 5\' 7"  (170.2 cm) Weight: 99 lb (44.9 kg) IBW/kg (Calculated) : 61.6  Temp (24hrs), Avg:99.2 F (37.3 C), Min:98.3 F (36.8 C), Max:100 F (37.8 C)   Recent Labs Lab 03/19/17 0424 03/19/17 0443  WBC 15.8*  --   CREATININE 0.82  --   LATICACIDVEN  --  1.18    Estimated Creatinine Clearance: 36.8 mL/min (by C-G formula based on SCr of 0.82 mg/dL).    No Known Allergies  Antimicrobials this admission: 8/22 vancomycin >>  8/22 zosyn >>   Dose adjustments this admission:   Microbiology results: 8/22 BCx:  8/22 UCx:     Thank you for allowing pharmacy to be a part of this patient's care.  Doreene Eland, PharmD, BCPS.   Pager: [phone] 03/19/2017 11:03 AM

## 2017-03-20 DIAGNOSIS — L89329 Pressure ulcer of left buttock, unspecified stage: Secondary | ICD-10-CM

## 2017-03-20 DIAGNOSIS — K5641 Fecal impaction: Secondary | ICD-10-CM

## 2017-03-20 LAB — BLOOD CULTURE ID PANEL (REFLEXED)
Acinetobacter baumannii: NOT DETECTED
CANDIDA ALBICANS: NOT DETECTED
CANDIDA KRUSEI: NOT DETECTED
CANDIDA PARAPSILOSIS: NOT DETECTED
CANDIDA TROPICALIS: NOT DETECTED
Candida glabrata: NOT DETECTED
Carbapenem resistance: NOT DETECTED
ENTEROBACTER CLOACAE COMPLEX: NOT DETECTED
ENTEROBACTERIACEAE SPECIES: NOT DETECTED
ENTEROCOCCUS SPECIES: NOT DETECTED
Escherichia coli: NOT DETECTED
HAEMOPHILUS INFLUENZAE: NOT DETECTED
Klebsiella oxytoca: NOT DETECTED
Klebsiella pneumoniae: NOT DETECTED
Listeria monocytogenes: NOT DETECTED
Methicillin resistance: DETECTED — AB
Neisseria meningitidis: NOT DETECTED
PROTEUS SPECIES: NOT DETECTED
Pseudomonas aeruginosa: NOT DETECTED
STAPHYLOCOCCUS AUREUS BCID: NOT DETECTED
STREPTOCOCCUS AGALACTIAE: NOT DETECTED
STREPTOCOCCUS PNEUMONIAE: NOT DETECTED
STREPTOCOCCUS PYOGENES: NOT DETECTED
Serratia marcescens: NOT DETECTED
Staphylococcus species: DETECTED — AB
Streptococcus species: NOT DETECTED
VANCOMYCIN RESISTANCE: NOT DETECTED

## 2017-03-20 LAB — CBC
HCT: 33.6 % — ABNORMAL LOW (ref 36.0–46.0)
HEMOGLOBIN: 10.8 g/dL — AB (ref 12.0–15.0)
MCH: 27.2 pg (ref 26.0–34.0)
MCHC: 32.1 g/dL (ref 30.0–36.0)
MCV: 84.6 fL (ref 78.0–100.0)
PLATELETS: 282 10*3/uL (ref 150–400)
RBC: 3.97 MIL/uL (ref 3.87–5.11)
RDW: 14.5 % (ref 11.5–15.5)
WBC: 8.7 10*3/uL (ref 4.0–10.5)

## 2017-03-20 LAB — URINE CULTURE

## 2017-03-20 LAB — BASIC METABOLIC PANEL
ANION GAP: 4 — AB (ref 5–15)
BUN: 12 mg/dL (ref 6–20)
CALCIUM: 8.2 mg/dL — AB (ref 8.9–10.3)
CHLORIDE: 105 mmol/L (ref 101–111)
CO2: 30 mmol/L (ref 22–32)
CREATININE: 0.69 mg/dL (ref 0.44–1.00)
GFR calc non Af Amer: >60 mL/min (ref >60)
Glucose, Bld: 113 mg/dL — ABNORMAL HIGH (ref 65–99)
Potassium: 3.6 mmol/L (ref 3.5–5.1)
SODIUM: 139 mmol/L (ref 135–145)

## 2017-03-20 LAB — PROTIME-INR
INR: 0.97
PROTHROMBIN TIME: 12.9 s (ref 11.4–15.2)

## 2017-03-20 MED ORDER — MORPHINE SULFATE (PF) 2 MG/ML IV SOLN
2.0000 mg | INTRAVENOUS | Status: DC | PRN
  Administered 2017-03-20 – 2017-03-21 (×3): 2 mg via INTRAVENOUS
  Filled 2017-03-20 (×3): qty 1

## 2017-03-20 MED ORDER — SODIUM CHLORIDE 0.45 % IV SOLN
INTRAVENOUS | Status: DC
  Administered 2017-03-20: 20:00:00 via INTRAVENOUS

## 2017-03-20 MED ORDER — POLYETHYLENE GLYCOL 3350 17 G PO PACK
17.0000 g | PACK | Freq: Two times a day (BID) | ORAL | Status: DC
  Administered 2017-03-20 – 2017-03-23 (×6): 17 g via ORAL
  Filled 2017-03-20 (×6): qty 1

## 2017-03-20 NOTE — Consult Note (Signed)
[facility] Nurse wound consult note Reason for Consult:Surgery has consulted and has ordered debridement and NS moist gauze dressings after.  [facility] team will not follow.  Will not follow at this time.  Please re-consult if needed.  Domenic Moras RN BSN [facility] Pager [phone]

## 2017-03-20 NOTE — Progress Notes (Signed)
PROGRESS NOTE    Taylor Kramer  PPI:[id number] DOB: [birth year] DOA: 03/19/2017 PCP: Leeroy Cha, MD  Brief Narrative: 83 year old female with history of hypertension and crest syndrome, wheelchair bound, from a local assisted living facility was admitted 8/22 with fever or chills weakness and increased pain at the site of her sacral decubitus ulcer that she has had for about a year. Blood cultures positive for staph, CT scan without evidence of deep abscess or osteomyelitis   Assessment & Plan:   Wound infection/secondary infection of sacral decubitus ulcer with eschar/ necrotic appearing tissue -Continue current broad-spectrum antibiotics -Blood culture with MRSA in 1/ 2, await further speciation -General surgery consult requested for possible debridement -CT scan without evidence of osteomyelitis or abscess -Check pre-albumin    Hypertension -Stable, low-dose amlodipine on hold    History of CREST syndrome -Continue eyedrops, soft diet per home regimen    Fecal impaction ([place]) -Start enema pending surgical evaluation of sacral wound  DVT prophylaxis: Lovenox Code Status: DO NOT RESUSCITATE Family Communication: No family at bedside Disposition Plan: Likely back to assisted living pending above workup/management  Consultants:   CCS  Antimicrobials:   Vancomycin and Zosyn   Subjective: Feels about the same, some pain at the site of the wound  Objective: Vitals:   03/19/17 1805 03/19/17 2109 03/20/17 0543 03/20/17 1314  BP: (!) 148/81 138/80 (!) 153/75 124/67  Pulse: 84 80 94 90  Resp: 18 16  18   Temp: 98.7 F (37.1 C) 98.2 F (36.8 C) 98.6 F (37 C) 98.7 F (37.1 C)  TempSrc: Oral Axillary Oral Oral  SpO2: 96% 98% 96% 95%  Weight:      Height:        Intake/Output Summary (Last 24 hours) at 03/20/17 1417 Last data filed at 03/20/17 1252  Gross per 24 hour  Intake            677.5 ml  Output             1800 ml  Net          -1122.5 ml    Filed Weights   03/19/17 0448  Weight: 44.9 kg (99 lb)    Examination:  General exam: Thinly built, frail female, cachectic and dysarthric  Respiratory system: Clear to auscultation. Respiratory effort normal. Cardiovascular system: S1 & S2 heard, RRR. No JVD, murmurs Gastrointestinal system: Abdomen is nondistended, soft and nontender.  Normal bowel sounds heard. Right buttock, with sacral decubitus wound with discoloration/eschar-like area with surrounding redness and inflammation, no purulence or crepitus noted Central nervous system: Alert and oriented. No focal neurological deficits. Extremities: Symmetric 5 x 5 power. Skin: No rashes, lesions or ulcers Psychiatry: Judgement and insight appear normal. Mood & affect appropriate.     Data Reviewed:   CBC:  Recent Labs Lab 03/19/17 0424 03/19/17 1250 03/20/17 0909  WBC 15.8* 11.6* 8.7  NEUTROABS 13.2*  --   --   HGB 10.8* 10.9* 10.8*  HCT 33.9* 34.0* 33.6*  MCV 84.5 83.7 84.6  PLT 355 306 063   Basic Metabolic Panel:  Recent Labs Lab 03/19/17 0424 03/19/17 1250 03/20/17 0909  NA 139  --  139  K 4.2  --  3.6  CL 102  --  105  CO2 30  --  30  GLUCOSE 92  --  113*  BUN 24*  --  12  CREATININE 0.82 0.69 0.69  CALCIUM 8.3*  --  8.2*   GFR: Estimated Creatinine Clearance: 37.8 mL/min (  by C-G formula based on SCr of 0.69 mg/dL). Liver Function Tests:  Recent Labs Lab 03/19/17 0424  AST 22  ALT 10*  ALKPHOS 48  BILITOT 1.2  PROT 6.3*  ALBUMIN 3.0*   No results for input(s): LIPASE, AMYLASE in the last 168 hours. No results for input(s): AMMONIA in the last 168 hours. Coagulation Profile:  Recent Labs Lab 03/20/17 0909  INR 0.97   Cardiac Enzymes: No results for input(s): CKTOTAL, CKMB, CKMBINDEX, TROPONINI in the last 168 hours. BNP (last 3 results) No results for input(s): PROBNP in the last 8760 hours. HbA1C: No results for input(s): HGBA1C in the last 72 hours. CBG: No results for  input(s): GLUCAP in the last 168 hours. Lipid Profile: No results for input(s): CHOL, HDL, LDLCALC, TRIG, CHOLHDL, LDLDIRECT in the last 72 hours. Thyroid Function Tests: No results for input(s): TSH, T4TOTAL, FREET4, T3FREE, THYROIDAB in the last 72 hours. Anemia Panel: No results for input(s): VITAMINB12, FOLATE, FERRITIN, TIBC, IRON, RETICCTPCT in the last 72 hours. Urine analysis:    Component Value Date/Time   COLORURINE YELLOW 03/19/2017 0424   APPEARANCEUR CLEAR 03/19/2017 0424   LABSPEC 1.013 03/19/2017 0424   PHURINE 6.0 03/19/2017 0424   GLUCOSEU NEGATIVE 03/19/2017 0424   HGBUR MODERATE (A) 03/19/2017 0424   BILIRUBINUR NEGATIVE 03/19/2017 0424   KETONESUR NEGATIVE 03/19/2017 0424   PROTEINUR NEGATIVE 03/19/2017 0424   UROBILINOGEN 1.0 11/21/2014 2046   NITRITE NEGATIVE 03/19/2017 0424   LEUKOCYTESUR SMALL (A) 03/19/2017 0424   Sepsis Labs: @LABRCNTIP (procalcitonin:4,lacticidven:4)  ) Recent Results (from the past 240 hour(s))  Blood Culture (routine x 2)     Status: None (Preliminary result)   Collection Time: 03/19/17  4:24 AM  Result Value Ref Range Status   Specimen Description BLOOD RIGHT ANTECUBITAL  Final   Special Requests   Final    BOTTLES DRAWN AEROBIC AND ANAEROBIC Blood Culture adequate volume   Culture  Setup Time   Final    GRAM POSITIVE COCCI IN CLUSTERS IN BOTH AEROBIC AND ANAEROBIC BOTTLES CRITICAL RESULT CALLED TO, READ BACK BY AND VERIFIED WITH: Madilyn Hook 03/20/17 @ [place] Addison Lank Performed at [facility], [place] [address], [place], [place] [address]    Culture GRAM POSITIVE COCCI  Final   Report Status PENDING  Incomplete  Blood Culture (routine x 2)     Status: None (Preliminary result)   Collection Time: 03/19/17  4:24 AM  Result Value Ref Range Status   Specimen Description BLOOD RIGHT WRIST  Final   Special Requests   Final    BOTTLES DRAWN AEROBIC AND ANAEROBIC Blood Culture results may not be optimal due to an inadequate volume of  blood received in culture bottles   Culture   Final    NO GROWTH 1 DAY Performed at [facility], [place] [address] [place], [place] [address]    Report Status PENDING  Incomplete  Urine culture     Status: Abnormal   Collection Time: 03/19/17  4:24 AM  Result Value Ref Range Status   Specimen Description URINE, CATHETERIZED  Final   Special Requests NONE  Final   Culture (A)  Final    <10,000 COLONIES/mL INSIGNIFICANT GROWTH Performed at [facility] [address] [place], [place] [address]    Report Status 03/20/2017 FINAL  Final  Blood Culture ID Panel (Reflexed)     Status: Abnormal   Collection Time: 03/19/17  4:24 AM  Result Value Ref Range Status  Enterococcus species NOT DETECTED NOT DETECTED Final   Vancomycin resistance NOT DETECTED NOT DETECTED Final   Listeria monocytogenes NOT DETECTED NOT DETECTED Final   Staphylococcus species DETECTED (A) NOT DETECTED Final    Comment: CRITICAL RESULT CALLED TO, READ BACK BY AND VERIFIED WITH: M RELZ 03/20/17 @ 7253 M VESTAL    Staphylococcus aureus NOT DETECTED NOT DETECTED Final   Methicillin resistance DETECTED (A) NOT DETECTED Final    Comment: CRITICAL RESULT CALLED TO, READ BACK BY AND VERIFIED WITH: M RELZ 03/20/17 @ 6644 M VESTAL    Streptococcus species NOT DETECTED NOT DETECTED Final   Streptococcus agalactiae NOT DETECTED NOT DETECTED Final   Streptococcus pneumoniae NOT DETECTED NOT DETECTED Final   Streptococcus pyogenes NOT DETECTED NOT DETECTED Final   Acinetobacter baumannii NOT DETECTED NOT DETECTED Final   Enterobacteriaceae species NOT DETECTED NOT DETECTED Final   Enterobacter cloacae complex NOT DETECTED NOT DETECTED Final   Escherichia coli NOT DETECTED NOT DETECTED Final   Klebsiella oxytoca NOT DETECTED NOT DETECTED Final   Klebsiella pneumoniae NOT DETECTED NOT DETECTED Final   Proteus species NOT DETECTED NOT DETECTED Final   Serratia marcescens NOT DETECTED NOT DETECTED Final    Carbapenem resistance NOT DETECTED NOT DETECTED Final   Haemophilus influenzae NOT DETECTED NOT DETECTED Final   Neisseria meningitidis NOT DETECTED NOT DETECTED Final   Pseudomonas aeruginosa NOT DETECTED NOT DETECTED Final   Candida albicans NOT DETECTED NOT DETECTED Final   Candida glabrata NOT DETECTED NOT DETECTED Final   Candida krusei NOT DETECTED NOT DETECTED Final   Candida parapsilosis NOT DETECTED NOT DETECTED Final   Candida tropicalis NOT DETECTED NOT DETECTED Final    Comment: Performed at [facility], [address], [place], [place] [address]         Radiology Studies: Ct Abdomen Pelvis W Contrast  Result Date: 03/19/2017 CLINICAL DATA:  Worsening decubitus ulcer over the past year. Fever and foul odor. EXAM: CT ABDOMEN AND PELVIS WITH CONTRAST TECHNIQUE: Multidetector CT imaging of the abdomen and pelvis was performed using the standard protocol following bolus administration of intravenous contrast. CONTRAST:  80 cc Isovue-300 IV COMPARISON:  None. FINDINGS: Lower chest: Motion artifact the lung bases. Probable atelectasis no dependent right lower lobe. Hepatobiliary: Postcholecystectomy with clips in the gallbladder fossa. There is intra and extrahepatic biliary ductal dilatation, common bile duct measures approximately 10 mm distally. Probable cysts in the inferior right lobe of the liver. Pancreas: Mild pancreatic ductal dilatation measuring 5 mm. Parenchymal atrophy. No peripancreatic inflammation. No discrete lesion is seen. Spleen: Normal in size without focal abnormality. Adrenals/Urinary Tract: No adrenal nodule. Staghorn 3.1 cm calculus in the left renal pelvis with chronic hydronephrosis and marked renal parenchymal atrophy. Additional nonobstructing stones scattered throughout the left kidney. Dilatation of right renal pelvis with transition at the ureteropelvic junction. No right perinephric edema. Simple cyst in the lower right kidney. Urinary bladder is  displaced anteriorly due to large stool ball in the rectum. Stomach/Bowel: Lack of enteric contrast and paucity intra-abdominal fat limits assessment. No bowel dilatation to suggest obstruction. Colonic tortuosity with moderate colonic stool burden. Large stool ball distends the distal sigmoid colon and rectum spinning 10.3 cm. Mild associated ictal wall thickening. The appendix is tentatively identified and normal. Vascular/Lymphatic: Aortic atherosclerosis without aneurysm. Limited assessment for adenopathy given paucity of intra-abdominal fat and lack of enteric contrast, no bulky adenopathy is seen. Reproductive: Status post hysterectomy. No adnexal masses. Other: No ascites or free air.  Musculoskeletal: Skin thickening with associated defect/ ulcer posterior to the left aspect of the sacrum. Surrounding edematous changes with no discrete fluid collection. No periosteal reaction or bony destructive change of the underlying sacrum to suggest osteomyelitis. There is degenerative change in the spine. Remote posterior left twelfth rib fracture. IMPRESSION: 1. Skin thickening and subcutaneous edema with soft tissue ulcer overlying the left sacrum. No abscess or CT findings of osteomyelitis. 2. Large stool ball distending the rectosigmoid colon with rectal wall thickening, consistent with fecal impaction. 3. Staghorn calculus in the left renal pelvis with chronic hydronephrosis and renal parenchymal atrophy. 4. Postcholecystectomy with intra and extrahepatic biliary ductal dilatation. Recommend correlation with LFTs, if elevated consider further evaluation with MRCP, ERCP or EUS. Additionally there is mild pancreatic ductal dilatation. Pancreatic head mass not excluded, but not seen on CT. 5. Aortic atherosclerosis. Electronically Signed   By: Jeb Levering M.D.   On: 03/19/2017 06:39   Dg Chest Port 1 View  Result Date: 03/19/2017 CLINICAL DATA:  New onset fever.  Cough. EXAM: PORTABLE CHEST 1 VIEW COMPARISON:   Chest radiograph 11/21/2014 FINDINGS: Left basilar atelectasis. No focal consolidation. Unchanged appearance of the cardio mediastinum. No pleural effusion or pneumothorax. IMPRESSION: Left basilar atelectasis. Electronically Signed   By: Ulyses Jarred M.D.   On: 03/19/2017 04:51        Scheduled Meds: . enoxaparin (LOVENOX) injection  30 mg Subcutaneous Q24H  . fluticasone  2 spray Each Nare Daily  . psyllium  1 packet Oral Daily  . zolpidem  5 mg Oral QHS   Continuous Infusions: . sodium chloride 75 mL/hr at 03/20/17 1115  . piperacillin-tazobactam (ZOSYN)  IV Stopped (03/20/17 0946)  . vancomycin 750 mg (03/20/17 0546)     LOS: 1 day    Time spent: 62min    Domenic Polite, MD [facility] Pager [phone]  If 7PM-7AM, please contact night-coverage [email or web address] Password [email or web address] 03/20/2017, 2:17 PM

## 2017-03-20 NOTE — Consult Note (Signed)
[facility] Consult Note  Taylor Kramer 1932/01/22  [id number].    Requesting MD: Broadus John Chief Complaint/Reason for Consult: Decubitus ulcer HPI:  Patient is a 83 y.o. Female who is wheelchair bound and lives in an assisted living facility. Found to have fever and felt weak and clammy yesterday. Brought to [facility] and admitted to medicine service. Code sepsis called and patient was treated with 1750 cc of NS and vanc/zosyn. Denied cough, SOB, chest pain, palpitations, abdominal pain, nausea, vomiting, urinary sxs. Patient has a chronic ulcer of her left buttock that has been there for about a year. Does not note worsening of pain in left buttock, and reports that she tries to stay off that side.   PMH significant for HTN and CREST syndrome.   ROS: Review of Systems  Constitutional: Positive for chills, fever and malaise/fatigue. Negative for weight loss.  Respiratory: Negative for cough and shortness of breath.   Cardiovascular: Negative for chest pain and palpitations.  Gastrointestinal: Negative for abdominal pain, nausea and vomiting.  Genitourinary: Negative for dysuria and urgency.  Skin:       Chronic ulcer of left buttock  Neurological: Positive for weakness. Negative for dizziness, loss of consciousness and headaches.  All other systems reviewed and are negative.   Family History  Problem Relation Age of Onset  . Neuropathy Brother     Past Medical History:  Diagnosis Date  . CREST syndrome ([place])   . Hypertension   . Insomnia   . Neuropathy   . Osteoporosis     Past Surgical History:  Procedure Laterality Date  . ABDOMINAL HYSTERECTOMY    . CHOLECYSTECTOMY    . renal stones      Social History:  reports that she has quit smoking. She has never used smokeless tobacco. She reports that she drinks alcohol. She reports that she does not use drugs.  Allergies: No Known Allergies  Medications Prior to Admission  Medication Sig Dispense Refill  .  acetaminophen (TYLENOL) 500 MG tablet Take 500 mg by mouth every 6 (six) hours as needed for moderate pain.    Marland Kitchen alendronate (FOSAMAX) 70 MG tablet Take 70 mg by mouth once a week. Take with a full glass of water on an empty stomach on Fridays.    Marland Kitchen amLODipine (NORVASC) 2.5 MG tablet Take 2.5 mg by mouth daily.    . bisacodyl (DULCOLAX) 5 MG EC tablet Take 10 mg by mouth daily as needed for mild constipation or moderate constipation.    . cholecalciferol (VITAMIN D) 1000 UNITS tablet Take 1,000 Units by mouth daily.    . Cranberry 450 MG CAPS Take 1 capsule by mouth 2 (two) times daily.    . fluticasone (FLONASE) 50 MCG/ACT nasal spray Place 2 sprays into both nostrils daily.    Marland Kitchen loratadine (CLARITIN) 10 MG tablet Take 10 mg by mouth daily as needed for allergies.     . polyethylene glycol (MIRALAX / GLYCOLAX) packet Take 17 g by mouth daily as needed for mild constipation.    Marland Kitchen Propylene Glycol (SYSTANE BALANCE) 0.6 % SOLN Apply 1 drop to eye 4 (four) times daily.    . psyllium (KONSYL) 0.52 G capsule Take 0.52 g by mouth daily.    Marland Kitchen zolpidem (AMBIEN) 10 MG tablet Take 10 mg by mouth at bedtime.     Marland Kitchen amoxicillin (AMOXIL) 500 MG capsule Take 1 capsule (500 mg total) by mouth every 8 (eight) hours. (Patient not taking: Reported on 03/19/2017) 15 capsule  0    Blood pressure (!) 153/75, pulse 94, temperature 98.6 F (37 C), temperature source Oral, resp. rate 16, height 5' 7"  (1.702 m), weight 44.9 kg (99 lb), SpO2 96 %. Physical Exam: Physical Exam  Constitutional: She is oriented to person, place, and time. She appears well-developed and well-nourished. She is cooperative.  Non-toxic appearance. No distress.  HENT:  Head: Normocephalic and atraumatic.  Right Ear: External ear normal.  Left Ear: External ear normal.  Nose: Nose normal.  Mouth/Throat: Oropharynx is clear and moist and mucous membranes are normal. Abnormal dentition (many metal fillings).  Eyes: Pupils are equal, round, and  reactive to light. Conjunctivae and lids are normal. No scleral icterus.  Neck: Normal range of motion. Neck supple. No thyromegaly present.  Cardiovascular: Normal rate and regular rhythm.   Pulses:      Radial pulses are 2+ on the right side, and 2+ on the left side.       Dorsalis pedis pulses are 2+ on the right side, and 2+ on the left side.  Pulmonary/Chest: Effort normal and breath sounds normal.  Abdominal: Soft. Normal appearance and bowel sounds are normal. She exhibits no distension and no mass. There is no hepatosplenomegaly. There is no tenderness. There is no rigidity and no guarding. No hernia.  Neurological: She is alert and oriented to person, place, and time. A sensory deficit (CREST syndrome) is present.  Skin: Skin is warm and dry. She is not diaphoretic. No pallor.  Ulceration of left buttock about the size of a quarter that is 1.5 cm in depth, surrounding erythema without induration. Skin is necrotic with slough and moderate drainage. (see below)  Psychiatric: She has a normal mood and affect. Her behavior is normal. Judgment normal.         Results for orders placed or performed during the hospital encounter of 03/19/17 (from the past 48 hour(s))  Comprehensive metabolic panel     Status: Abnormal   Collection Time: 03/19/17  4:24 AM  Result Value Ref Range   Sodium 139 135 - 145 mmol/L   Potassium 4.2 3.5 - 5.1 mmol/L   Chloride 102 101 - 111 mmol/L   CO2 30 22 - 32 mmol/L   Glucose, Bld 92 65 - 99 mg/dL   BUN 24 (H) 6 - 20 mg/dL   Creatinine, Ser 0.82 0.44 - 1.00 mg/dL   Calcium 8.3 (L) 8.9 - 10.3 mg/dL   Total Protein 6.3 (L) 6.5 - 8.1 g/dL   Albumin 3.0 (L) 3.5 - 5.0 g/dL   AST 22 15 - 41 U/L   ALT 10 (L) 14 - 54 U/L   Alkaline Phosphatase 48 38 - 126 U/L   Total Bilirubin 1.2 0.3 - 1.2 mg/dL   GFR calc non Af Amer >60 >60 mL/min   GFR calc Af Amer >60 >60 mL/min    Comment: (NOTE) The eGFR has been calculated using the CKD EPI equation. This  calculation has not been validated in all clinical situations. eGFR's persistently <60 mL/min signify possible Chronic Kidney Disease.    Anion gap 7 5 - 15  CBC WITH DIFFERENTIAL     Status: Abnormal   Collection Time: 03/19/17  4:24 AM  Result Value Ref Range   WBC 15.8 (H) 4.0 - 10.5 K/uL   RBC 4.01 3.87 - 5.11 MIL/uL   Hemoglobin 10.8 (L) 12.0 - 15.0 g/dL   HCT 33.9 (L) 36.0 - 46.0 %   MCV 84.5 78.0 - 100.0 fL  MCH 26.9 26.0 - 34.0 pg   MCHC 31.9 30.0 - 36.0 g/dL   RDW 14.5 11.5 - 15.5 %   Platelets 355 150 - 400 K/uL   Neutrophils Relative % 83 %   Neutro Abs 13.2 (H) 1.7 - 7.7 K/uL   Lymphocytes Relative 9 %   Lymphs Abs 1.4 0.7 - 4.0 K/uL   Monocytes Relative 8 %   Monocytes Absolute 1.2 (H) 0.1 - 1.0 K/uL   Eosinophils Relative 0 %   Eosinophils Absolute 0.0 0.0 - 0.7 K/uL   Basophils Relative 0 %   Basophils Absolute 0.0 0.0 - 0.1 K/uL  Blood Culture (routine x 2)     Status: None (Preliminary result)   Collection Time: 03/19/17  4:24 AM  Result Value Ref Range   Specimen Description BLOOD RIGHT ANTECUBITAL    Special Requests      BOTTLES DRAWN AEROBIC AND ANAEROBIC Blood Culture adequate volume   Culture  Setup Time      GRAM POSITIVE COCCI IN CLUSTERS IN BOTH AEROBIC AND ANAEROBIC BOTTLES CRITICAL RESULT CALLED TO, READ BACK BY AND VERIFIED WITH: Madilyn Hook 03/20/17 @ [employer] Addison Lank Performed at [facility], [address], [place], [place] [address]    Culture GRAM POSITIVE COCCI    Report Status PENDING   Blood Culture (routine x 2)     Status: None (Preliminary result)   Collection Time: 03/19/17  4:24 AM  Result Value Ref Range   Specimen Description BLOOD RIGHT WRIST    Special Requests      BOTTLES DRAWN AEROBIC AND ANAEROBIC Blood Culture results may not be optimal due to an inadequate volume of blood received in culture bottles   Culture      NO GROWTH 1 DAY Performed at [facility], [place] [address], [place], [place] [address]    Report  Status PENDING   Urinalysis, Routine w reflex microscopic     Status: Abnormal   Collection Time: 03/19/17  4:24 AM  Result Value Ref Range   Color, Urine YELLOW YELLOW   APPearance CLEAR CLEAR   Specific Gravity, Urine 1.013 1.005 - 1.030   pH 6.0 5.0 - 8.0   Glucose, UA NEGATIVE NEGATIVE mg/dL   Hgb urine dipstick MODERATE (A) NEGATIVE   Bilirubin Urine NEGATIVE NEGATIVE   Ketones, ur NEGATIVE NEGATIVE mg/dL   Protein, ur NEGATIVE NEGATIVE mg/dL   Nitrite NEGATIVE NEGATIVE   Leukocytes, UA SMALL (A) NEGATIVE   RBC / HPF 0-5 0 - 5 RBC/hpf   WBC, UA 6-30 0 - 5 WBC/hpf   Bacteria, UA RARE (A) NONE SEEN   Squamous Epithelial / LPF 0-5 (A) NONE SEEN   Mucus PRESENT    Hyaline Casts, UA PRESENT   Urine culture     Status: Abnormal   Collection Time: 03/19/17  4:24 AM  Result Value Ref Range   Specimen Description URINE, CATHETERIZED    Special Requests NONE    Culture (A)     <10,000 COLONIES/mL INSIGNIFICANT GROWTH Performed at [facility], [address] [place], [place] [address]    Report Status 03/20/2017 FINAL   Blood Culture ID Panel (Reflexed)     Status: Abnormal   Collection Time: 03/19/17  4:24 AM  Result Value Ref Range   Enterococcus species NOT DETECTED NOT DETECTED   Vancomycin resistance NOT DETECTED NOT DETECTED   Listeria monocytogenes NOT DETECTED NOT DETECTED   Staphylococcus species DETECTED (A) NOT DETECTED  Comment: CRITICAL RESULT CALLED TO, READ BACK BY AND VERIFIED WITH: M RELZ 03/20/17 @ 4128 M VESTAL    Staphylococcus aureus NOT DETECTED NOT DETECTED   Methicillin resistance DETECTED (A) NOT DETECTED    Comment: CRITICAL RESULT CALLED TO, READ BACK BY AND VERIFIED WITH: M RELZ 03/20/17 @ 7867 M VESTAL    Streptococcus species NOT DETECTED NOT DETECTED   Streptococcus agalactiae NOT DETECTED NOT DETECTED   Streptococcus pneumoniae NOT DETECTED NOT DETECTED   Streptococcus pyogenes NOT DETECTED NOT DETECTED   Acinetobacter baumannii NOT  DETECTED NOT DETECTED   Enterobacteriaceae species NOT DETECTED NOT DETECTED   Enterobacter cloacae complex NOT DETECTED NOT DETECTED   Escherichia coli NOT DETECTED NOT DETECTED   Klebsiella oxytoca NOT DETECTED NOT DETECTED   Klebsiella pneumoniae NOT DETECTED NOT DETECTED   Proteus species NOT DETECTED NOT DETECTED   Serratia marcescens NOT DETECTED NOT DETECTED   Carbapenem resistance NOT DETECTED NOT DETECTED   Haemophilus influenzae NOT DETECTED NOT DETECTED   Neisseria meningitidis NOT DETECTED NOT DETECTED   Pseudomonas aeruginosa NOT DETECTED NOT DETECTED   Candida albicans NOT DETECTED NOT DETECTED   Candida glabrata NOT DETECTED NOT DETECTED   Candida krusei NOT DETECTED NOT DETECTED   Candida parapsilosis NOT DETECTED NOT DETECTED   Candida tropicalis NOT DETECTED NOT DETECTED    Comment: Performed at [facility] [address] [place], [place] [address]  I-Stat CG4 Lactic Acid, ED  (not at  [facility])     Status: None   Collection Time: 03/19/17  4:43 AM  Result Value Ref Range   Lactic Acid, Venous 1.18 0.5 - 1.9 mmol/L  CBC     Status: Abnormal   Collection Time: 03/19/17 12:50 PM  Result Value Ref Range   WBC 11.6 (H) 4.0 - 10.5 K/uL   RBC 4.06 3.87 - 5.11 MIL/uL   Hemoglobin 10.9 (L) 12.0 - 15.0 g/dL   HCT 34.0 (L) 36.0 - 46.0 %   MCV 83.7 78.0 - 100.0 fL   MCH 26.8 26.0 - 34.0 pg   MCHC 32.1 30.0 - 36.0 g/dL   RDW 14.5 11.5 - 15.5 %   Platelets 306 150 - 400 K/uL  Creatinine, serum     Status: None   Collection Time: 03/19/17 12:50 PM  Result Value Ref Range   Creatinine, Ser 0.69 0.44 - 1.00 mg/dL   GFR calc non Af Amer >60 >60 mL/min   GFR calc Af Amer >60 >60 mL/min    Comment: (NOTE) The eGFR has been calculated using the CKD EPI equation. This calculation has not been validated in all clinical situations. eGFR's persistently <60 mL/min signify possible Chronic Kidney Disease.   Basic metabolic panel     Status: Abnormal   Collection Time:  03/20/17  9:09 AM  Result Value Ref Range   Sodium 139 135 - 145 mmol/L   Potassium 3.6 3.5 - 5.1 mmol/L   Chloride 105 101 - 111 mmol/L   CO2 30 22 - 32 mmol/L   Glucose, Bld 113 (H) 65 - 99 mg/dL   BUN 12 6 - 20 mg/dL   Creatinine, Ser 0.69 0.44 - 1.00 mg/dL   Calcium 8.2 (L) 8.9 - 10.3 mg/dL   GFR calc non Af Amer >60 >60 mL/min   GFR calc Af Amer >60 >60 mL/min    Comment: (NOTE) The eGFR has been calculated using the CKD EPI equation. This calculation has not been validated in all clinical situations. eGFR's  persistently <60 mL/min signify possible Chronic Kidney Disease.    Anion gap 4 (L) 5 - 15  CBC     Status: Abnormal   Collection Time: 03/20/17  9:09 AM  Result Value Ref Range   WBC 8.7 4.0 - 10.5 K/uL   RBC 3.97 3.87 - 5.11 MIL/uL   Hemoglobin 10.8 (L) 12.0 - 15.0 g/dL   HCT 33.6 (L) 36.0 - 46.0 %   MCV 84.6 78.0 - 100.0 fL   MCH 27.2 26.0 - 34.0 pg   MCHC 32.1 30.0 - 36.0 g/dL   RDW 14.5 11.5 - 15.5 %   Platelets 282 150 - 400 K/uL  Protime-INR     Status: None   Collection Time: 03/20/17  9:09 AM  Result Value Ref Range   Prothrombin Time 12.9 11.4 - 15.2 seconds   INR 0.97    Ct Abdomen Pelvis W Contrast  Result Date: 03/19/2017 CLINICAL DATA:  Worsening decubitus ulcer over the past year. Fever and foul odor. EXAM: CT ABDOMEN AND PELVIS WITH CONTRAST TECHNIQUE: Multidetector CT imaging of the abdomen and pelvis was performed using the standard protocol following bolus administration of intravenous contrast. CONTRAST:  80 cc Isovue-300 IV COMPARISON:  None. FINDINGS: Lower chest: Motion artifact the lung bases. Probable atelectasis no dependent right lower lobe. Hepatobiliary: Postcholecystectomy with clips in the gallbladder fossa. There is intra and extrahepatic biliary ductal dilatation, common bile duct measures approximately 10 mm distally. Probable cysts in the inferior right lobe of the liver. Pancreas: Mild pancreatic ductal dilatation measuring 5 mm.  Parenchymal atrophy. No peripancreatic inflammation. No discrete lesion is seen. Spleen: Normal in size without focal abnormality. Adrenals/Urinary Tract: No adrenal nodule. Staghorn 3.1 cm calculus in the left renal pelvis with chronic hydronephrosis and marked renal parenchymal atrophy. Additional nonobstructing stones scattered throughout the left kidney. Dilatation of right renal pelvis with transition at the ureteropelvic junction. No right perinephric edema. Simple cyst in the lower right kidney. Urinary bladder is displaced anteriorly due to large stool ball in the rectum. Stomach/Bowel: Lack of enteric contrast and paucity intra-abdominal fat limits assessment. No bowel dilatation to suggest obstruction. Colonic tortuosity with moderate colonic stool burden. Large stool ball distends the distal sigmoid colon and rectum spinning 10.3 cm. Mild associated ictal wall thickening. The appendix is tentatively identified and normal. Vascular/Lymphatic: Aortic atherosclerosis without aneurysm. Limited assessment for adenopathy given paucity of intra-abdominal fat and lack of enteric contrast, no bulky adenopathy is seen. Reproductive: Status post hysterectomy. No adnexal masses. Other: No ascites or free air. Musculoskeletal: Skin thickening with associated defect/ ulcer posterior to the left aspect of the sacrum. Surrounding edematous changes with no discrete fluid collection. No periosteal reaction or bony destructive change of the underlying sacrum to suggest osteomyelitis. There is degenerative change in the spine. Remote posterior left twelfth rib fracture. IMPRESSION: 1. Skin thickening and subcutaneous edema with soft tissue ulcer overlying the left sacrum. No abscess or CT findings of osteomyelitis. 2. Large stool ball distending the rectosigmoid colon with rectal wall thickening, consistent with fecal impaction. 3. Staghorn calculus in the left renal pelvis with chronic hydronephrosis and renal parenchymal  atrophy. 4. Postcholecystectomy with intra and extrahepatic biliary ductal dilatation. Recommend correlation with LFTs, if elevated consider further evaluation with MRCP, ERCP or EUS. Additionally there is mild pancreatic ductal dilatation. Pancreatic head mass not excluded, but not seen on CT. 5. Aortic atherosclerosis. Electronically Signed   By: Jeb Levering M.D.   On: 03/19/2017 06:39   Dg  Chest Port 1 View  Result Date: 03/19/2017 CLINICAL DATA:  New onset fever.  Cough. EXAM: PORTABLE CHEST 1 VIEW COMPARISON:  Chest radiograph 11/21/2014 FINDINGS: Left basilar atelectasis. No focal consolidation. Unchanged appearance of the cardio mediastinum. No pleural effusion or pneumothorax. IMPRESSION: Left basilar atelectasis. Electronically Signed   By: Ulyses Jarred M.D.   On: 03/19/2017 04:51    Assessment/Plan Decubitus ulcer - WBC down to 8.7 from 15 yesterday, currently afebrile - CT: Skin thickening and subcutaneous edema with soft tissue ulcer overlying the left sacrum. No abscess or CT findings of osteomyelitis - chronic wound with necrotic looking tissue - plan for beside debridement - saline WTD dressings TID after debridement and try to keep pressure off area - wound cultures ordered Sepsis - physiology improving; blood cx positive for methicillin resistant staph Fecal impaction - ok to use enema from our standpoint, just make sure dressing is kept clean HTN Hx of CREST syndrome Wheelchair bound  FEN - HH diet VTE - SCDs, lovenox ID - IV zosyn and vanc (8/22>>), blood cx positive for methacillin resistant staph  Plan: Bedside debridement.    Saline WTD dressings TID. Cxs pending.   Brigid Re, [facility] Surgery 03/20/2017, 10:53 AM Pager: [phone] Consults: [phone]  Agree with above. Claiborne Billings will debride at bedside.  Alphonsa Overall, MD, [facility] Surgery Pager: [phone] Office phone:  [phone]

## 2017-03-20 NOTE — Procedures (Signed)
Patient: Taylor Kramer [birth year]  MRN: [id number]  Date of procedure: 03/20/17   Preoperative Dx: unstageable pressure ulcer of left buttock Postoperative Dx: Stage III pressure ulcer of left buttock Procedure: Wound debridement Indications: necrotic tissue and unhealing wound Anesthesia: none  Details: Area was cleaned with betadine swabs, patient tested with scalpel tip for sensation. Area found to be insensate. Necrotic tissue debrided from wound using forceps and scalpel. Wound probed and no tracts were found. Wound now 3 cm x 3cm x 1.5 cm approximately. Wound cleansed with sterile saline and packed with sterile saline 4x4 wet to dry dressing. Covered with foam dressing for pressure relief.   EBL: 15 cc Complications: none  Brigid Re , [facility] Surgery 03/20/2017, 3:14 PM Pager: [phone]  Agree with above.  Alphonsa Overall, MD, [facility] Surgery Pager: [phone] Office phone:  [phone]

## 2017-03-20 NOTE — Clinical Social Work Note (Signed)
Clinical Social Work Assessment  Patient Details  Name: Taylor Kramer MRN: [id number] Date of Birth: March 11, 1932  Date of referral:  03/20/17               Reason for consult:  Facility Placement                Permission sought to share information with:  Facility Art therapist granted to share information::  Yes, Verbal Permission Granted  Name::        Agency::     Relationship::     Contact Information:     Housing/Transportation Living arrangements for the past 2 months:  [place] of Information:  Patient Patient Interpreter Needed:  None Criminal Activity/Legal Involvement Pertinent to Current Situation/Hospitalization:    Significant Relationships:  Adult Children Lives with:    Do you feel safe going back to the place where you live?  Yes Need for family participation in patient care:  No (Coment)  Care giving concerns:  None listed by pt/family   Social Worker assessment / plan:  CSW met with pt and confirmed pt's plan to be discharged to back to [facility]F to live at discharge.  CSW provided active listening and validated pt's concerns.   CSW Dept WAS NOT given permission to complete FL-2 and send referrals out to SNF facilities via the hub per pt's request.  Pt has been living at [facility] for approx 5 years, prior to being admitted to [facility].  Employment status:  Retired Advertising copywriter, [employer] PT Recommendations:  Not assessed at this time Information / Referral to community resources:     Patient/Family's Response to care:  Patient alert and oriented.  Patient agreeable to plan.  Pt's sons supportive and strongly involved in pt.'s care, per pot.  Pt  pleasant and appreciated CSW intervention.     Patient/Family's Understanding of and Emotional Response to Diagnosis, Current Treatment, and Prognosis:  Still assessing   Emotional Assessment Appearance:  Appears stated  age Attitude/Demeanor/Rapport:    Affect (typically observed):  Accepting, Adaptable, Calm Orientation:  Oriented to Self, Oriented to Place, Oriented to  Time, Oriented to Situation Alcohol / Substance use:    Psych involvement (Current and /or in the community):     Discharge Needs  Concerns to be addressed:  No discharge needs identified Readmission within the last 30 days:  No Current discharge risk:  None Barriers to Discharge:  No Barriers Identified   Claudine Mouton, LCSWA 03/20/2017, 10:34 PM

## 2017-03-20 NOTE — Progress Notes (Signed)
PHARMACY - PHYSICIAN COMMUNICATION CRITICAL VALUE ALERT - BLOOD CULTURE IDENTIFICATION (BCID)  Results for orders placed or performed during the hospital encounter of 03/19/17  Blood Culture ID Panel (Reflexed) (Collected: 03/19/2017  4:24 AM)  Result Value Ref Range   Enterococcus species NOT DETECTED NOT DETECTED   Vancomycin resistance NOT DETECTED NOT DETECTED   Listeria monocytogenes NOT DETECTED NOT DETECTED   Staphylococcus species DETECTED (A) NOT DETECTED   Staphylococcus aureus NOT DETECTED NOT DETECTED   Methicillin resistance DETECTED (A) NOT DETECTED   Streptococcus species NOT DETECTED NOT DETECTED   Streptococcus agalactiae NOT DETECTED NOT DETECTED   Streptococcus pneumoniae NOT DETECTED NOT DETECTED   Streptococcus pyogenes NOT DETECTED NOT DETECTED   Acinetobacter baumannii NOT DETECTED NOT DETECTED   Enterobacteriaceae species NOT DETECTED NOT DETECTED   Enterobacter cloacae complex NOT DETECTED NOT DETECTED   Escherichia coli NOT DETECTED NOT DETECTED   Klebsiella oxytoca NOT DETECTED NOT DETECTED   Klebsiella pneumoniae NOT DETECTED NOT DETECTED   Proteus species NOT DETECTED NOT DETECTED   Serratia marcescens NOT DETECTED NOT DETECTED   Carbapenem resistance NOT DETECTED NOT DETECTED   Haemophilus influenzae NOT DETECTED NOT DETECTED   Neisseria meningitidis NOT DETECTED NOT DETECTED   Pseudomonas aeruginosa NOT DETECTED NOT DETECTED   Candida albicans NOT DETECTED NOT DETECTED   Candida glabrata NOT DETECTED NOT DETECTED   Candida krusei NOT DETECTED NOT DETECTED   Candida parapsilosis NOT DETECTED NOT DETECTED   Candida tropicalis NOT DETECTED NOT DETECTED    Name of physician (or Provider) Contacted: Dr. Broadus John  Changes to prescribed antibiotics required: none needed, pt on vanc/zosyn Eudelia Bunch, Pharm.D. [phone] 03/20/2017 7:41 AM

## 2017-03-21 DIAGNOSIS — L03317 Cellulitis of buttock: Secondary | ICD-10-CM

## 2017-03-21 LAB — CBC
HCT: 33.4 % — ABNORMAL LOW (ref 36.0–46.0)
Hemoglobin: 10.7 g/dL — ABNORMAL LOW (ref 12.0–15.0)
MCH: 26.6 pg (ref 26.0–34.0)
MCHC: 32 g/dL (ref 30.0–36.0)
MCV: 82.9 fL (ref 78.0–100.0)
PLATELETS: 295 10*3/uL (ref 150–400)
RBC: 4.03 MIL/uL (ref 3.87–5.11)
RDW: 14.2 % (ref 11.5–15.5)
WBC: 7 10*3/uL (ref 4.0–10.5)

## 2017-03-21 LAB — BASIC METABOLIC PANEL
Anion gap: 6 (ref 5–15)
BUN: 11 mg/dL (ref 6–20)
CHLORIDE: 104 mmol/L (ref 101–111)
CO2: 29 mmol/L (ref 22–32)
CREATININE: 0.67 mg/dL (ref 0.44–1.00)
Calcium: 8.2 mg/dL — ABNORMAL LOW (ref 8.9–10.3)
GFR calc non Af Amer: >60 mL/min (ref >60)
Glucose, Bld: 115 mg/dL — ABNORMAL HIGH (ref 65–99)
POTASSIUM: 3.2 mmol/L — AB (ref 3.5–5.1)
SODIUM: 139 mmol/L (ref 135–145)

## 2017-03-21 NOTE — Evaluation (Signed)
Physical Therapy Evaluation Patient Details Name: Taylor Kramer MRN: [id number] DOB: 12-Nov-1931 Today's Date: 03/21/2017   History of Present Illness  83 y.o. female with past medical history significant for CR EST syndrome and hypertension who was in her usual state of health until yesterday when she felt a little weak and had a mechanical fall without injury.  Dx of fecal impaction, decubitus ulcer of back.   Clinical Impression  Pt admitted with above diagnosis. Pt currently with functional limitations due to the deficits listed below (see PT Problem List). Mod assist for balance while taking several pivotal steps with RW for bed to recliner transfer. Pt reports her balance is poor at baseline, but that it's currently worse than normal. She reports h/o multiple falls.  Pt will benefit from skilled PT to increase their independence and safety with mobility to allow discharge to the venue listed below.       Follow Up Recommendations Home health PT;Supervision for mobility/OOB    Equipment Recommendations  None recommended by PT    Recommendations for Other Services       Precautions / Restrictions Precautions Precautions: Fall Precaution Comments: reports h/o multiple falls Restrictions Weight Bearing Restrictions: No      Mobility  Bed Mobility Overal bed mobility: Needs Assistance Bed Mobility: Supine to Sit     Supine to sit: Mod assist     General bed mobility comments: mod A to raise trunk, min A to scoot hips to EOB  Transfers Overall transfer level: Needs assistance Equipment used: Rolling walker (2 wheeled) Transfers: Sit to/from [facility] Sit to Stand: Min assist Stand pivot transfers: Mod assist       General transfer comment: min A to rise, mod A for balance with SPT, pt took several pivotal steps with RW with somewhat ataxic movement of BLEs.   Ambulation/Gait                Stairs            Wheelchair Mobility     Modified Rankin (Stroke Patients Only)       Balance Overall balance assessment: Needs assistance   Sitting balance-Leahy Scale: Fair       Standing balance-Leahy Scale: Poor                               Pertinent Vitals/Pain Pain Assessment: 0-10 Pain Score: 6  Pain Location: wound on back Pain Descriptors / Indicators: Sore Pain Intervention(s): Limited activity within patient's tolerance;Monitored during session;Premedicated before session    Home Living Family/patient expects to be discharged to:: Assisted living               Home Equipment: Walker - 2 wheels;Wheelchair - manual      Prior Function Level of Independence: Needs assistance   Gait / Transfers Assistance Needed: uses RW in room, WC to get to/from dining room  ADL's / Homemaking Assistance Needed: assist for bathing, pt dresses herself        Hand Dominance        Extremity/Trunk Assessment        Lower Extremity Assessment Lower Extremity Assessment: Generalized weakness (B knee ext -15* AROM, knee ext +4/5, decr sensation to light touch B feet)    Cervical / Trunk Assessment Cervical / Trunk Assessment: Kyphotic  Communication   Communication: Expressive difficulties  Cognition Arousal/Alertness: Awake/alert Behavior During Therapy: WFL for tasks assessed/performed Overall Cognitive  Status: Within Functional Limits for tasks assessed                                        General Comments      Exercises     Assessment/Plan    PT Assessment Patient needs continued PT services  PT Problem List Decreased activity tolerance;Pain;Decreased balance;Decreased mobility       PT Treatment Interventions Gait training;Functional mobility training;Balance training;Therapeutic exercise;Patient/family education    PT Goals (Current goals can be found in the Care Plan section)  Acute Rehab PT Goals Patient Stated Goal: loves to read; return to  prior level of function of walking in her apartment independently PT Goal Formulation: With patient Time For Goal Achievement: 04/04/17 Potential to Achieve Goals: Good    Frequency Min 3X/week   Barriers to discharge        Co-evaluation               AM-PAC PT "6 Clicks" Daily Activity  Outcome Measure Difficulty turning over in bed (including adjusting bedclothes, sheets and blankets)?: A Lot Difficulty moving from lying on back to sitting on the side of the bed? : Unable Difficulty sitting down on and standing up from a chair with arms (e.g., wheelchair, bedside commode, etc,.)?: Unable Help needed moving to and from a bed to chair (including a wheelchair)?: A Lot Help needed walking in hospital room?: A Lot Help needed climbing 3-5 steps with a railing? : Total 6 Click Score: 9    End of Session Equipment Utilized During Treatment: Gait belt Activity Tolerance: Patient tolerated treatment well Patient left: in chair;with call bell/phone within reach Nurse Communication: Mobility status (bed/pt saturated in urine, needs cleanup) PT Visit Diagnosis: Difficulty in walking, not elsewhere classified (R26.2);Repeated falls (R29.6);Other abnormalities of gait and mobility (R26.89)    Time: 5009-3818 PT Time Calculation (min) (ACUTE ONLY): 18 min   Charges:   PT Evaluation $PT Eval Moderate Complexity: 1 Mod     PT G Codes:         Philomena Doheny 03/21/2017, 1:56 PM [phone]

## 2017-03-21 NOTE — Progress Notes (Signed)
[facility] Progress Note     Subjective: CC: pain in left buttock Patient states she is having pain and soreness in the left buttock. Denies nausea, vomiting, abdominal pain, urinary sxs. She has tried to stay off of left side.   Objective: Vital signs in last 24 hours: Temp:  [97 F (36.1 C)-100 F (37.8 C)] 97 F (36.1 C) (08/24 0543) Pulse Rate:  [74-87] 74 (08/24 0543) Resp:  [17-18] 17 (08/24 0543) BP: (150-168)/(80-81) 168/81 (08/24 0543) SpO2:  [97 %-98 %] 97 % (08/24 0543)    Intake/Output from previous day: 08/23 0701 - 08/24 0700 In: 2296.3 [P.O.:360; I.V.:1386.3; IV Piggyback:550] Out: 3400 [Urine:3400] Intake/Output this shift: Total I/O In: 480 [P.O.:480] Out: -   PE: Gen:  Alert, NAD, pleasant Card:  Regular rate and rhythm, pedal pulses 2+ BL Pulm:  Normal effort, clear to auscultation bilaterally Abd: Soft, non-tender, non-distended, bowel sounds present Skin: left buttock wound with slough and bloody drainage no purulence noted; surrounding erythema unchanged, no induration.  Psych: A&Ox3   Lab Results:   Recent Labs  03/20/17 0909 03/21/17 0626  WBC 8.7 7.0  HGB 10.8* 10.7*  HCT 33.6* 33.4*  PLT 282 295   BMET  Recent Labs  03/20/17 0909 03/21/17 0626  NA 139 139  K 3.6 3.2*  CL 105 104  CO2 30 29  GLUCOSE 113* 115*  BUN 12 11  CREATININE 0.69 0.67  CALCIUM 8.2* 8.2*   PT/INR  Recent Labs  03/20/17 0909  LABPROT 12.9  INR 0.97   CMP     Component Value Date/Time   NA 139 03/21/2017 0626   K 3.2 (L) 03/21/2017 0626   CL 104 03/21/2017 0626   CO2 29 03/21/2017 0626   GLUCOSE 115 (H) 03/21/2017 0626   BUN 11 03/21/2017 0626   CREATININE 0.67 03/21/2017 0626   CALCIUM 8.2 (L) 03/21/2017 0626   PROT 6.3 (L) 03/19/2017 0424   ALBUMIN 3.0 (L) 03/19/2017 0424   AST 22 03/19/2017 0424   ALT 10 (L) 03/19/2017 0424   ALKPHOS 48 03/19/2017 0424   BILITOT 1.2 03/19/2017 0424   GFRNONAA >60 03/21/2017 0626   GFRAA >60 03/21/2017 1448    Anti-infectives: Anti-infectives    Start     Dose/Rate Route Frequency Ordered Stop   03/20/17 0600  vancomycin (VANCOCIN) IVPB 750 mg/150 ml premix     750 mg 150 mL/hr over 60 Minutes Intravenous Every 24 hours 03/19/17 1105     03/19/17 1400  piperacillin-tazobactam (ZOSYN) IVPB 3.375 g     3.375 g 12.5 mL/hr over 240 Minutes Intravenous Every 8 hours 03/19/17 1105     03/19/17 1200  piperacillin-tazobactam (ZOSYN) IVPB 3.375 g  Status:  Discontinued     3.375 g 100 mL/hr over 30 Minutes Intravenous Every 6 hours 03/19/17 1002 03/19/17 1105   03/19/17 0415  piperacillin-tazobactam (ZOSYN) IVPB 3.375 g     3.375 g 100 mL/hr over 30 Minutes Intravenous  Once 03/19/17 0403 03/19/17 0527   03/19/17 0415  vancomycin (VANCOCIN) IVPB 1000 mg/200 mL premix     1,000 mg 200 mL/hr over 60 Minutes Intravenous  Once 03/19/17 0403 03/19/17 0702       Assessment/Plan Decubitus ulcer S/P excisional debridement of skin and subQ at the bedside 8/23 - WBC down to 7.0, Tmax in last 24 hrs 100 - CT: Skin thickening and subcutaneous edema with soft tissue ulcer overlying the left sacrum. No abscess or CT findings of osteomyelitis -  chronic wound with necrotic looking tissue - plan for beside debridement - saline WTD dressings TID after debridement and try to keep pressure off area - wound cultures pending Sepsis - physiology improving; blood cx positive for methicillin resistant staph Fecal impaction - ok to use enema from our standpoint, just make sure dressing is kept clean HTN Hx of CREST syndrome Wheelchair bound  FEN - HH diet VTE - SCDs, lovenox ID - IV zosyn and vanc (8/22>>), blood cx positive for methacillin resistant staph  Plan: Continue TID dressing changes, cx pending.   Try to keep off left buttock.   LOS: 2 days    Brigid Re , [facility] 03/21/2017, 2:18 PM Pager: [phone] Consults: [phone]  Agree with  above. Wound better. Continue local wound care.  Alphonsa Overall, MD, [facility] Pager: [phone] Office phone:  [phone] '

## 2017-03-21 NOTE — Evaluation (Addendum)
Occupational Therapy Evaluation Patient Details Name: Taylor Kramer MRN: [id number] DOB: 08-11-1931 Today's Date: 03/21/2017    History of Present Illness 83 y.o. female with past medical history significant for CREST syndrome and hypertension who was in her usual state of health until yesterday when she felt a little weak and had a mechanical fall without injury.  Dx of fecal impaction, decubitus ulcer of back.    Clinical Impression   Pt was admitted from ALF with the above.  At baseline, she is mod I for some adls and has assistance with others. She is not at baseline currently and states that she can get more assistance from staff, but it takes a little while. She will benefit from ongoing OT to return to PLOF.  Acute goals are for min guard level focusing on transfers from w/c to increase safety and independence    Follow Up Recommendations  Home health OT (ALF with assistance for mobility and adls)    Equipment Recommendations  None recommended by OT    Recommendations for Other Services       Precautions / Restrictions Precautions Precautions: Fall Precaution Comments: reports h/o multiple falls Restrictions Weight Bearing Restrictions: No      Mobility Bed Mobility      rolling supervision with rails  Mod A to EOB with PT earlier        Transfers BY PT Overall transfer level: Needs assistance Equipment used: Rolling walker (2 wheeled) Transfers: Sit to/from Stand Sit to Stand: Min assist            Balance Overall balance assessment: Needs assistance   Sitting balance-Leahy Scale: Fair       Standing balance-Leahy Scale: Poor                             ADL either performed or assessed with clinical judgement   ADL Overall ADL's : Needs assistance/impaired     Grooming: Set up;Sitting   Upper Body Bathing: Set up;Sitting   Lower Body Bathing: Minimal assistance;Sit to/from stand   Upper Body Dressing : Set up;Sitting    Lower Body Dressing: Minimal assistance;Sit to/from stand                 General ADL Comments: pt is very flexible when reaching feet. Min A for sit to stand for adls based on standing with PT earlier.  Pt agreeable to working with OT at bed level as she was tired after having dressing changed.  At baseline, pt is used to standing to brush teeth with electric toothbrush and stands for hygiene using grab bar     Vision         Perception     Praxis      Pertinent Vitals/Pain Pain Assessment: 0-10 Pain Score: 6  Pain Location: wound on back Pain Descriptors / Indicators: Sore Pain Intervention(s): Limited activity within patient's tolerance;Monitored during session     Hand Dominance     Extremity/Trunk Assessment Upper Extremity Assessment Upper Extremity Assessment: Generalized weakness (pt has numbness but not absent; flexor tendons visible).  Pt wears pull on clothing for ease and watches hands due to decreased sensation      Cervical / Trunk Assessment Cervical / Trunk Assessment: Kyphotic   Communication Communication Communication: Other (comment) (dysarthric)   Cognition Arousal/Alertness: Awake/alert Behavior During Therapy: WFL for tasks assessed/performed Overall Cognitive Status: Within Functional Limits for tasks assessed  General Comments       Exercises     Shoulder Instructions      Home Living Family/patient expects to be discharged to:: Assisted living                             Home Equipment: Walker - 2 wheels;Wheelchair - manual;Walker - 4 wheels;Grab bars - toilet   Additional Comments: has a high commode.  Aides help her shower 2x week      Prior Functioning/Environment Level of Independence: Needs assistance  Gait / Transfers Assistance Needed: uses RW in room, WC to get to/from dining room ADL's / Homemaking Assistance Needed: pt gets herself dressed, uses toilet  herself.  Walks around room and aides get her into w/c and take her to dining room 2x week   Comments: pt reads a lot; doesn't do activities        OT Problem List: Decreased strength;Decreased activity tolerance;Pain;Impaired balance (sitting and/or standing);Impaired sensation      OT Treatment/Interventions: Self-care/ADL training;DME and/or AE instruction;Balance training;Patient/family education;Therapeutic activities    OT Goals(Current goals can be found in the care plan section) Acute Rehab OT Goals Patient Stated Goal: loves to read; return to prior level of function of walking in her apartment independently OT Goal Formulation: With patient Time For Goal Achievement: 04/04/17 Potential to Achieve Goals: Good ADL Goals Pt Will Perform Grooming: with min guard assist;standing Pt Will Transfer to Toilet: (P) with min guard assist;stand pivot transfer;grab bars (high commode from w/c) Additional ADL Goal #1: pt will perform bed mobility at supervision level in preparation for adls Additional ADL Goal #2: Pt will maintain static standing wtih min guard for 2 minutes during adls  OT Frequency: Min 2X/week   Barriers to D/C:            Co-evaluation              AM-PAC PT "6 Clicks" Daily Activity     Outcome Measure Help from another person eating meals?: None Help from another person taking care of personal grooming?: A Little Help from another person toileting, which includes using toliet, bedpan, or urinal?: A Little Help from another person bathing (including washing, rinsing, drying)?: A Little Help from another person to put on and taking off regular upper body clothing?: A Little Help from another person to put on and taking off regular lower body clothing?: A Little 6 Click Score: 19   End of Session    Activity Tolerance: Patient tolerated treatment well Patient left: in bed;with call bell/phone within reach;with bed alarm set  OT Visit Diagnosis:  Muscle weakness (generalized) (M62.81)                Time: 1452-1510 OT Time Calculation (min): 18 min Charges:  OT General Charges $OT Visit: 1 Procedure OT Evaluation $OT Eval Low Complexity: 1 Procedure G-Codes:     Lesle Chris, OTR/L [phone] 03/21/2017  Firmin Belisle 03/21/2017, 3:58 PM

## 2017-03-21 NOTE — Progress Notes (Signed)
Initial Nutrition Assessment  DOCUMENTATION CODES:   Severe malnutrition in context of chronic illness  INTERVENTION:   Magic cup TID with meals, each supplement provides 290 kcal and 9 grams of protein  NUTRITION DIAGNOSIS:   Malnutrition (Severe) related to chronic illness (Osteoporsis chronically wheelchair bound) as evidenced by severe depletion of body fat, severe depletion of muscle mass.  GOAL:   Patient will meet greater than or equal to 90% of their needs  MONITOR:   PO intake, Supplement acceptance, Labs, Weight trends  REASON FOR ASSESSMENT:   Low Braden    ASSESSMENT:   Pt with PMH of HTN, neuropathy, and osteoporosis. Presents this admission with decubitus ulcer with black discoloration concerning for necrosis and fecal impaction. Underwent debridement 8/23.   Spoke with pt at bedside. Pt eating three meals per day a SNF, typically consists of a greek yogurt for breakfast, a lunch, and dinner the facility provides. Asked pt how much of her meals she consumes. States it varies depending on if she likes the options provided or not. Suspect pt does not consume >50 % of her estimated energy requirement. This is significant.   Reports a UBW of 99 lb. Has not lost wt unintentionally. Wt records are limited providing a 125 lb wt in April 2016. Suspect pt has lost significant amount of wt.   Nutrition-Focused physical exam completed. Findings are severe fat depletion, severe muscle depletion, and mild edema left hand from IVF. Pt chronically wheel chair bound which could be a contributing factor for her BLE muscle depletions. Discussed supplementation options this admission for her significant wounds. She does not like Ensure, but is willing to try Magic Cups.   Medications reviewed and include: NaCl 0.45% @ 75 ml/hr, IV abx Labs reviewed: K 3.2 (L) CBG 92-115 Calcium 8.2 (L)  Diet Order:  Diet Heart Room service appropriate? Yes; Fluid consistency: Thin  Skin:   (Stg  IV sacrum, stg III buttocks)  Last BM:  PTA  Height:   Ht Readings from Last 1 Encounters:  03/19/17 5\' 7"  (1.702 m)    Weight:   Wt Readings from Last 1 Encounters:  03/19/17 99 lb (44.9 kg)    Ideal Body Weight:  61.4 kg  BMI:  Body mass index is 15.51 kg/m.  Estimated Nutritional Needs:   Kcal:  1400-1600 (31-36 kcal/kg)  Protein:  80-90 grams (1.8-2 g/kg)  Fluid:  >1.4 L/day  EDUCATION NEEDS:   No education needs identified at this time  [place], LDN Clinical Nutrition Pager # - [phone]

## 2017-03-21 NOTE — Progress Notes (Addendum)
PROGRESS NOTE    Taylor Kramer  FWY:[id number] DOB: 05-Jun-1932 DOA: 03/19/2017 PCP: Leeroy Cha, MD  Brief Narrative: 83 year old female with history of hypertension and crest syndrome, wheelchair bound, from a local assisted living facility was admitted 8/22 with fever or chills weakness and increased pain at the site of her sacral decubitus ulcer that she has had for about a year. Blood cultures positive for staph, CT scan without evidence of deep abscess or osteomyelitis   Assessment & Plan:   Wound infection/secondary infection of sacral decubitus ulcer with eschar/ necrotic appearing tissue -Continue current broad-spectrum antibiotics -Blood culture with 1/2 Coag negative staph c/w contaminant -General surgery consult appreciated, s/p bedside debridement -CT scan without evidence of osteomyelitis or abscess -change to PO Abx tomorrow -ambulate, PT eval    Hypertension -Stable, low-dose amlodipine on hold    History of CREST syndrome -Continue eyedrops, soft diet per home regimen    Fecal impaction ([place]) -Start enema pending surgical evaluation of sacral wound  DVT prophylaxis: Lovenox Code Status: DO NOT RESUSCITATE Family Communication: No family at bedside Disposition Plan: Likely back to assisted living in 1-2days  Consultants:   CCS  Antimicrobials:   Vancomycin and Zosyn   Subjective: Doing ok, some pain at wound site  Objective: Vitals:   03/20/17 0543 03/20/17 1314 03/20/17 2124 03/21/17 0543  BP: (!) 153/75 124/67 (!) 150/80 (!) 168/81  Pulse: 94 90 87 74  Resp:  18 18 17   Temp: 98.6 F (37 C) 98.7 F (37.1 C) 100 F (37.8 C) (!) 97 F (36.1 C)  TempSrc: Oral Oral Oral Oral  SpO2: 96% 95% 98% 97%  Weight:      Height:        Intake/Output Summary (Last 24 hours) at 03/21/17 1416 Last data filed at 03/21/17 1300  Gross per 24 hour  Intake          2536.25 ml  Output             1600 ml  Net           936.25 ml   Filed  Weights   03/19/17 0448  Weight: 44.9 kg (99 lb)    Examination:  Gen: Thin frail Caucasian female, dysarthric HEENT: PERRLA, Neck supple, no JVD Lungs: Good air movement bilaterally, CTAB CVS: RRR,No Gallops,Rubs or new Murmurs Abd:  Abdomen is nondistended, soft and nontender.  Normal bowel sounds heard. Right buttock, with sacral decubitus wound s/p debridement, packed and covered with foam dressing Extremities: No Cyanosis, Clubbing or edema Skin: no new rashes Psychiatry: Judgement and insight appear normal. Mood & affect appropriate.     Data Reviewed:   CBC:  Recent Labs Lab 03/19/17 0424 03/19/17 1250 03/20/17 0909 03/21/17 0626  WBC 15.8* 11.6* 8.7 7.0  NEUTROABS 13.2*  --   --   --   HGB 10.8* 10.9* 10.8* 10.7*  HCT 33.9* 34.0* 33.6* 33.4*  MCV 84.5 83.7 84.6 82.9  PLT 355 306 282 277   Basic Metabolic Panel:  Recent Labs Lab 03/19/17 0424 03/19/17 1250 03/20/17 0909 03/21/17 0626  NA 139  --  139 139  K 4.2  --  3.6 3.2*  CL 102  --  105 104  CO2 30  --  30 29  GLUCOSE 92  --  113* 115*  BUN 24*  --  12 11  CREATININE 0.82 0.69 0.69 0.67  CALCIUM 8.3*  --  8.2* 8.2*   GFR: Estimated Creatinine Clearance: 37.8 mL/min (by  C-G formula based on SCr of 0.67 mg/dL). Liver Function Tests:  Recent Labs Lab 03/19/17 0424  AST 22  ALT 10*  ALKPHOS 48  BILITOT 1.2  PROT 6.3*  ALBUMIN 3.0*   No results for input(s): LIPASE, AMYLASE in the last 168 hours. No results for input(s): AMMONIA in the last 168 hours. Coagulation Profile:  Recent Labs Lab 03/20/17 0909  INR 0.97   Cardiac Enzymes: No results for input(s): CKTOTAL, CKMB, CKMBINDEX, TROPONINI in the last 168 hours. BNP (last 3 results) No results for input(s): PROBNP in the last 8760 hours. HbA1C: No results for input(s): HGBA1C in the last 72 hours. CBG: No results for input(s): GLUCAP in the last 168 hours. Lipid Profile: No results for input(s): CHOL, HDL, LDLCALC, TRIG,  CHOLHDL, LDLDIRECT in the last 72 hours. Thyroid Function Tests: No results for input(s): TSH, T4TOTAL, FREET4, T3FREE, THYROIDAB in the last 72 hours. Anemia Panel: No results for input(s): VITAMINB12, FOLATE, FERRITIN, TIBC, IRON, RETICCTPCT in the last 72 hours. Urine analysis:    Component Value Date/Time   COLORURINE YELLOW 03/19/2017 0424   APPEARANCEUR CLEAR 03/19/2017 0424   LABSPEC 1.013 03/19/2017 0424   PHURINE 6.0 03/19/2017 0424   GLUCOSEU NEGATIVE 03/19/2017 0424   HGBUR MODERATE (A) 03/19/2017 0424   BILIRUBINUR NEGATIVE 03/19/2017 0424   KETONESUR NEGATIVE 03/19/2017 0424   PROTEINUR NEGATIVE 03/19/2017 0424   UROBILINOGEN 1.0 11/21/2014 2046   NITRITE NEGATIVE 03/19/2017 0424   LEUKOCYTESUR SMALL (A) 03/19/2017 0424   Sepsis Labs: @LABRCNTIP (procalcitonin:4,lacticidven:4)  ) Recent Results (from the past 240 hour(s))  Blood Culture (routine x 2)     Status: Abnormal (Preliminary result)   Collection Time: 03/19/17  4:24 AM  Result Value Ref Range Status   Specimen Description BLOOD RIGHT ANTECUBITAL  Final   Special Requests   Final    BOTTLES DRAWN AEROBIC AND ANAEROBIC Blood Culture adequate volume   Culture  Setup Time   Final    GRAM POSITIVE COCCI IN CLUSTERS IN BOTH AEROBIC AND ANAEROBIC BOTTLES CRITICAL RESULT CALLED TO, READ BACK BY AND VERIFIED WITH: M RELZ 03/20/17 @ 0659 M VESTAL    Culture (A)  Final    STAPHYLOCOCCUS SPECIES (COAGULASE NEGATIVE) THE SIGNIFICANCE OF ISOLATING THIS ORGANISM FROM A SINGLE SET OF BLOOD CULTURES WHEN MULTIPLE SETS ARE DRAWN IS UNCERTAIN. PLEASE NOTIFY THE MICROBIOLOGY DEPARTMENT WITHIN ONE WEEK IF SPECIATION AND SENSITIVITIES ARE REQUIRED. Performed at [facility], [place] [address] [place], [place] [address]    Report Status PENDING  Incomplete  Blood Culture (routine x 2)     Status: None (Preliminary result)   Collection Time: 03/19/17  4:24 AM  Result Value Ref Range Status   Specimen Description BLOOD  RIGHT WRIST  Final   Special Requests   Final    BOTTLES DRAWN AEROBIC AND ANAEROBIC Blood Culture results may not be optimal due to an inadequate volume of blood received in culture bottles   Culture   Final    NO GROWTH 1 DAY Performed at [facility], [place] [address] [place], [place] [address]    Report Status PENDING  Incomplete  Urine culture     Status: Abnormal   Collection Time: 03/19/17  4:24 AM  Result Value Ref Range Status   Specimen Description URINE, CATHETERIZED  Final   Special Requests NONE  Final   Culture (A)  Final    <10,000 COLONIES/mL INSIGNIFICANT GROWTH Performed at [facility]  [address] [place], [place] [address]    Report Status 03/20/2017 FINAL  Final  Blood Culture ID Panel (Reflexed)     Status: Abnormal   Collection Time: 03/19/17  4:24 AM  Result Value Ref Range Status   Enterococcus species NOT DETECTED NOT DETECTED Final   Vancomycin resistance NOT DETECTED NOT DETECTED Final   Listeria monocytogenes NOT DETECTED NOT DETECTED Final   Staphylococcus species DETECTED (A) NOT DETECTED Final    Comment: CRITICAL RESULT CALLED TO, READ BACK BY AND VERIFIED WITH: M RELZ 03/20/17 @ 9147 M VESTAL    Staphylococcus aureus NOT DETECTED NOT DETECTED Final   Methicillin resistance DETECTED (A) NOT DETECTED Final    Comment: CRITICAL RESULT CALLED TO, READ BACK BY AND VERIFIED WITH: M RELZ 03/20/17 @ 8295 M VESTAL    Streptococcus species NOT DETECTED NOT DETECTED Final   Streptococcus agalactiae NOT DETECTED NOT DETECTED Final   Streptococcus pneumoniae NOT DETECTED NOT DETECTED Final   Streptococcus pyogenes NOT DETECTED NOT DETECTED Final   Acinetobacter baumannii NOT DETECTED NOT DETECTED Final   Enterobacteriaceae species NOT DETECTED NOT DETECTED Final   Enterobacter cloacae complex NOT DETECTED NOT DETECTED Final   Escherichia coli NOT DETECTED NOT DETECTED Final   Klebsiella oxytoca NOT DETECTED NOT DETECTED Final   Klebsiella  pneumoniae NOT DETECTED NOT DETECTED Final   Proteus species NOT DETECTED NOT DETECTED Final   Serratia marcescens NOT DETECTED NOT DETECTED Final   Carbapenem resistance NOT DETECTED NOT DETECTED Final   Haemophilus influenzae NOT DETECTED NOT DETECTED Final   Neisseria meningitidis NOT DETECTED NOT DETECTED Final   Pseudomonas aeruginosa NOT DETECTED NOT DETECTED Final   Candida albicans NOT DETECTED NOT DETECTED Final   Candida glabrata NOT DETECTED NOT DETECTED Final   Candida krusei NOT DETECTED NOT DETECTED Final   Candida parapsilosis NOT DETECTED NOT DETECTED Final   Candida tropicalis NOT DETECTED NOT DETECTED Final    Comment: Performed at [facility], [address], [place], [place] [address]         Radiology Studies: No results found.      Scheduled Meds: . enoxaparin (LOVENOX) injection  30 mg Subcutaneous Q24H  . fluticasone  2 spray Each Nare Daily  . polyethylene glycol  17 g Oral BID  . psyllium  1 packet Oral Daily  . zolpidem  5 mg Oral QHS   Continuous Infusions: . piperacillin-tazobactam (ZOSYN)  IV 3.375 g (03/21/17 1411)  . vancomycin 750 mg (03/21/17 0517)     LOS: 2 days    Time spent: 59min    Domenic Polite, MD [facility] Pager [phone]  If 7PM-7AM, please contact night-coverage [email or web address] Password [email or web address] 03/21/2017, 2:16 PM

## 2017-03-21 NOTE — Progress Notes (Signed)
Alerted by pt family member of bruising and swelling to pt L hand.  Assessed site. Minimal edema noted.  Bruising noted to top of L hand.  IV site to L lateral forearm WNL.  Offered ice to area, pt refused.  Reassured family that bruising and edema would improve over time and was likely the result of previous IV.  No other issues noted and pt and family thanked me for checking the site. Primary RN Novella Rob notified.

## 2017-03-22 LAB — CULTURE, BLOOD (ROUTINE X 2): SPECIAL REQUESTS: ADEQUATE

## 2017-03-22 LAB — BASIC METABOLIC PANEL
Anion gap: 6 (ref 5–15)
BUN: 11 mg/dL (ref 6–20)
CALCIUM: 8.2 mg/dL — AB (ref 8.9–10.3)
CO2: 31 mmol/L (ref 22–32)
CREATININE: 0.76 mg/dL (ref 0.44–1.00)
Chloride: 100 mmol/L — ABNORMAL LOW (ref 101–111)
GFR calc Af Amer: >60 mL/min (ref >60)
GFR calc non Af Amer: >60 mL/min (ref >60)
GLUCOSE: 121 mg/dL — AB (ref 65–99)
Potassium: 3.2 mmol/L — ABNORMAL LOW (ref 3.5–5.1)
Sodium: 137 mmol/L (ref 135–145)

## 2017-03-22 LAB — CBC
HEMATOCRIT: 34.2 % — AB (ref 36.0–46.0)
Hemoglobin: 11.1 g/dL — ABNORMAL LOW (ref 12.0–15.0)
MCH: 27.1 pg (ref 26.0–34.0)
MCHC: 32.5 g/dL (ref 30.0–36.0)
MCV: 83.6 fL (ref 78.0–100.0)
Platelets: 326 10*3/uL (ref 150–400)
RBC: 4.09 MIL/uL (ref 3.87–5.11)
RDW: 14.2 % (ref 11.5–15.5)
WBC: 6.5 10*3/uL (ref 4.0–10.5)

## 2017-03-22 MED ORDER — AMLODIPINE BESYLATE 5 MG PO TABS
5.0000 mg | ORAL_TABLET | Freq: Every day | ORAL | Status: DC
  Administered 2017-03-22 – 2017-03-24 (×3): 5 mg via ORAL
  Filled 2017-03-22 (×3): qty 1

## 2017-03-22 MED ORDER — MILK AND MOLASSES ENEMA
1.0000 | Freq: Once | RECTAL | Status: AC
  Administered 2017-03-22: 250 mL via RECTAL
  Filled 2017-03-22: qty 250

## 2017-03-22 NOTE — Progress Notes (Signed)
Patient ID: Taylor Kramer, female   DOB: June 19, 1932, 83 y.o.   MRN: [id number]     Subjective: She reports some pain at her left buttock wound, about the same and not severe. No other complaints.  Objective: Vital signs in last 24 hours: Temp:  [98.4 F (36.9 C)-98.9 F (37.2 C)] 98.4 F (36.9 C) (08/25 0616) Pulse Rate:  [83-85] 83 (08/25 0616) Resp:  [16-18] 16 (08/25 0616) BP: (144-165)/(76-94) 144/76 (08/25 0616) SpO2:  [96 %-97 %] 97 % (08/25 0616)    Intake/Output from previous day: 08/24 0701 - 08/25 0700 In: 770 [P.O.:720; IV Piggyback:50] Out: -  Intake/Output this shift: Total I/O In: 370 [P.O.:120; IV Piggyback:250] Out: -   General appearance: Elderly somewhat chronically ill but alert and pleasant female in no distress Incision/Wound: Decubitus ulcer left buttock examined. Base is about 50%. Eschar and 50% clean granulation tissue. No unusual drainage. No surrounding erythema. There are a few skin edges that appear necrotic back a few millimeters and may require some further minor debridement.  Lab Results:   Recent Labs  03/21/17 0626 03/22/17 0559  WBC 7.0 6.5  HGB 10.7* 11.1*  HCT 33.4* 34.2*  PLT 295 326   BMET  Recent Labs  03/21/17 0626 03/22/17 0559  NA 139 137  K 3.2* 3.2*  CL 104 100*  CO2 29 31  GLUCOSE 115* 121*  BUN 11 11  CREATININE 0.67 0.76  CALCIUM 8.2* 8.2*     Studies/Results: No results found.  Anti-infectives: Anti-infectives    Start     Dose/Rate Route Frequency Ordered Stop   03/20/17 0600  vancomycin (VANCOCIN) IVPB 750 mg/150 ml premix     750 mg 150 mL/hr over 60 Minutes Intravenous Every 24 hours 03/19/17 1105     03/19/17 1400  piperacillin-tazobactam (ZOSYN) IVPB 3.375 g  Status:  Discontinued     3.375 g 12.5 mL/hr over 240 Minutes Intravenous Every 8 hours 03/19/17 1105 03/22/17 1052   03/19/17 1200  piperacillin-tazobactam (ZOSYN) IVPB 3.375 g  Status:  Discontinued     3.375 g 100 mL/hr over 30  Minutes Intravenous Every 6 hours 03/19/17 1002 03/19/17 1105   03/19/17 0415  piperacillin-tazobactam (ZOSYN) IVPB 3.375 g     3.375 g 100 mL/hr over 30 Minutes Intravenous  Once 03/19/17 0403 03/19/17 0527   03/19/17 0415  vancomycin (VANCOCIN) IVPB 1000 mg/200 mL premix     1,000 mg 200 mL/hr over 60 Minutes Intravenous  Once 03/19/17 0403 03/19/17 0702      Assessment/Plan: Left buttock decubitus status post bedside debridement. Patient is stable. Wound is improved from admission. No septic source from this wound identified at present. Would continue current wound management. May require some minor follow-up debridement which could be done at the bedside or as an outpatient.     LOS: 3 days    Nadir Vasques T 03/22/2017

## 2017-03-22 NOTE — Progress Notes (Signed)
PROGRESS NOTE    Taylor Kramer  SEG:[id number] DOB: Dec 07, 1931 DOA: 03/19/2017 PCP: Leeroy Cha, MD  Brief Narrative: 83 year old female with history of hypertension and crest syndrome, wheelchair bound, from a local assisted living facility was admitted 8/22 with fever or chills weakness and increased pain at the site of her sacral decubitus ulcer that she has had for about a year. CT scan without evidence of deep abscess or osteomyelitis Status post bedside debridement  Assessment & Plan:   Wound infection/secondary infection of sacral decubitus ulcer with eschar/ necrotic appearing tissue -Treated with broad-spectrum antibiotics, Blood culture with 1/2 Coag negative staph c/w contaminant -General surgery consult appreciated, s/p bedside debridement -CT scan without evidence of osteomyelitis or abscess -Stop Zosyn and will continue vancomycin today and change to oral antibiotics tomorrow -ambulate, PT eval completed home health recommended    Hypertension -Stable, low-dose amlodipine-will resume    History of CREST syndrome -Continue eyedrops, soft diet per home regimen    Fecal impaction ([facility]) -will give enema today -Remains on laxatives and stool softeners for 2 days without results  DVT prophylaxis: Lovenox Code Status: DO NOT RESUSCITATE Family Communication: No family at bedside Disposition Plan: Likely back to assisted living in 1-2days  Consultants:   CCS  Antimicrobials:   Vancomycin and Zosyn   Subjective: Doing ok, some pain at wound site  Objective: Vitals:   03/21/17 0543 03/21/17 1540 03/21/17 2100 03/22/17 0616  BP: (!) 168/81 (!) 155/94 (!) 165/78 (!) 144/76  Pulse: 74 83 85 83  Resp: 17 17 18 16   Temp: (!) 97 F (36.1 C) 98.9 F (37.2 C) 98.8 F (37.1 C) 98.4 F (36.9 C)  TempSrc: Oral Oral Oral Oral  SpO2: 97% 97% 96% 97%  Weight:      Height:        Intake/Output Summary (Last 24 hours) at 03/22/17 1312 Last data filed at  03/22/17 0920  Gross per 24 hour  Intake              660 ml  Output                0 ml  Net              660 ml   Filed Weights   03/19/17 0448  Weight: 44.9 kg (99 lb)    Examination:  Gen: Awake, Alert, Thinly built, frail female, dysarthric HEENT: PERRLA, Neck supple, no JVD Lungs: Good air movement bilaterally, CTAB CVS: RRR,No Gallops,Rubs or new Murmurs Abd: soft, Non tender, non distended, BS present Right-sided the bottom with decubitus wound status post debridement back to and covered with foam dressing Extremities: No Cyanosis, Clubbing or edema Skin: no new rashes  Data Reviewed:   CBC:  Recent Labs Lab 03/19/17 0424 03/19/17 1250 03/20/17 0909 03/21/17 0626 03/22/17 0559  WBC 15.8* 11.6* 8.7 7.0 6.5  NEUTROABS 13.2*  --   --   --   --   HGB 10.8* 10.9* 10.8* 10.7* 11.1*  HCT 33.9* 34.0* 33.6* 33.4* 34.2*  MCV 84.5 83.7 84.6 82.9 83.6  PLT 355 306 282 295 737   Basic Metabolic Panel:  Recent Labs Lab 03/19/17 0424 03/19/17 1250 03/20/17 0909 03/21/17 0626 03/22/17 0559  NA 139  --  139 139 137  K 4.2  --  3.6 3.2* 3.2*  CL 102  --  105 104 100*  CO2 30  --  30 29 31   GLUCOSE 92  --  113* 115* 121*  BUN 24*  --  12 11 11   CREATININE 0.82 0.69 0.69 0.67 0.76  CALCIUM 8.3*  --  8.2* 8.2* 8.2*   GFR: Estimated Creatinine Clearance: 37.8 mL/min (by C-G formula based on SCr of 0.76 mg/dL). Liver Function Tests:  Recent Labs Lab 03/19/17 0424  AST 22  ALT 10*  ALKPHOS 48  BILITOT 1.2  PROT 6.3*  ALBUMIN 3.0*   No results for input(s): LIPASE, AMYLASE in the last 168 hours. No results for input(s): AMMONIA in the last 168 hours. Coagulation Profile:  Recent Labs Lab 03/20/17 0909  INR 0.97   Cardiac Enzymes: No results for input(s): CKTOTAL, CKMB, CKMBINDEX, TROPONINI in the last 168 hours. BNP (last 3 results) No results for input(s): PROBNP in the last 8760 hours. HbA1C: No results for input(s): HGBA1C in the last 72  hours. CBG: No results for input(s): GLUCAP in the last 168 hours. Lipid Profile: No results for input(s): CHOL, HDL, LDLCALC, TRIG, CHOLHDL, LDLDIRECT in the last 72 hours. Thyroid Function Tests: No results for input(s): TSH, T4TOTAL, FREET4, T3FREE, THYROIDAB in the last 72 hours. Anemia Panel: No results for input(s): VITAMINB12, FOLATE, FERRITIN, TIBC, IRON, RETICCTPCT in the last 72 hours. Urine analysis:    Component Value Date/Time   COLORURINE YELLOW 03/19/2017 0424   APPEARANCEUR CLEAR 03/19/2017 0424   LABSPEC 1.013 03/19/2017 0424   PHURINE 6.0 03/19/2017 0424   GLUCOSEU NEGATIVE 03/19/2017 0424   HGBUR MODERATE (A) 03/19/2017 0424   BILIRUBINUR NEGATIVE 03/19/2017 0424   KETONESUR NEGATIVE 03/19/2017 0424   PROTEINUR NEGATIVE 03/19/2017 0424   UROBILINOGEN 1.0 11/21/2014 2046   NITRITE NEGATIVE 03/19/2017 0424   LEUKOCYTESUR SMALL (A) 03/19/2017 0424   Sepsis Labs: @LABRCNTIP (procalcitonin:4,lacticidven:4)  ) Recent Results (from the past 240 hour(s))  Blood Culture (routine x 2)     Status: Abnormal   Collection Time: 03/19/17  4:24 AM  Result Value Ref Range Status   Specimen Description BLOOD RIGHT ANTECUBITAL  Final   Special Requests   Final    BOTTLES DRAWN AEROBIC AND ANAEROBIC Blood Culture adequate volume   Culture  Setup Time   Final    GRAM POSITIVE COCCI IN CLUSTERS IN BOTH AEROBIC AND ANAEROBIC BOTTLES CRITICAL RESULT CALLED TO, READ BACK BY AND VERIFIED WITH: M RELZ 03/20/17 @ 0659 M VESTAL    Culture (A)  Final    STAPHYLOCOCCUS SPECIES (COAGULASE NEGATIVE) THE SIGNIFICANCE OF ISOLATING THIS ORGANISM FROM A SINGLE SET OF BLOOD CULTURES WHEN MULTIPLE SETS ARE DRAWN IS UNCERTAIN. PLEASE NOTIFY THE MICROBIOLOGY DEPARTMENT WITHIN ONE WEEK IF SPECIATION AND SENSITIVITIES ARE REQUIRED. Performed at [facility], [place] [address] [place], [place] [address]    Report Status 03/22/2017 FINAL  Final  Blood Culture (routine x 2)     Status: None  (Preliminary result)   Collection Time: 03/19/17  4:24 AM  Result Value Ref Range Status   Specimen Description BLOOD RIGHT WRIST  Final   Special Requests   Final    BOTTLES DRAWN AEROBIC AND ANAEROBIC Blood Culture results may not be optimal due to an inadequate volume of blood received in culture bottles   Culture NO GROWTH 3 DAYS  Final   Report Status PENDING  Incomplete  Urine culture     Status: Abnormal   Collection Time: 03/19/17  4:24 AM  Result Value Ref Range Status   Specimen Description URINE, CATHETERIZED  Final   Special Requests NONE  Final   Culture (A)  Final    <  10,000 COLONIES/mL INSIGNIFICANT GROWTH Performed at [facility], [place] [address] [place], [place] [address]    Report Status 03/20/2017 FINAL  Final  Blood Culture ID Panel (Reflexed)     Status: Abnormal   Collection Time: 03/19/17  4:24 AM  Result Value Ref Range Status   Enterococcus species NOT DETECTED NOT DETECTED Final   Vancomycin resistance NOT DETECTED NOT DETECTED Final   Listeria monocytogenes NOT DETECTED NOT DETECTED Final   Staphylococcus species DETECTED (A) NOT DETECTED Final    Comment: CRITICAL RESULT CALLED TO, READ BACK BY AND VERIFIED WITH: M RELZ 03/20/17 @ 6222 M VESTAL    Staphylococcus aureus NOT DETECTED NOT DETECTED Final   Methicillin resistance DETECTED (A) NOT DETECTED Final    Comment: CRITICAL RESULT CALLED TO, READ BACK BY AND VERIFIED WITH: M RELZ 03/20/17 @ 9798 M VESTAL    Streptococcus species NOT DETECTED NOT DETECTED Final   Streptococcus agalactiae NOT DETECTED NOT DETECTED Final   Streptococcus pneumoniae NOT DETECTED NOT DETECTED Final   Streptococcus pyogenes NOT DETECTED NOT DETECTED Final   Acinetobacter baumannii NOT DETECTED NOT DETECTED Final   Enterobacteriaceae species NOT DETECTED NOT DETECTED Final   Enterobacter cloacae complex NOT DETECTED NOT DETECTED Final   Escherichia coli NOT DETECTED NOT DETECTED Final   Klebsiella oxytoca NOT  DETECTED NOT DETECTED Final   Klebsiella pneumoniae NOT DETECTED NOT DETECTED Final   Proteus species NOT DETECTED NOT DETECTED Final   Serratia marcescens NOT DETECTED NOT DETECTED Final   Carbapenem resistance NOT DETECTED NOT DETECTED Final   Haemophilus influenzae NOT DETECTED NOT DETECTED Final   Neisseria meningitidis NOT DETECTED NOT DETECTED Final   Pseudomonas aeruginosa NOT DETECTED NOT DETECTED Final   Candida albicans NOT DETECTED NOT DETECTED Final   Candida glabrata NOT DETECTED NOT DETECTED Final   Candida krusei NOT DETECTED NOT DETECTED Final   Candida parapsilosis NOT DETECTED NOT DETECTED Final   Candida tropicalis NOT DETECTED NOT DETECTED Final    Comment: Performed at [facility], [address] [place], [place] [address]  Anaerobic culture     Status: None (Preliminary result)   Collection Time: 03/20/17 11:12 AM  Result Value Ref Range Status   Specimen Description WOUND ULCER  Final   Special Requests NONE  Final   Culture   Final    HOLDING FOR POSSIBLE ANAEROBE Performed at [facility], [place] [address], [place], [place] [address]    Report Status PENDING  Incomplete         Radiology Studies: No results found.      Scheduled Meds: . enoxaparin (LOVENOX) injection  30 mg Subcutaneous Q24H  . fluticasone  2 spray Each Nare Daily  . milk and molasses  1 enema Rectal Once  . polyethylene glycol  17 g Oral BID  . psyllium  1 packet Oral Daily  . zolpidem  5 mg Oral QHS   Continuous Infusions: . vancomycin 750 mg (03/22/17 0500)     LOS: 3 days    Time spent: 56min    Domenic Polite, MD [facility] Pager [phone]  If 7PM-7AM, please contact night-coverage [email or web address] Password [email or web address] 03/22/2017, 1:12 PM

## 2017-03-22 NOTE — Progress Notes (Signed)
Pharmacy Antibiotic Note  Taylor Kramer is a 83 y.o. female admitted on 03/19/2017 with wound infection.  Pharmacy has been consulted for vancomycin dosing. Patient noted to have decubitus ulcer that is concerning for necrosis.  Vancomycin and zosyn to continue  Today, 03/22/2017  Renal: Scr WNL (Normalized CrCl = 54ml/min)  WBC WNL  Afebrile  Plan:  Continue Vancomycin 750mg  IV q24h  Will check vanc trough tomorrow AM prior to 6am dose  Continue current Zosyn dosing  Daily SCr  Height: 5\' 7"  (170.2 cm) Weight:  (unable to weight  patient ) IBW/kg (Calculated) : 61.6  Temp (24hrs), Avg:98.7 F (37.1 C), Min:98.4 F (36.9 C), Max:98.9 F (37.2 C)   Recent Labs Lab 03/19/17 0424 03/19/17 0443 03/19/17 1250 03/20/17 0909 03/21/17 0626 03/22/17 0559  WBC 15.8*  --  11.6* 8.7 7.0 6.5  CREATININE 0.82  --  0.69 0.69 0.67 0.76  LATICACIDVEN  --  1.18  --   --   --   --     Estimated Creatinine Clearance: 37.8 mL/min (by C-G formula based on SCr of 0.76 mg/dL).    No Known Allergies  Antimicrobials this admission: 8/22 vancomycin >>  8/22 zosyn >>    Thank you for allowing pharmacy to be a part of this patient's care.  Adrian Saran, PharmD, BCPS Pager [phone] 03/22/2017 9:18 AM

## 2017-03-22 NOTE — Progress Notes (Signed)
Occupational Therapy Treatment Patient Details Name: Taylor Kramer MRN: [id number] DOB: [birth year] Today's Date: 03/22/2017    History of present illness 83 y.o. female with past medical history significant for CR EST syndrome and hypertension who was in her usual state of health until yesterday when she felt a little weak and had a mechanical fall without injury.  Dx of fecal impaction, decubitus ulcer of back.    OT comments  Adjusted goals today. Pt states she cannot propel w/c with feet.  She will need help with mobility and adls at ALF.  If they cannot provide needed assistance, she will need SNF  Follow Up Recommendations  Home health OT (with assist for mobility and adls vs snf)    Equipment Recommendations  None recommended by OT    Recommendations for Other Services      Precautions / Restrictions Precautions Precautions: Fall Precaution Comments: reports h/o multiple falls Restrictions Weight Bearing Restrictions: No       Mobility Bed Mobility         Supine to sit: Min assist;HOB elevated     General bed mobility comments: for trunk  Transfers   Equipment used: Rolling walker (2 wheeled)   Sit to Stand: Min assist Stand pivot transfers: Min assist       General transfer comment: steadying assistance; pt ataxic with movement    Balance     Sitting balance-Leahy Scale: Fair       Standing balance-Leahy Scale: Poor                             ADL either performed or assessed with clinical judgement   ADL                           Toilet Transfer: Minimal assistance;Ambulation;RW (to chair)   Toileting- Clothing Manipulation and Hygiene: Maximal assistance;Sit to/from stand         General ADL Comments: pt did not need to use toilet. External catheter was not in place and bed was saturated. Ambulated 4 feet to chair.  Pt states she cannot use feet to propel w/c:  goals adjusted. She also did not feel she could  release a hand in standing today     Vision       Perception     Praxis      Cognition Arousal/Alertness: Awake/alert Behavior During Therapy: WFL for tasks assessed/performed Overall Cognitive Status: Within Functional Limits for tasks assessed                                          Exercises     Shoulder Instructions       General Comments      Pertinent Vitals/ Pain       Pain Score: 5  Pain Location: wound on back Pain Descriptors / Indicators: Sore Pain Intervention(s): Limited activity within patient's tolerance;Monitored during session;Premedicated before session;Repositioned  Home Living                                          Prior Functioning/Environment              Frequency  Min 2X/week  Progress Toward Goals  OT Goals(current goals can now be found in the care plan section)  Progress towards OT goals: Progressing toward goals  ADL Goals Pt Will Perform Grooming: with min guard assist;standing Pt Will Transfer to Toilet: with min guard assist;ambulating;grab bars (high commode)  Plan      Co-evaluation                 AM-PAC PT "6 Clicks" Daily Activity     Outcome Measure   Help from another person eating meals?: None Help from another person taking care of personal grooming?: A Little Help from another person toileting, which includes using toliet, bedpan, or urinal?: A Lot Help from another person bathing (including washing, rinsing, drying)?: A Little Help from another person to put on and taking off regular upper body clothing?: A Little Help from another person to put on and taking off regular lower body clothing?: A Lot 6 Click Score: 17    End of Session    OT Visit Diagnosis: Muscle weakness (generalized) (M62.81)   Activity Tolerance Patient tolerated treatment well   Patient Left in chair;with call bell/phone within reach;with nursing/sitter in room   Nurse  Communication  (NT:  to change bed)        Time: 8938-1017 OT Time Calculation (min): 16 min  Charges: OT General Charges $OT Visit: 1 Procedure OT Treatments $Self Care/Home Management : 8-22 mins  Taylor Kramer, OTR/L [phone] 03/22/2017   Taylor Kramer 03/22/2017, 10:39 AM

## 2017-03-23 LAB — CREATININE, SERUM: CREATININE: 0.79 mg/dL (ref 0.44–1.00)

## 2017-03-23 LAB — VANCOMYCIN, TROUGH: VANCOMYCIN TR: 11 ug/mL — AB (ref 15–20)

## 2017-03-23 MED ORDER — DOXYCYCLINE HYCLATE 100 MG PO TABS
100.0000 mg | ORAL_TABLET | Freq: Two times a day (BID) | ORAL | Status: DC
  Administered 2017-03-23 – 2017-03-24 (×2): 100 mg via ORAL
  Filled 2017-03-23 (×2): qty 1

## 2017-03-23 MED ORDER — POLYETHYLENE GLYCOL 3350 17 G PO PACK
17.0000 g | PACK | Freq: Every day | ORAL | Status: DC
  Filled 2017-03-23: qty 1

## 2017-03-23 MED ORDER — VANCOMYCIN HCL IN DEXTROSE 1-5 GM/200ML-% IV SOLN: 1000.0000 mg | INTRAVENOUS | Status: DC

## 2017-03-23 NOTE — Progress Notes (Signed)
Patient ID: Taylor Kramer, female   DOB: 07-Jan-1932, 83 y.o.   MRN: [id number] [facility] Surgery Progress Note:   * No surgery found *  Subjective: Mental status is alert and cooperative Objective: Vital signs in last 24 hours: Temp:  [97.4 F (36.3 C)-98.9 F (37.2 C)] 97.4 F (36.3 C) (08/26 0442) Pulse Rate:  [82-87] 82 (08/26 0442) Resp:  [16] 16 (08/26 0442) BP: (125-138)/(71-78) 138/74 (08/26 0442) SpO2:  [95 %-96 %] 96 % (08/26 0442) Weight:  [44.9 kg (99 lb)] 44.9 kg (99 lb) (08/25 1325)  Intake/Output from previous day: 08/25 0701 - 08/26 0700 In: 760 [P.O.:360; IV Piggyback:400] Out: -  Intake/Output this shift: Total I/O In: 240 [P.O.:240] Out: -   Physical Exam: Work of breathing is not labored.  Area on the left buttocks has just been dressed.  No surrounding erythema.    Lab Results:  Results for orders placed or performed during the hospital encounter of 03/19/17 (from the past 48 hour(s))  CBC     Status: Abnormal   Collection Time: 03/22/17  5:59 AM  Result Value Ref Range   WBC 6.5 4.0 - 10.5 K/uL   RBC 4.09 3.87 - 5.11 MIL/uL   Hemoglobin 11.1 (L) 12.0 - 15.0 g/dL   HCT 34.2 (L) 36.0 - 46.0 %   MCV 83.6 78.0 - 100.0 fL   MCH 27.1 26.0 - 34.0 pg   MCHC 32.5 30.0 - 36.0 g/dL   RDW 14.2 11.5 - 15.5 %   Platelets 326 150 - 400 K/uL  Basic metabolic panel     Status: Abnormal   Collection Time: 03/22/17  5:59 AM  Result Value Ref Range   Sodium 137 135 - 145 mmol/L   Potassium 3.2 (L) 3.5 - 5.1 mmol/L   Chloride 100 (L) 101 - 111 mmol/L   CO2 31 22 - 32 mmol/L   Glucose, Bld 121 (H) 65 - 99 mg/dL   BUN 11 6 - 20 mg/dL   Creatinine, Ser 0.76 0.44 - 1.00 mg/dL   Calcium 8.2 (L) 8.9 - 10.3 mg/dL   GFR calc non Af Amer >60 >60 mL/min   GFR calc Af Amer >60 >60 mL/min    Comment: (NOTE) The eGFR has been calculated using the CKD EPI equation. This calculation has not been validated in all clinical situations. eGFR's persistently <60 mL/min  signify possible Chronic Kidney Disease.    Anion gap 6 5 - 15  Creatinine, serum     Status: None   Collection Time: 03/23/17  6:03 AM  Result Value Ref Range   Creatinine, Ser 0.79 0.44 - 1.00 mg/dL   GFR calc non Af Amer >60 >60 mL/min   GFR calc Af Amer >60 >60 mL/min    Comment: (NOTE) The eGFR has been calculated using the CKD EPI equation. This calculation has not been validated in all clinical situations. eGFR's persistently <60 mL/min signify possible Chronic Kidney Disease.   Vancomycin, trough     Status: Abnormal   Collection Time: 03/23/17  6:03 AM  Result Value Ref Range   Vancomycin Tr 11 (L) 15 - 20 ug/mL    Radiology/Results: No results found.  Anti-infectives: Anti-infectives    Start     Dose/Rate Route Frequency Ordered Stop   03/24/17 0600  vancomycin (VANCOCIN) IVPB 1000 mg/200 mL premix     1,000 mg 200 mL/hr over 60 Minutes Intravenous Every 24 hours 03/23/17 0850     03/20/17 0600  vancomycin (VANCOCIN)  IVPB 750 mg/150 ml premix  Status:  Discontinued     750 mg 150 mL/hr over 60 Minutes Intravenous Every 24 hours 03/19/17 1105 03/23/17 0850   03/19/17 1400  piperacillin-tazobactam (ZOSYN) IVPB 3.375 g  Status:  Discontinued     3.375 g 12.5 mL/hr over 240 Minutes Intravenous Every 8 hours 03/19/17 1105 03/22/17 1052   03/19/17 1200  piperacillin-tazobactam (ZOSYN) IVPB 3.375 g  Status:  Discontinued     3.375 g 100 mL/hr over 30 Minutes Intravenous Every 6 hours 03/19/17 1002 03/19/17 1105   03/19/17 0415  piperacillin-tazobactam (ZOSYN) IVPB 3.375 g     3.375 g 100 mL/hr over 30 Minutes Intravenous  Once 03/19/17 0403 03/19/17 0527   03/19/17 0415  vancomycin (VANCOCIN) IVPB 1000 mg/200 mL premix     1,000 mg 200 mL/hr over 60 Minutes Intravenous  Once 03/19/17 0403 03/19/17 3570      Assessment/Plan: Problem List: Patient Active Problem List   Diagnosis Date Noted  . Decubitus ulcer of back 03/19/2017  . Fecal impaction ([place])  03/19/2017  . UTI (lower urinary tract infection) 11/21/2014  . Near syncope 11/21/2014  . Hypertension 11/21/2014  . History of CREST syndrome 11/21/2014    Decubitus management appropriate.   * No surgery found *    LOS: 4 days   Matt B. Hassell Done, MD, [facility] [phone] beeper [phone]  03/23/2017 11:09 AM

## 2017-03-23 NOTE — Progress Notes (Signed)
Pharmacy Antibiotic Note  Taylor Kramer is a 83 y.o. female admitted on 03/19/2017 with wound infection.  Pharmacy has been consulted for vancomycin dosing. Patient noted to have decubitus ulcer that is concerning for necrosis.  Vancomycin and zosyn to continue   Plan:  Vanc trough subtherapeutic (goal 15-20) - change vanc from 750mg  to 1g IV q24  Noted per Md notes that plan to change to PO abx's today anyway  Height: 5\' 7"  (170.2 cm) Weight: 99 lb (44.9 kg) IBW/kg (Calculated) : 61.6  Temp (24hrs), Avg:98.2 F (36.8 C), Min:97.4 F (36.3 C), Max:98.9 F (37.2 C)   Recent Labs Lab 03/19/17 0424 03/19/17 0443 03/19/17 1250 03/20/17 0909 03/21/17 0626 03/22/17 0559 03/23/17 0603  WBC 15.8*  --  11.6* 8.7 7.0 6.5  --   CREATININE 0.82  --  0.69 0.69 0.67 0.76 0.79  LATICACIDVEN  --  1.18  --   --   --   --   --   VANCOTROUGH  --   --   --   --   --   --  11*    Estimated Creatinine Clearance: 37.8 mL/min (by C-G formula based on SCr of 0.79 mg/dL).    No Known Allergies  Antimicrobials this admission: 8/22 vancomycin >>  8/22 zosyn >> 8/25   Thank you for allowing pharmacy to be a part of this patient's care.  Adrian Saran, PharmD, BCPS Pager [phone] 03/23/2017 8:49 AM

## 2017-03-23 NOTE — Progress Notes (Signed)
PROGRESS NOTE    Taylor Kramer  CHE:[id number] DOB: Dec 20, 1931 DOA: 03/19/2017 PCP: Leeroy Cha, MD  Brief Narrative: 83 year old female with history of hypertension and crest syndrome, wheelchair bound, from a local assisted living facility was admitted 8/22 with fever or chills weakness and increased pain at the site of her sacral decubitus ulcer that she has had for about a year. CT scan without evidence of deep abscess or osteomyelitis Status post bedside debridement  Assessment & Plan:   Wound infection/secondary infection of sacral decubitus ulcer with eschar/ necrotic appearing tissue -Treated with broad-spectrum antibiotics, Blood culture with 1/2 Coag negative staph c/w contaminant -General surgery consult appreciated, s/p bedside debridement -CT scan without evidence of osteomyelitis or abscess -improving, may need more debridement per [facility],  -now off Zosyn, change to Po DOxycycline for 4days -ambulate, PT eval completed home health recommended    Fecal impaction /acute on chronic constipation -severe impaction, improved after enema last pm, having frequent stools now -will back off on laxatives now, will need scheduled laxatives upon DC back to ALF    Hypertension -Stable, low-dose amlodipine-will resume    History of CREST syndrome -Continue eyedrops, soft diet per home regimen  DVT prophylaxis: Lovenox Code Status: DO NOT RESUSCITATE Family Communication: son at bedside 8/25 Disposition Plan: Likely back to assisted living tomorrow  Consultants:   CCS  Antimicrobials:   Vancomycin and Zosyn   Subjective: Doing ok, some pain at wound site  Objective: Vitals:   03/22/17 0616 03/22/17 1325 03/22/17 1954 03/23/17 0442  BP: (!) 144/76 125/71 133/78 138/74  Pulse: 83 87 87 82  Resp: 16 16 16 16   Temp: 98.4 F (36.9 C) 98.3 F (36.8 C) 98.9 F (37.2 C) (!) 97.4 F (36.3 C)  TempSrc: Oral Oral Oral Oral  SpO2: 97% 95% 96% 96%  Weight:  44.9  kg (99 lb)    Height:        Intake/Output Summary (Last 24 hours) at 03/23/17 1155 Last data filed at 03/23/17 1005  Gross per 24 hour  Intake              630 ml  Output                0 ml  Net              630 ml   Filed Weights   03/19/17 0448 03/22/17 1325  Weight: 44.9 kg (99 lb) 44.9 kg (99 lb)    Examination:  Gen: Awake, Alert, thinly built, frail, dysarthric HEENT: PERRLA, Neck supple, no JVD Lungs: Good air movement bilaterally, CTAB CVS: RRR,No Gallops,Rubs or new Murmurs Abd: soft, Non tender, non distended, BS present Extremities: No Cyanosis, Clubbing or edema Skin: no new rashes Right-sided the bottom with decubitus wound status post debridement back to and covered with foam dressing  Data Reviewed:   CBC:  Recent Labs Lab 03/19/17 0424 03/19/17 1250 03/20/17 0909 03/21/17 0626 03/22/17 0559  WBC 15.8* 11.6* 8.7 7.0 6.5  NEUTROABS 13.2*  --   --   --   --   HGB 10.8* 10.9* 10.8* 10.7* 11.1*  HCT 33.9* 34.0* 33.6* 33.4* 34.2*  MCV 84.5 83.7 84.6 82.9 83.6  PLT 355 306 282 295 536   Basic Metabolic Panel:  Recent Labs Lab 03/19/17 0424 03/19/17 1250 03/20/17 0909 03/21/17 0626 03/22/17 0559 03/23/17 0603  NA 139  --  139 139 137  --   K 4.2  --  3.6 3.2* 3.2*  --  CL 102  --  105 104 100*  --   CO2 30  --  30 29 31   --   GLUCOSE 92  --  113* 115* 121*  --   BUN 24*  --  12 11 11   --   CREATININE 0.82 0.69 0.69 0.67 0.76 0.79  CALCIUM 8.3*  --  8.2* 8.2* 8.2*  --    GFR: Estimated Creatinine Clearance: 37.8 mL/min (by C-G formula based on SCr of 0.79 mg/dL). Liver Function Tests:  Recent Labs Lab 03/19/17 0424  AST 22  ALT 10*  ALKPHOS 48  BILITOT 1.2  PROT 6.3*  ALBUMIN 3.0*   No results for input(s): LIPASE, AMYLASE in the last 168 hours. No results for input(s): AMMONIA in the last 168 hours. Coagulation Profile:  Recent Labs Lab 03/20/17 0909  INR 0.97   Cardiac Enzymes: No results for input(s): CKTOTAL, CKMB,  CKMBINDEX, TROPONINI in the last 168 hours. BNP (last 3 results) No results for input(s): PROBNP in the last 8760 hours. HbA1C: No results for input(s): HGBA1C in the last 72 hours. CBG: No results for input(s): GLUCAP in the last 168 hours. Lipid Profile: No results for input(s): CHOL, HDL, LDLCALC, TRIG, CHOLHDL, LDLDIRECT in the last 72 hours. Thyroid Function Tests: No results for input(s): TSH, T4TOTAL, FREET4, T3FREE, THYROIDAB in the last 72 hours. Anemia Panel: No results for input(s): VITAMINB12, FOLATE, FERRITIN, TIBC, IRON, RETICCTPCT in the last 72 hours. Urine analysis:    Component Value Date/Time   COLORURINE YELLOW 03/19/2017 0424   APPEARANCEUR CLEAR 03/19/2017 0424   LABSPEC 1.013 03/19/2017 0424   PHURINE 6.0 03/19/2017 0424   GLUCOSEU NEGATIVE 03/19/2017 0424   HGBUR MODERATE (A) 03/19/2017 0424   BILIRUBINUR NEGATIVE 03/19/2017 0424   KETONESUR NEGATIVE 03/19/2017 0424   PROTEINUR NEGATIVE 03/19/2017 0424   UROBILINOGEN 1.0 11/21/2014 2046   NITRITE NEGATIVE 03/19/2017 0424   LEUKOCYTESUR SMALL (A) 03/19/2017 0424   Sepsis Labs: @LABRCNTIP (procalcitonin:4,lacticidven:4)  ) Recent Results (from the past 240 hour(s))  Blood Culture (routine x 2)     Status: Abnormal   Collection Time: 03/19/17  4:24 AM  Result Value Ref Range Status   Specimen Description BLOOD RIGHT ANTECUBITAL  Final   Special Requests   Final    BOTTLES DRAWN AEROBIC AND ANAEROBIC Blood Culture adequate volume   Culture  Setup Time   Final    GRAM POSITIVE COCCI IN CLUSTERS IN BOTH AEROBIC AND ANAEROBIC BOTTLES CRITICAL RESULT CALLED TO, READ BACK BY AND VERIFIED WITH: M RELZ 03/20/17 @ 0659 M VESTAL    Culture (A)  Final    STAPHYLOCOCCUS SPECIES (COAGULASE NEGATIVE) THE SIGNIFICANCE OF ISOLATING THIS ORGANISM FROM A SINGLE SET OF BLOOD CULTURES WHEN MULTIPLE SETS ARE DRAWN IS UNCERTAIN. PLEASE NOTIFY THE MICROBIOLOGY DEPARTMENT WITHIN ONE WEEK IF SPECIATION AND SENSITIVITIES ARE  REQUIRED. Performed at [facility], [place] [address], [place], [place] [address]    Report Status 03/22/2017 FINAL  Final  Blood Culture (routine x 2)     Status: None (Preliminary result)   Collection Time: 03/19/17  4:24 AM  Result Value Ref Range Status   Specimen Description BLOOD RIGHT WRIST  Final   Special Requests   Final    BOTTLES DRAWN AEROBIC AND ANAEROBIC Blood Culture results may not be optimal due to an inadequate volume of blood received in culture bottles   Culture   Final    NO GROWTH 4 DAYS Performed at [facility],  [address], [place], [place] [address]    Report Status PENDING  Incomplete  Urine culture     Status: Abnormal   Collection Time: 03/19/17  4:24 AM  Result Value Ref Range Status   Specimen Description URINE, CATHETERIZED  Final   Special Requests NONE  Final   Culture (A)  Final    <10,000 COLONIES/mL INSIGNIFICANT GROWTH Performed at [place] [address], [place], [place] [address]    Report Status 03/20/2017 FINAL  Final  Blood Culture ID Panel (Reflexed)     Status: Abnormal   Collection Time: 03/19/17  4:24 AM  Result Value Ref Range Status   Enterococcus species NOT DETECTED NOT DETECTED Final   Vancomycin resistance NOT DETECTED NOT DETECTED Final   Listeria monocytogenes NOT DETECTED NOT DETECTED Final   Staphylococcus species DETECTED (A) NOT DETECTED Final    Comment: CRITICAL RESULT CALLED TO, READ BACK BY AND VERIFIED WITH: M RELZ 03/20/17 @ 0086 M VESTAL    Staphylococcus aureus NOT DETECTED NOT DETECTED Final   Methicillin resistance DETECTED (A) NOT DETECTED Final    Comment: CRITICAL RESULT CALLED TO, READ BACK BY AND VERIFIED WITH: M RELZ 03/20/17 @ 7619 M VESTAL    Streptococcus species NOT DETECTED NOT DETECTED Final   Streptococcus agalactiae NOT DETECTED NOT DETECTED Final   Streptococcus pneumoniae NOT DETECTED NOT DETECTED Final   Streptococcus pyogenes NOT DETECTED NOT DETECTED Final    Acinetobacter baumannii NOT DETECTED NOT DETECTED Final   Enterobacteriaceae species NOT DETECTED NOT DETECTED Final   Enterobacter cloacae complex NOT DETECTED NOT DETECTED Final   Escherichia coli NOT DETECTED NOT DETECTED Final   Klebsiella oxytoca NOT DETECTED NOT DETECTED Final   Klebsiella pneumoniae NOT DETECTED NOT DETECTED Final   Proteus species NOT DETECTED NOT DETECTED Final   Serratia marcescens NOT DETECTED NOT DETECTED Final   Carbapenem resistance NOT DETECTED NOT DETECTED Final   Haemophilus influenzae NOT DETECTED NOT DETECTED Final   Neisseria meningitidis NOT DETECTED NOT DETECTED Final   Pseudomonas aeruginosa NOT DETECTED NOT DETECTED Final   Candida albicans NOT DETECTED NOT DETECTED Final   Candida glabrata NOT DETECTED NOT DETECTED Final   Candida krusei NOT DETECTED NOT DETECTED Final   Candida parapsilosis NOT DETECTED NOT DETECTED Final   Candida tropicalis NOT DETECTED NOT DETECTED Final    Comment: Performed at [facility], [address] [place], [place] [address]  Anaerobic culture     Status: None (Preliminary result)   Collection Time: 03/20/17 11:12 AM  Result Value Ref Range Status   Specimen Description WOUND ULCER  Final   Special Requests NONE  Final   Culture   Final    HOLDING FOR POSSIBLE ANAEROBE Performed at [facility], [place] [address] [place], [place] [address]    Report Status PENDING  Incomplete         Radiology Studies: No results found.      Scheduled Meds: . amLODipine  5 mg Oral Daily  . enoxaparin (LOVENOX) injection  30 mg Subcutaneous Q24H  . fluticasone  2 spray Each Nare Daily  . [START ON 03/24/2017] polyethylene glycol  17 g Oral Daily  . zolpidem  5 mg Oral QHS   Continuous Infusions: . [START ON 03/24/2017] vancomycin       LOS: 4 days    Time spent: 76min    Domenic Polite, MD [facility] Pager [phone]  If 7PM-7AM, please contact  night-coverage  [email or web address] Password [facility] 03/23/2017, 11:55 AM

## 2017-03-24 DIAGNOSIS — L03317 Cellulitis of buttock: Secondary | ICD-10-CM

## 2017-03-24 LAB — CBC
HEMATOCRIT: 34.9 % — AB (ref 36.0–46.0)
HEMOGLOBIN: 11.1 g/dL — AB (ref 12.0–15.0)
MCH: 26.6 pg (ref 26.0–34.0)
MCHC: 31.8 g/dL (ref 30.0–36.0)
MCV: 83.5 fL (ref 78.0–100.0)
Platelets: 337 10*3/uL (ref 150–400)
RBC: 4.18 MIL/uL (ref 3.87–5.11)
RDW: 14.3 % (ref 11.5–15.5)
WBC: 7.2 10*3/uL (ref 4.0–10.5)

## 2017-03-24 LAB — BASIC METABOLIC PANEL
ANION GAP: 8 (ref 5–15)
BUN: 17 mg/dL (ref 6–20)
CHLORIDE: 101 mmol/L (ref 101–111)
CO2: 29 mmol/L (ref 22–32)
CREATININE: 0.79 mg/dL (ref 0.44–1.00)
Calcium: 8.5 mg/dL — ABNORMAL LOW (ref 8.9–10.3)
GFR calc non Af Amer: >60 mL/min (ref >60)
GLUCOSE: 77 mg/dL (ref 65–99)
Potassium: 3.5 mmol/L (ref 3.5–5.1)
Sodium: 138 mmol/L (ref 135–145)

## 2017-03-24 LAB — CULTURE, BLOOD (ROUTINE X 2): Culture: NO GROWTH

## 2017-03-24 MED ORDER — DOXYCYCLINE HYCLATE 100 MG PO TABS: 100.0000 mg | ORAL_TABLET | Freq: Two times a day (BID) | ORAL | 0 refills | Status: DC

## 2017-03-24 MED ORDER — SENNOSIDES-DOCUSATE SODIUM 8.6-50 MG PO TABS: 1.0000 | ORAL_TABLET | Freq: Two times a day (BID) | ORAL | 0 refills | Status: AC

## 2017-03-24 NOTE — Progress Notes (Addendum)
CSW spoke with [facility] Nurse at [place], she reports patient is able to return because wound is a Stage 3 after debridement was complete. CSW faxed signed FL2. Patient therapy notes indicate Nanine Means will provide PT. Patient will need Wound care nurse through facility home health agency, RNCM notified to arrange.  Patient son will transport, per nurse. Nurse given number for report.   Kathrin Greathouse, Latanya Presser, MSW Clinical Social Worker 5E and Psychiatric Service Line [phone] 03/24/2017  4:06 PM

## 2017-03-24 NOTE — Progress Notes (Signed)
Physical Therapy Treatment Patient Details Name: Taylor Kramer MRN: [id number] DOB: May 09, 1932 Today's Date: 03/24/2017    History of Present Illness   83 year old female with history of hypertension and crest syndrome, wheelchair bound, from a local assisted living facility was admitted 8/22 with fever or chills weakness and increased pain at the site of her sacral decubitus ulcer that she has had for about a year.    PT Comments    Pt in bed incont urine/BM.  Assisted OOB to [facility].  Assisted with hygiene.  Sit to stand three times to perform peri care and don under garments then transfer to recliner.    Follow Up Recommendations  Home health PT;Supervision for mobility/OOB at [place] Recommendations  None recommended by PT    Recommendations for Other Services       Precautions / Restrictions Precautions Precautions: Fall Precaution Comments: reports h/o multiple falls Restrictions Weight Bearing Restrictions: No    Mobility  Bed Mobility Overal bed mobility: Needs Assistance Bed Mobility: Supine to Sit     Supine to sit: Min assist     General bed mobility comments: assist for upper body and scooting to EOB.  Once upright, pt able to static sit at Supervision level.   Transfers Overall transfer level: Needs assistance Equipment used: Rolling walker (2 wheeled) Transfers: Sit to/from Stand Sit to Stand: Min assist;Mod assist         General transfer comment: steadying assistance; pt ataxic with movement.  sit to stand x 3 for [facility] transfer and peri care.  Very unsteady static standing balance.  Ataxic.    Ambulation/Gait             General Gait Details: transfers only this session due to weakness and instability   Stairs            Wheelchair Mobility    Modified Rankin (Stroke Patients Only)       Balance                                            Cognition Arousal/Alertness: Awake/alert Behavior  During Therapy: WFL for tasks assessed/performed Overall Cognitive Status: Within Functional Limits for tasks assessed                                 General Comments: pleasant      Exercises      General Comments        Pertinent Vitals/Pain Pain Assessment: No/denies pain    Home Living                      Prior Function            PT Goals (current goals can now be found in the care plan section) Progress towards PT goals: Progressing toward goals    Frequency    Min 3X/week      PT Plan Current plan remains appropriate    Co-evaluation              AM-PAC PT "6 Clicks" Daily Activity  Outcome Measure  Difficulty turning over in bed (including adjusting bedclothes, sheets and blankets)?: A Lot Difficulty moving from lying on back to sitting on the side of the bed? : Unable Difficulty sitting down on and  standing up from a chair with arms (e.g., wheelchair, bedside commode, etc,.)?: Unable Help needed moving to and from a bed to chair (including a wheelchair)?: A Lot Help needed walking in hospital room?: A Lot Help needed climbing 3-5 steps with a railing? : Total 6 Click Score: 9    End of Session Equipment Utilized During Treatment: Gait belt Activity Tolerance: Patient tolerated treatment well Patient left: in chair;with call bell/phone within reach Nurse Communication: Mobility status PT Visit Diagnosis: Difficulty in walking, not elsewhere classified (R26.2);Repeated falls (R29.6);Other abnormalities of gait and mobility (R26.89)     Time: 1027-2536 PT Time Calculation (min) (ACUTE ONLY): 24 min  Charges:  $Therapeutic Activity: 23-37 mins                    G Codes:       Rica Koyanagi  PTA [facility]  Acute  Rehab Pager      [phone]

## 2017-03-24 NOTE — Care Management Note (Signed)
Case Management Note  Patient Details  Name: Taylor Kramer MRN: [id number] Date of Birth: 1932-04-17  Subjective/Objective:                  Sacral wound infection of decubitus/s/p bedside debridement.  Action/Plan: Date:  March 24, 2017  Chart reviewed for concurrent status and case management needs.  Will continue to follow patient progress.  Discharge Planning: following for needs  Expected discharge date: [id number]  Velva Harman, BSN, [place], [place]   Expected Discharge Date:   (unknown)               Expected Discharge Plan:   (From [facility])  In-House Referral:  Clinical Social Work  Discharge planning Services  CM Consult  Post Acute Care Choice:    Choice offered to:  Patient, Adult Children  DME Arranged:    DME Agency:     HH Arranged:    [place] Agency:     Status of Service:  In process, will continue to follow  If discussed at Long Length of Stay Meetings, dates discussed:    Additional Comments:  Leeroy Cha, RN 03/24/2017, 9:33 AM

## 2017-03-24 NOTE — Progress Notes (Signed)
Pt discharged back to [facility] via son. Discharge paperwork given and all questions answered.

## 2017-03-24 NOTE — NC FL2 (Signed)
[place] LEVEL OF CARE SCREENING TOOL     IDENTIFICATION  Patient Name: Taylor Kramer Birthdate: 1931-09-05 Sex: female Admission Date (Current Location): 03/19/2017  [facility] and [place] Number:  Herbalist and Address:  [facility],  [place] [address], [place]      Provider Number: [id number]  Attending Physician Name and Address:  Domenic Polite, MD  Relative Name and Phone Number:       Current Level of Care: Hospital Recommended Level of Care: [place] Prior Approval Number:    Date Approved/Denied:   PASRR Number:    Discharge Plan:  (ALF)    Current Diagnoses: Patient Active Problem List   Diagnosis Date Noted  . Cellulitis of left buttock   . Decubitus ulcer of back 03/19/2017  . Fecal impaction ([place]) 03/19/2017  . UTI (lower urinary tract infection) 11/21/2014  . Near syncope 11/21/2014  . Hypertension 11/21/2014  . History of CREST syndrome 11/21/2014    Orientation RESPIRATION BLADDER Height & Weight     Self, Time, Situation, Place  Normal Incontinent Weight: 99 lb (44.9 kg) Height:  5\' 7"  (170.2 cm)  BEHAVIORAL SYMPTOMS/MOOD NEUROLOGICAL BOWEL NUTRITION STATUS      Incontinent Diet (Low Sodium Heart Healthy )  AMBULATORY STATUS COMMUNICATION OF NEEDS Skin   Limited Assist Non-Verbally PU Stage and Appropriate Care       PU Stage 3 Dressing:  (Moist to Dry/ Sacrum/ Full thickness tissure w/ exposed bone / tendon or muscle)               Personal Care Assistance Level of Assistance  Bathing, Feeding, Dressing Bathing Assistance: Limited assistance Feeding assistance: Independent Dressing Assistance: Limited assistance     Functional Limitations Info  Sight, Hearing, Speech Sight Info: Impaired Hearing Info: Adequate Speech Info: Adequate    SPECIAL CARE FACTORS FREQUENCY  PT (By licensed PT), OT (By licensed OT)     PT Frequency: Min 3X/week OT Frequency: Min 3X/week             Contractures Contractures Info: Not present    Additional Factors Info  Code Status, Allergies Code Status Info: DNR Allergies Info: No Known Allergies           Current Medications (03/24/2017):  This is the current hospital active medication list Current Facility-Administered Medications  Medication Dose Route Frequency Provider Last Rate Last Dose  . acetaminophen (TYLENOL) tablet 650 mg  650 mg Oral Q6H PRN Vashti Hey, MD       Or  . acetaminophen (TYLENOL) suppository 650 mg  650 mg Rectal Q6H PRN Bonnell Public Tublu, MD      . amLODipine (NORVASC) tablet 5 mg  5 mg Oral Daily Domenic Polite, MD   5 mg at 03/24/17 0809  . bisacodyl (DULCOLAX) EC tablet 10 mg  10 mg Oral Daily PRN Bonnell Public Tublu, MD      . doxycycline (VIBRA-TABS) tablet 100 mg  100 mg Oral Q12H Domenic Polite, MD   100 mg at 03/24/17 0809  . enoxaparin (LOVENOX) injection 30 mg  30 mg Subcutaneous Q24H Bonnell Public Tublu, MD   30 mg at 03/23/17 1839  . fluticasone (FLONASE) 50 MCG/ACT nasal spray 2 spray  2 spray Each Nare Daily Vashti Hey, MD   2 spray at 03/24/17 0809  . morphine 2 MG/ML injection 2 mg  2 mg Intravenous Q4H PRN Domenic Polite, MD   2 mg at 03/21/17  2028  . ondansetron (ZOFRAN) tablet 4 mg  4 mg Oral Q6H PRN Vashti Hey, MD       Or  . ondansetron [facility]) injection 4 mg  4 mg Intravenous Q6H PRN Bonnell Public Tublu, MD      . polyethylene glycol (MIRALAX / GLYCOLAX) packet 17 g  17 g Oral Daily Domenic Polite, MD      . polyvinyl alcohol (LIQUIFILM TEARS) 1.4 % ophthalmic solution 1 drop  1 drop Both Eyes PRN Bonnell Public Tublu, MD      . zolpidem (AMBIEN) tablet 5 mg  5 mg Oral QHS Bonnell Public Tublu, MD   5 mg at 03/23/17 2025     Discharge Medications: Please see discharge summary for a list of discharge medications.  Relevant Imaging Results:  Relevant Lab Results:   Additional  Information Ssn# [id number]  Lia Hopping, LCSW

## 2017-03-24 NOTE — Progress Notes (Signed)
[facility] Progress Note     Subjective: CC: No complaints Patient ready to get home to ALF today. No worsened pain from L buttocks wound. Understands importance of wound care and knows to f/u in 1 week with [facility].   Objective: Vital signs in last 24 hours: Temp:  [98.5 F (36.9 C)-98.6 F (37 C)] 98.6 F (37 C) (08/27 1425) Pulse Rate:  [86-91] 91 (08/27 1425) Resp:  [16-18] 18 (08/27 1425) BP: (110-138)/(54-69) 110/54 (08/27 1425) SpO2:  [96 %-97 %] 97 % (08/27 1425) Last BM Date: 03/24/17  Intake/Output from previous day: 08/26 0701 - 08/27 0700 In: 240 [P.O.:240] Out: -  Intake/Output this shift: No intake/output data recorded.  PE: Gen:  Alert, NAD, pleasant Pulm:  Normal effort Abd: Soft, non-tender, non-distended Skin: Left buttock wound clean with some slough present, minimal drainage.  Psych: A&Ox3   Lab Results:   Recent Labs  03/22/17 0559 03/24/17 0520  WBC 6.5 7.2  HGB 11.1* 11.1*  HCT 34.2* 34.9*  PLT 326 337   BMET  Recent Labs  03/22/17 0559 03/23/17 0603 03/24/17 0520  NA 137  --  138  K 3.2*  --  3.5  CL 100*  --  101  CO2 31  --  29  GLUCOSE 121*  --  77  BUN 11  --  17  CREATININE 0.76 0.79 0.79  CALCIUM 8.2*  --  8.5*   PT/INR No results for input(s): LABPROT, INR in the last 72 hours. CMP     Component Value Date/Time   NA 138 03/24/2017 0520   K 3.5 03/24/2017 0520   CL 101 03/24/2017 0520   CO2 29 03/24/2017 0520   GLUCOSE 77 03/24/2017 0520   BUN 17 03/24/2017 0520   CREATININE 0.79 03/24/2017 0520   CALCIUM 8.5 (L) 03/24/2017 0520   PROT 6.3 (L) 03/19/2017 0424   ALBUMIN 3.0 (L) 03/19/2017 0424   AST 22 03/19/2017 0424   ALT 10 (L) 03/19/2017 0424   ALKPHOS 48 03/19/2017 0424   BILITOT 1.2 03/19/2017 0424   GFRNONAA >60 03/24/2017 0520   GFRAA >60 03/24/2017 0520   Lipase  No results found for: LIPASE     Studies/Results: No results found.  Anti-infectives: Anti-infectives    Start      Dose/Rate Route Frequency Ordered Stop   03/24/17 0600  vancomycin (VANCOCIN) IVPB 1000 mg/200 mL premix  Status:  Discontinued     1,000 mg 200 mL/hr over 60 Minutes Intravenous Every 24 hours 03/23/17 0850 03/23/17 1200   03/24/17 0000  doxycycline (VIBRA-TABS) 100 MG tablet     100 mg Oral Every 12 hours 03/24/17 1129     03/23/17 2200  doxycycline (VIBRA-TABS) tablet 100 mg     100 mg Oral Every 12 hours 03/23/17 1200     03/20/17 0600  vancomycin (VANCOCIN) IVPB 750 mg/150 ml premix  Status:  Discontinued     750 mg 150 mL/hr over 60 Minutes Intravenous Every 24 hours 03/19/17 1105 03/23/17 0850   03/19/17 1400  piperacillin-tazobactam (ZOSYN) IVPB 3.375 g  Status:  Discontinued     3.375 g 12.5 mL/hr over 240 Minutes Intravenous Every 8 hours 03/19/17 1105 03/22/17 1052   03/19/17 1200  piperacillin-tazobactam (ZOSYN) IVPB 3.375 g  Status:  Discontinued     3.375 g 100 mL/hr over 30 Minutes Intravenous Every 6 hours 03/19/17 1002 03/19/17 1105   03/19/17 0415  piperacillin-tazobactam (ZOSYN) IVPB 3.375 g     3.375  g 100 mL/hr over 30 Minutes Intravenous  Once 03/19/17 0403 03/19/17 0527   03/19/17 0415  vancomycin (VANCOCIN) IVPB 1000 mg/200 mL premix     1,000 mg 200 mL/hr over 60 Minutes Intravenous  Once 03/19/17 0403 03/19/17 0702       Assessment/Plan Decubitus ulcer S/P excisional debridement of skin and subQ at the bedside 8/23 - CT: Skin thickening and subcutaneous edema with soft tissue ulcer overlying the left sacrum. No abscess or CT findings of osteomyelitis - chronic wound  - saline WTD dressings TID after debridement and try to keep pressure off area - wound cultures without growth Sepsis- physiologyimproving; blood cx positive for methicillin resistant staph Fecal impaction- ok to use enema from our standpoint, just make sure dressing is kept clean HTN Hx of CREST syndrome Wheelchair bound  FEN- HH diet VTE- SCDs, lovenox ID- IV zosyn and vanc  (8/22>>), blood cx positive for methacillin resistant staph  Plan: Continue TID dressing changes. Stable for discharge. Can follow up in the office in 1 week, may need further debridement at that time.   LOS: 5 days    Brigid Re , [facility] 03/24/2017, 3:38 PM Pager: [phone] Consults: [phone] Mon-Fri 7:00 am-4:30 pm Sat-Sun 7:00 am-11:30 am

## 2017-03-24 NOTE — Care Management Important Message (Signed)
Important Message  Patient Details  Name: Ghislaine Harcum MRN: [id number] Date of Birth: [birth year]   Medicare Important Message Given:  Yes    Kerin Salen 03/24/2017, 10:58 AMImportant Message  Patient Details  Name: Lile Mccurley MRN: [id number] Date of Birth: 11-05-1931   Medicare Important Message Given:  Yes    Kerin Salen 03/24/2017, 10:58 AM

## 2017-03-24 NOTE — Progress Notes (Addendum)
Date: March 24, 2017 Chart reviewed for discharge orders: None found for case management. [facility]-Nichole Sinclair CSW-patient needs hhc for wound care/no orders in system/ tct-Dr. Joseph/will put in orders/tct-Drew at [facility] will look at orders in the system and have wound care done. Vernia Buff, [phone]

## 2017-03-24 NOTE — Discharge Summary (Addendum)
Physician Discharge Summary  Arlys Scatena EXB:[id number] DOB: 03-31-1932 DOA: 03/19/2017  PCP: Leeroy Cha, MD  Admit date: 03/19/2017 Discharge date: 03/24/2017  Time spent: 35 minutes  Recommendations for Outpatient Follow-up:  1. Needs Follow Up with [facility] in 1 week for possibly more debridement-please ensure Follow up 2. PCP in 1 week 3. Wound Care-Saline WTD dressing to left buttock wound BID or prn if soiled. Top with foam dressing for pressure relief.   Discharge Diagnoses:  Principal Problem:   Decubitus ulcer of back with infection Active Problems:   Hypertension   History of CREST syndrome   Fecal impaction (HCC)   Cellulitis of left buttock   Chronic constipation   Severe malnutrition  Discharge Condition: stable  Diet recommendation: heart healthy  Filed Weights   03/19/17 0448 03/22/17 1325  Weight: 44.9 kg (99 lb) 44.9 kg (99 lb)    History of present illness:  83 year old female with history of hypertension and crest syndrome, debility, wheelchair bound, from a local assisted living facility was admitted 8/22 with fever or chills weakness and increased pain at the site of her sacral decubitus ulcer that she has had for about a year. CT scan without evidence of deep abscess or osteomyelitis  Hospital Course:  Wound infection/secondary infection of sacral decubitus ulcer with eschar/ necrotic appearing tissue -Treated with broad-spectrum antibiotics, Blood culture with 1/2 Coag negative staph c/w contaminant -General surgery consult appreciated, s/p bedside debridement on 8/23 -CT scan without evidence of osteomyelitis or abscess -improving, may need more debridement per [facility]-recommended FU with [facility] in 1 week with Doc of the week -treated with broad spectrum ABx, now changed to PO DOxycycline for 3 more days -ambulated with PT, eval completed home health recommended    Fecal impaction /acute on chronic  constipation -severe impaction, improved after enema and laxatives -resume miralax and add scheduled senokot    Hypertension -Stable, resumed amlodipine    History of CREST syndrome -Continue eyedrops, soft diet per home regimen   Debility/severe malnutrition -advised Ensure BID/TID  Procedures:  Bedside debridement 8/23  Consultations:  [facility] Dr.Newman and [place]  Discharge Exam: Vitals:   03/23/17 2008 03/24/17 0523  BP: 122/69 138/69  Pulse: 88 86  Resp: 16 16  Temp: 98.5 F (36.9 C) 98.5 F (36.9 C)  SpO2: 96% 96%    General: AAOx3 Cardiovascular: S1S2/RRR Respiratory: CTAB  Discharge Instructions   Discharge Instructions    Diet - low sodium heart healthy    Complete by:  As directed    Increase activity slowly    Complete by:  As directed      Current Discharge Medication List    START taking these medications   Details  doxycycline (VIBRA-TABS) 100 MG tablet Take 1 tablet (100 mg total) by mouth every 12 (twelve) hours. For 3days Qty: 6 tablet, Refills: 0    senna-docusate (SENOKOT-S) 8.6-50 MG tablet Take 1 tablet by mouth 2 (two) times daily. Qty: 30 tablet, Refills: 0      CONTINUE these medications which have NOT CHANGED   Details  acetaminophen (TYLENOL) 500 MG tablet Take 500 mg by mouth every 6 (six) hours as needed for moderate pain.    alendronate (FOSAMAX) 70 MG tablet Take 70 mg by mouth once a week. Take with a full glass of water on an empty stomach on Fridays.    amLODipine (NORVASC) 2.5 MG tablet Take 2.5 mg by mouth daily.    cholecalciferol (VITAMIN D) 1000  UNITS tablet Take 1,000 Units by mouth daily.    Cranberry 450 MG CAPS Take 1 capsule by mouth 2 (two) times daily.    fluticasone (FLONASE) 50 MCG/ACT nasal spray Place 2 sprays into both nostrils daily.    loratadine (CLARITIN) 10 MG tablet Take 10 mg by mouth daily as needed for allergies.     polyethylene glycol (MIRALAX / GLYCOLAX) packet Take 17 g by mouth  daily as needed for mild constipation.    Propylene Glycol (SYSTANE BALANCE) 0.6 % SOLN Apply 1 drop to eye 4 (four) times daily.    psyllium (KONSYL) 0.52 G capsule Take 0.52 g by mouth daily.    zolpidem (AMBIEN) 10 MG tablet Take 10 mg by mouth at bedtime.       STOP taking these medications     bisacodyl (DULCOLAX) 5 MG EC tablet      amoxicillin (AMOXIL) 500 MG capsule        No Known Allergies Follow-up Information    Leeroy Cha, MD. Schedule an appointment as soon as possible for a visit in 1 week(s).   Specialty:  Internal Medicine Contact information: [address] [address] [facility], [place] Follow up.   Specialty:  General Surgery Why:  Needs Follow Up in 1 week with [facility] with Doc of the Week for possibly additional debridement Contact information: [address] [place] [phone]           The results of significant diagnostics from this hospitalization (including imaging, microbiology, ancillary and laboratory) are listed below for reference.    Significant Diagnostic Studies: Ct Abdomen Pelvis W Contrast  Result Date: 03/19/2017 CLINICAL DATA:  Worsening decubitus ulcer over the past year. Fever and foul odor. EXAM: CT ABDOMEN AND PELVIS WITH CONTRAST TECHNIQUE: Multidetector CT imaging of the abdomen and pelvis was performed using the standard protocol following bolus administration of intravenous contrast. CONTRAST:  80 cc Isovue-300 IV COMPARISON:  None. FINDINGS: Lower chest: Motion artifact the lung bases. Probable atelectasis no dependent right lower lobe. Hepatobiliary: Postcholecystectomy with clips in the gallbladder fossa. There is intra and extrahepatic biliary ductal dilatation, common bile duct measures approximately 10 mm distally. Probable cysts in the inferior right lobe of the liver. Pancreas: Mild pancreatic ductal  dilatation measuring 5 mm. Parenchymal atrophy. No peripancreatic inflammation. No discrete lesion is seen. Spleen: Normal in size without focal abnormality. Adrenals/Urinary Tract: No adrenal nodule. Staghorn 3.1 cm calculus in the left renal pelvis with chronic hydronephrosis and marked renal parenchymal atrophy. Additional nonobstructing stones scattered throughout the left kidney. Dilatation of right renal pelvis with transition at the ureteropelvic junction. No right perinephric edema. Simple cyst in the lower right kidney. Urinary bladder is displaced anteriorly due to large stool ball in the rectum. Stomach/Bowel: Lack of enteric contrast and paucity intra-abdominal fat limits assessment. No bowel dilatation to suggest obstruction. Colonic tortuosity with moderate colonic stool burden. Large stool ball distends the distal sigmoid colon and rectum spinning 10.3 cm. Mild associated ictal wall thickening. The appendix is tentatively identified and normal. Vascular/Lymphatic: Aortic atherosclerosis without aneurysm. Limited assessment for adenopathy given paucity of intra-abdominal fat and lack of enteric contrast, no bulky adenopathy is seen. Reproductive: Status post hysterectomy. No adnexal masses. Other: No ascites or free air. Musculoskeletal: Skin thickening with associated defect/ ulcer posterior to the left aspect of the sacrum. Surrounding edematous changes with no discrete  fluid collection. No periosteal reaction or bony destructive change of the underlying sacrum to suggest osteomyelitis. There is degenerative change in the spine. Remote posterior left twelfth rib fracture. IMPRESSION: 1. Skin thickening and subcutaneous edema with soft tissue ulcer overlying the left sacrum. No abscess or CT findings of osteomyelitis. 2. Large stool ball distending the rectosigmoid colon with rectal wall thickening, consistent with fecal impaction. 3. Staghorn calculus in the left renal pelvis with chronic  hydronephrosis and renal parenchymal atrophy. 4. Postcholecystectomy with intra and extrahepatic biliary ductal dilatation. Recommend correlation with LFTs, if elevated consider further evaluation with MRCP, ERCP or EUS. Additionally there is mild pancreatic ductal dilatation. Pancreatic head mass not excluded, but not seen on CT. 5. Aortic atherosclerosis. Electronically Signed   By: Jeb Levering M.D.   On: 03/19/2017 06:39   Dg Chest Port 1 View  Result Date: 03/19/2017 CLINICAL DATA:  New onset fever.  Cough. EXAM: PORTABLE CHEST 1 VIEW COMPARISON:  Chest radiograph 11/21/2014 FINDINGS: Left basilar atelectasis. No focal consolidation. Unchanged appearance of the cardio mediastinum. No pleural effusion or pneumothorax. IMPRESSION: Left basilar atelectasis. Electronically Signed   By: Ulyses Jarred M.D.   On: 03/19/2017 04:51    Microbiology: Recent Results (from the past 240 hour(s))  Blood Culture (routine x 2)     Status: Abnormal   Collection Time: 03/19/17  4:24 AM  Result Value Ref Range Status   Specimen Description BLOOD RIGHT ANTECUBITAL  Final   Special Requests   Final    BOTTLES DRAWN AEROBIC AND ANAEROBIC Blood Culture adequate volume   Culture  Setup Time   Final    GRAM POSITIVE COCCI IN CLUSTERS IN BOTH AEROBIC AND ANAEROBIC BOTTLES CRITICAL RESULT CALLED TO, READ BACK BY AND VERIFIED WITH: M RELZ 03/20/17 @ 1610 M VESTAL    Culture (A)  Final    STAPHYLOCOCCUS SPECIES (COAGULASE NEGATIVE) THE SIGNIFICANCE OF ISOLATING THIS ORGANISM FROM A SINGLE SET OF BLOOD CULTURES WHEN MULTIPLE SETS ARE DRAWN IS UNCERTAIN. PLEASE NOTIFY THE MICROBIOLOGY DEPARTMENT WITHIN ONE WEEK IF SPECIATION AND SENSITIVITIES ARE REQUIRED. Performed at [facility], [place] [address] [place], [place] [address]    Report Status 03/22/2017 FINAL  Final  Blood Culture (routine x 2)     Status: None (Preliminary result)   Collection Time: 03/19/17  4:24 AM  Result Value Ref Range Status    Specimen Description BLOOD RIGHT WRIST  Final   Special Requests   Final    BOTTLES DRAWN AEROBIC AND ANAEROBIC Blood Culture results may not be optimal due to an inadequate volume of blood received in culture bottles   Culture   Final    NO GROWTH 4 DAYS Performed at [facility], [place] [address] [place], [place] [address]    Report Status PENDING  Incomplete  Urine culture     Status: Abnormal   Collection Time: 03/19/17  4:24 AM  Result Value Ref Range Status   Specimen Description URINE, CATHETERIZED  Final   Special Requests NONE  Final   Culture (A)  Final    <10,000 COLONIES/mL INSIGNIFICANT GROWTH Performed at [place] [address], [place],  [address]    Report Status 03/20/2017 FINAL  Final  Blood Culture ID Panel (Reflexed)     Status: Abnormal   Collection Time: 03/19/17  4:24 AM  Result Value Ref Range Status   Enterococcus species NOT DETECTED NOT DETECTED Final   Vancomycin resistance NOT DETECTED NOT  DETECTED Final   Listeria monocytogenes NOT DETECTED NOT DETECTED Final   Staphylococcus species DETECTED (A) NOT DETECTED Final    Comment: CRITICAL RESULT CALLED TO, READ BACK BY AND VERIFIED WITH: M RELZ 03/20/17 @ 8841 M VESTAL    Staphylococcus aureus NOT DETECTED NOT DETECTED Final   Methicillin resistance DETECTED (A) NOT DETECTED Final    Comment: CRITICAL RESULT CALLED TO, READ BACK BY AND VERIFIED WITH: M RELZ 03/20/17 @ 6606 M VESTAL    Streptococcus species NOT DETECTED NOT DETECTED Final   Streptococcus agalactiae NOT DETECTED NOT DETECTED Final   Streptococcus pneumoniae NOT DETECTED NOT DETECTED Final   Streptococcus pyogenes NOT DETECTED NOT DETECTED Final   Acinetobacter baumannii NOT DETECTED NOT DETECTED Final   Enterobacteriaceae species NOT DETECTED NOT DETECTED Final   Enterobacter cloacae complex NOT DETECTED NOT DETECTED Final   Escherichia coli NOT DETECTED NOT DETECTED Final   Klebsiella oxytoca NOT DETECTED NOT  DETECTED Final   Klebsiella pneumoniae NOT DETECTED NOT DETECTED Final   Proteus species NOT DETECTED NOT DETECTED Final   Serratia marcescens NOT DETECTED NOT DETECTED Final   Carbapenem resistance NOT DETECTED NOT DETECTED Final   Haemophilus influenzae NOT DETECTED NOT DETECTED Final   Neisseria meningitidis NOT DETECTED NOT DETECTED Final   Pseudomonas aeruginosa NOT DETECTED NOT DETECTED Final   Candida albicans NOT DETECTED NOT DETECTED Final   Candida glabrata NOT DETECTED NOT DETECTED Final   Candida krusei NOT DETECTED NOT DETECTED Final   Candida parapsilosis NOT DETECTED NOT DETECTED Final   Candida tropicalis NOT DETECTED NOT DETECTED Final    Comment: Performed at [facility], [place]. [address] [place], [place] [address]  Anaerobic culture     Status: None (Preliminary result)   Collection Time: 03/20/17 11:12 AM  Result Value Ref Range Status   Specimen Description WOUND ULCER  Final   Special Requests NONE  Final   Culture   Final    HOLDING FOR POSSIBLE ANAEROBE Performed at [facility], [place] [address], [place], [place] [address]    Report Status PENDING  Incomplete     Labs: Basic Metabolic Panel:  Recent Labs Lab 03/19/17 0424  03/20/17 0909 03/21/17 0626 03/22/17 0559 03/23/17 0603 03/24/17 0520  NA 139  --  139 139 137  --  138  K 4.2  --  3.6 3.2* 3.2*  --  3.5  CL 102  --  105 104 100*  --  101  CO2 30  --  30 29 31   --  29  GLUCOSE 92  --  113* 115* 121*  --  77  BUN 24*  --  12 11 11   --  17  CREATININE 0.82  < > 0.69 0.67 0.76 0.79 0.79  CALCIUM 8.3*  --  8.2* 8.2* 8.2*  --  8.5*  < > = values in this interval not displayed. Liver Function Tests:  Recent Labs Lab 03/19/17 0424  AST 22  ALT 10*  ALKPHOS 48  BILITOT 1.2  PROT 6.3*  ALBUMIN 3.0*   No results for input(s): LIPASE, AMYLASE in the last 168 hours. No results for input(s): AMMONIA in the last 168 hours. CBC:  Recent Labs Lab 03/19/17 0424 03/19/17 1250  03/20/17 0909 03/21/17 0626 03/22/17 0559 03/24/17 0520  WBC 15.8* 11.6* 8.7 7.0 6.5 7.2  NEUTROABS 13.2*  --   --   --   --   --   HGB 10.8* 10.9* 10.8* 10.7*  11.1* 11.1*  HCT 33.9* 34.0* 33.6* 33.4* 34.2* 34.9*  MCV 84.5 83.7 84.6 82.9 83.6 83.5  PLT 355 306 282 295 326 337   Cardiac Enzymes: No results for input(s): CKTOTAL, CKMB, CKMBINDEX, TROPONINI in the last 168 hours. BNP: BNP (last 3 results) No results for input(s): BNP in the last 8760 hours.  ProBNP (last 3 results) No results for input(s): PROBNP in the last 8760 hours.  CBG: No results for input(s): GLUCAP in the last 168 hours.     SignedDomenic Polite MD.  [facility] 03/24/2017, 11:31 AM

## 2017-03-26 DIAGNOSIS — M341 CR(E)ST syndrome: Secondary | ICD-10-CM | POA: Diagnosis not present

## 2017-03-26 DIAGNOSIS — L89322 Pressure ulcer of left buttock, stage 2: Secondary | ICD-10-CM | POA: Diagnosis not present

## 2017-03-26 DIAGNOSIS — Z4800 Encounter for change or removal of nonsurgical wound dressing: Secondary | ICD-10-CM | POA: Diagnosis not present

## 2017-03-26 DIAGNOSIS — N079 Hereditary nephropathy, not elsewhere classified with unspecified morphologic lesions: Secondary | ICD-10-CM | POA: Diagnosis not present

## 2017-03-26 DIAGNOSIS — B9562 Methicillin resistant Staphylococcus aureus infection as the cause of diseases classified elsewhere: Secondary | ICD-10-CM | POA: Diagnosis not present

## 2017-03-26 DIAGNOSIS — R531 Weakness: Secondary | ICD-10-CM | POA: Diagnosis not present

## 2017-03-26 DIAGNOSIS — Z993 Dependence on wheelchair: Secondary | ICD-10-CM | POA: Diagnosis not present

## 2017-03-26 DIAGNOSIS — Z87440 Personal history of urinary (tract) infections: Secondary | ICD-10-CM | POA: Diagnosis not present

## 2017-03-26 DIAGNOSIS — K5909 Other constipation: Secondary | ICD-10-CM | POA: Diagnosis not present

## 2017-03-26 DIAGNOSIS — Z9181 History of falling: Secondary | ICD-10-CM | POA: Diagnosis not present

## 2017-03-26 DIAGNOSIS — I10 Essential (primary) hypertension: Secondary | ICD-10-CM | POA: Diagnosis not present

## 2017-03-26 DIAGNOSIS — L03317 Cellulitis of buttock: Secondary | ICD-10-CM | POA: Diagnosis not present

## 2017-03-26 DIAGNOSIS — E43 Unspecified severe protein-calorie malnutrition: Secondary | ICD-10-CM | POA: Diagnosis not present

## 2017-03-26 DIAGNOSIS — M810 Age-related osteoporosis without current pathological fracture: Secondary | ICD-10-CM | POA: Diagnosis not present

## 2017-03-26 LAB — ANAEROBIC CULTURE

## 2017-03-27 DIAGNOSIS — I10 Essential (primary) hypertension: Secondary | ICD-10-CM | POA: Diagnosis not present

## 2017-03-27 DIAGNOSIS — R2681 Unsteadiness on feet: Secondary | ICD-10-CM | POA: Diagnosis not present

## 2017-03-27 DIAGNOSIS — G5693 Unspecified mononeuropathy of bilateral upper limbs: Secondary | ICD-10-CM | POA: Diagnosis not present

## 2017-03-27 DIAGNOSIS — L89152 Pressure ulcer of sacral region, stage 2: Secondary | ICD-10-CM | POA: Diagnosis not present

## 2017-03-28 DIAGNOSIS — R531 Weakness: Secondary | ICD-10-CM | POA: Diagnosis not present

## 2017-03-28 DIAGNOSIS — K5909 Other constipation: Secondary | ICD-10-CM | POA: Diagnosis not present

## 2017-03-28 DIAGNOSIS — Z9181 History of falling: Secondary | ICD-10-CM | POA: Diagnosis not present

## 2017-03-28 DIAGNOSIS — L89322 Pressure ulcer of left buttock, stage 2: Secondary | ICD-10-CM | POA: Diagnosis not present

## 2017-03-28 DIAGNOSIS — B9562 Methicillin resistant Staphylococcus aureus infection as the cause of diseases classified elsewhere: Secondary | ICD-10-CM | POA: Diagnosis not present

## 2017-03-28 DIAGNOSIS — L03317 Cellulitis of buttock: Secondary | ICD-10-CM | POA: Diagnosis not present

## 2017-03-30 DIAGNOSIS — R531 Weakness: Secondary | ICD-10-CM | POA: Diagnosis not present

## 2017-03-30 DIAGNOSIS — B9562 Methicillin resistant Staphylococcus aureus infection as the cause of diseases classified elsewhere: Secondary | ICD-10-CM | POA: Diagnosis not present

## 2017-03-30 DIAGNOSIS — Z9181 History of falling: Secondary | ICD-10-CM | POA: Diagnosis not present

## 2017-03-30 DIAGNOSIS — L89322 Pressure ulcer of left buttock, stage 2: Secondary | ICD-10-CM | POA: Diagnosis not present

## 2017-03-30 DIAGNOSIS — K5909 Other constipation: Secondary | ICD-10-CM | POA: Diagnosis not present

## 2017-03-30 DIAGNOSIS — L03317 Cellulitis of buttock: Secondary | ICD-10-CM | POA: Diagnosis not present

## 2017-04-01 DIAGNOSIS — Z9181 History of falling: Secondary | ICD-10-CM | POA: Diagnosis not present

## 2017-04-01 DIAGNOSIS — L03317 Cellulitis of buttock: Secondary | ICD-10-CM | POA: Diagnosis not present

## 2017-04-01 DIAGNOSIS — B9562 Methicillin resistant Staphylococcus aureus infection as the cause of diseases classified elsewhere: Secondary | ICD-10-CM | POA: Diagnosis not present

## 2017-04-01 DIAGNOSIS — K5909 Other constipation: Secondary | ICD-10-CM | POA: Diagnosis not present

## 2017-04-01 DIAGNOSIS — L89322 Pressure ulcer of left buttock, stage 2: Secondary | ICD-10-CM | POA: Diagnosis not present

## 2017-04-01 DIAGNOSIS — R531 Weakness: Secondary | ICD-10-CM | POA: Diagnosis not present

## 2017-04-02 DIAGNOSIS — L89322 Pressure ulcer of left buttock, stage 2: Secondary | ICD-10-CM | POA: Diagnosis not present

## 2017-04-02 DIAGNOSIS — B9562 Methicillin resistant Staphylococcus aureus infection as the cause of diseases classified elsewhere: Secondary | ICD-10-CM | POA: Diagnosis not present

## 2017-04-02 DIAGNOSIS — Z9181 History of falling: Secondary | ICD-10-CM | POA: Diagnosis not present

## 2017-04-02 DIAGNOSIS — R531 Weakness: Secondary | ICD-10-CM | POA: Diagnosis not present

## 2017-04-02 DIAGNOSIS — L03317 Cellulitis of buttock: Secondary | ICD-10-CM | POA: Diagnosis not present

## 2017-04-02 DIAGNOSIS — K5909 Other constipation: Secondary | ICD-10-CM | POA: Diagnosis not present

## 2017-04-03 DIAGNOSIS — R531 Weakness: Secondary | ICD-10-CM | POA: Diagnosis not present

## 2017-04-03 DIAGNOSIS — L03317 Cellulitis of buttock: Secondary | ICD-10-CM | POA: Diagnosis not present

## 2017-04-03 DIAGNOSIS — B9562 Methicillin resistant Staphylococcus aureus infection as the cause of diseases classified elsewhere: Secondary | ICD-10-CM | POA: Diagnosis not present

## 2017-04-03 DIAGNOSIS — L89322 Pressure ulcer of left buttock, stage 2: Secondary | ICD-10-CM | POA: Diagnosis not present

## 2017-04-03 DIAGNOSIS — K5909 Other constipation: Secondary | ICD-10-CM | POA: Diagnosis not present

## 2017-04-03 DIAGNOSIS — Z9181 History of falling: Secondary | ICD-10-CM | POA: Diagnosis not present

## 2017-04-04 DIAGNOSIS — Z9181 History of falling: Secondary | ICD-10-CM | POA: Diagnosis not present

## 2017-04-04 DIAGNOSIS — R531 Weakness: Secondary | ICD-10-CM | POA: Diagnosis not present

## 2017-04-04 DIAGNOSIS — K5909 Other constipation: Secondary | ICD-10-CM | POA: Diagnosis not present

## 2017-04-04 DIAGNOSIS — B9562 Methicillin resistant Staphylococcus aureus infection as the cause of diseases classified elsewhere: Secondary | ICD-10-CM | POA: Diagnosis not present

## 2017-04-04 DIAGNOSIS — L03317 Cellulitis of buttock: Secondary | ICD-10-CM | POA: Diagnosis not present

## 2017-04-04 DIAGNOSIS — L89322 Pressure ulcer of left buttock, stage 2: Secondary | ICD-10-CM | POA: Diagnosis not present

## 2017-04-05 DIAGNOSIS — Z9181 History of falling: Secondary | ICD-10-CM | POA: Diagnosis not present

## 2017-04-05 DIAGNOSIS — B9562 Methicillin resistant Staphylococcus aureus infection as the cause of diseases classified elsewhere: Secondary | ICD-10-CM | POA: Diagnosis not present

## 2017-04-05 DIAGNOSIS — L89322 Pressure ulcer of left buttock, stage 2: Secondary | ICD-10-CM | POA: Diagnosis not present

## 2017-04-05 DIAGNOSIS — L03317 Cellulitis of buttock: Secondary | ICD-10-CM | POA: Diagnosis not present

## 2017-04-05 DIAGNOSIS — R531 Weakness: Secondary | ICD-10-CM | POA: Diagnosis not present

## 2017-04-05 DIAGNOSIS — K5909 Other constipation: Secondary | ICD-10-CM | POA: Diagnosis not present

## 2017-04-06 DIAGNOSIS — B9562 Methicillin resistant Staphylococcus aureus infection as the cause of diseases classified elsewhere: Secondary | ICD-10-CM | POA: Diagnosis not present

## 2017-04-06 DIAGNOSIS — L89322 Pressure ulcer of left buttock, stage 2: Secondary | ICD-10-CM | POA: Diagnosis not present

## 2017-04-06 DIAGNOSIS — R531 Weakness: Secondary | ICD-10-CM | POA: Diagnosis not present

## 2017-04-06 DIAGNOSIS — L03317 Cellulitis of buttock: Secondary | ICD-10-CM | POA: Diagnosis not present

## 2017-04-06 DIAGNOSIS — K5909 Other constipation: Secondary | ICD-10-CM | POA: Diagnosis not present

## 2017-04-06 DIAGNOSIS — Z9181 History of falling: Secondary | ICD-10-CM | POA: Diagnosis not present

## 2017-04-07 DIAGNOSIS — Z9181 History of falling: Secondary | ICD-10-CM | POA: Diagnosis not present

## 2017-04-07 DIAGNOSIS — R531 Weakness: Secondary | ICD-10-CM | POA: Diagnosis not present

## 2017-04-07 DIAGNOSIS — L89322 Pressure ulcer of left buttock, stage 2: Secondary | ICD-10-CM | POA: Diagnosis not present

## 2017-04-07 DIAGNOSIS — K5909 Other constipation: Secondary | ICD-10-CM | POA: Diagnosis not present

## 2017-04-07 DIAGNOSIS — L03317 Cellulitis of buttock: Secondary | ICD-10-CM | POA: Diagnosis not present

## 2017-04-07 DIAGNOSIS — B9562 Methicillin resistant Staphylococcus aureus infection as the cause of diseases classified elsewhere: Secondary | ICD-10-CM | POA: Diagnosis not present

## 2017-04-08 DIAGNOSIS — B9562 Methicillin resistant Staphylococcus aureus infection as the cause of diseases classified elsewhere: Secondary | ICD-10-CM | POA: Diagnosis not present

## 2017-04-08 DIAGNOSIS — K5909 Other constipation: Secondary | ICD-10-CM | POA: Diagnosis not present

## 2017-04-08 DIAGNOSIS — L03317 Cellulitis of buttock: Secondary | ICD-10-CM | POA: Diagnosis not present

## 2017-04-08 DIAGNOSIS — R531 Weakness: Secondary | ICD-10-CM | POA: Diagnosis not present

## 2017-04-08 DIAGNOSIS — Z9181 History of falling: Secondary | ICD-10-CM | POA: Diagnosis not present

## 2017-04-08 DIAGNOSIS — L89322 Pressure ulcer of left buttock, stage 2: Secondary | ICD-10-CM | POA: Diagnosis not present

## 2017-04-09 DIAGNOSIS — K5909 Other constipation: Secondary | ICD-10-CM | POA: Diagnosis not present

## 2017-04-09 DIAGNOSIS — R531 Weakness: Secondary | ICD-10-CM | POA: Diagnosis not present

## 2017-04-09 DIAGNOSIS — L89322 Pressure ulcer of left buttock, stage 2: Secondary | ICD-10-CM | POA: Diagnosis not present

## 2017-04-09 DIAGNOSIS — L03317 Cellulitis of buttock: Secondary | ICD-10-CM | POA: Diagnosis not present

## 2017-04-09 DIAGNOSIS — Z9181 History of falling: Secondary | ICD-10-CM | POA: Diagnosis not present

## 2017-04-09 DIAGNOSIS — B9562 Methicillin resistant Staphylococcus aureus infection as the cause of diseases classified elsewhere: Secondary | ICD-10-CM | POA: Diagnosis not present

## 2017-04-10 DIAGNOSIS — L89322 Pressure ulcer of left buttock, stage 2: Secondary | ICD-10-CM | POA: Diagnosis not present

## 2017-04-10 DIAGNOSIS — R531 Weakness: Secondary | ICD-10-CM | POA: Diagnosis not present

## 2017-04-10 DIAGNOSIS — Z9181 History of falling: Secondary | ICD-10-CM | POA: Diagnosis not present

## 2017-04-10 DIAGNOSIS — L03317 Cellulitis of buttock: Secondary | ICD-10-CM | POA: Diagnosis not present

## 2017-04-10 DIAGNOSIS — K5909 Other constipation: Secondary | ICD-10-CM | POA: Diagnosis not present

## 2017-04-10 DIAGNOSIS — B9562 Methicillin resistant Staphylococcus aureus infection as the cause of diseases classified elsewhere: Secondary | ICD-10-CM | POA: Diagnosis not present

## 2017-04-11 DIAGNOSIS — B9562 Methicillin resistant Staphylococcus aureus infection as the cause of diseases classified elsewhere: Secondary | ICD-10-CM | POA: Diagnosis not present

## 2017-04-11 DIAGNOSIS — L03317 Cellulitis of buttock: Secondary | ICD-10-CM | POA: Diagnosis not present

## 2017-04-11 DIAGNOSIS — R531 Weakness: Secondary | ICD-10-CM | POA: Diagnosis not present

## 2017-04-11 DIAGNOSIS — L89322 Pressure ulcer of left buttock, stage 2: Secondary | ICD-10-CM | POA: Diagnosis not present

## 2017-04-11 DIAGNOSIS — K5909 Other constipation: Secondary | ICD-10-CM | POA: Diagnosis not present

## 2017-04-11 DIAGNOSIS — Z9181 History of falling: Secondary | ICD-10-CM | POA: Diagnosis not present

## 2017-04-12 DIAGNOSIS — L89322 Pressure ulcer of left buttock, stage 2: Secondary | ICD-10-CM | POA: Diagnosis not present

## 2017-04-12 DIAGNOSIS — Z9181 History of falling: Secondary | ICD-10-CM | POA: Diagnosis not present

## 2017-04-12 DIAGNOSIS — L03317 Cellulitis of buttock: Secondary | ICD-10-CM | POA: Diagnosis not present

## 2017-04-12 DIAGNOSIS — K5909 Other constipation: Secondary | ICD-10-CM | POA: Diagnosis not present

## 2017-04-12 DIAGNOSIS — B9562 Methicillin resistant Staphylococcus aureus infection as the cause of diseases classified elsewhere: Secondary | ICD-10-CM | POA: Diagnosis not present

## 2017-04-12 DIAGNOSIS — R531 Weakness: Secondary | ICD-10-CM | POA: Diagnosis not present

## 2017-04-13 DIAGNOSIS — B9562 Methicillin resistant Staphylococcus aureus infection as the cause of diseases classified elsewhere: Secondary | ICD-10-CM | POA: Diagnosis not present

## 2017-04-13 DIAGNOSIS — L89322 Pressure ulcer of left buttock, stage 2: Secondary | ICD-10-CM | POA: Diagnosis not present

## 2017-04-13 DIAGNOSIS — R531 Weakness: Secondary | ICD-10-CM | POA: Diagnosis not present

## 2017-04-13 DIAGNOSIS — K5909 Other constipation: Secondary | ICD-10-CM | POA: Diagnosis not present

## 2017-04-13 DIAGNOSIS — Z9181 History of falling: Secondary | ICD-10-CM | POA: Diagnosis not present

## 2017-04-13 DIAGNOSIS — L03317 Cellulitis of buttock: Secondary | ICD-10-CM | POA: Diagnosis not present

## 2017-04-14 DIAGNOSIS — Z9181 History of falling: Secondary | ICD-10-CM | POA: Diagnosis not present

## 2017-04-14 DIAGNOSIS — K5909 Other constipation: Secondary | ICD-10-CM | POA: Diagnosis not present

## 2017-04-14 DIAGNOSIS — L89322 Pressure ulcer of left buttock, stage 2: Secondary | ICD-10-CM | POA: Diagnosis not present

## 2017-04-14 DIAGNOSIS — R531 Weakness: Secondary | ICD-10-CM | POA: Diagnosis not present

## 2017-04-14 DIAGNOSIS — B9562 Methicillin resistant Staphylococcus aureus infection as the cause of diseases classified elsewhere: Secondary | ICD-10-CM | POA: Diagnosis not present

## 2017-04-14 DIAGNOSIS — L03317 Cellulitis of buttock: Secondary | ICD-10-CM | POA: Diagnosis not present

## 2017-04-15 DIAGNOSIS — B9562 Methicillin resistant Staphylococcus aureus infection as the cause of diseases classified elsewhere: Secondary | ICD-10-CM | POA: Diagnosis not present

## 2017-04-15 DIAGNOSIS — K5909 Other constipation: Secondary | ICD-10-CM | POA: Diagnosis not present

## 2017-04-15 DIAGNOSIS — L03317 Cellulitis of buttock: Secondary | ICD-10-CM | POA: Diagnosis not present

## 2017-04-15 DIAGNOSIS — R531 Weakness: Secondary | ICD-10-CM | POA: Diagnosis not present

## 2017-04-15 DIAGNOSIS — Z9181 History of falling: Secondary | ICD-10-CM | POA: Diagnosis not present

## 2017-04-15 DIAGNOSIS — L89322 Pressure ulcer of left buttock, stage 2: Secondary | ICD-10-CM | POA: Diagnosis not present

## 2017-04-16 DIAGNOSIS — L03317 Cellulitis of buttock: Secondary | ICD-10-CM | POA: Diagnosis not present

## 2017-04-16 DIAGNOSIS — Z9181 History of falling: Secondary | ICD-10-CM | POA: Diagnosis not present

## 2017-04-16 DIAGNOSIS — L89322 Pressure ulcer of left buttock, stage 2: Secondary | ICD-10-CM | POA: Diagnosis not present

## 2017-04-16 DIAGNOSIS — B9562 Methicillin resistant Staphylococcus aureus infection as the cause of diseases classified elsewhere: Secondary | ICD-10-CM | POA: Diagnosis not present

## 2017-04-16 DIAGNOSIS — K5909 Other constipation: Secondary | ICD-10-CM | POA: Diagnosis not present

## 2017-04-16 DIAGNOSIS — R531 Weakness: Secondary | ICD-10-CM | POA: Diagnosis not present

## 2017-04-17 DIAGNOSIS — B9562 Methicillin resistant Staphylococcus aureus infection as the cause of diseases classified elsewhere: Secondary | ICD-10-CM | POA: Diagnosis not present

## 2017-04-17 DIAGNOSIS — L89322 Pressure ulcer of left buttock, stage 2: Secondary | ICD-10-CM | POA: Diagnosis not present

## 2017-04-17 DIAGNOSIS — Z9181 History of falling: Secondary | ICD-10-CM | POA: Diagnosis not present

## 2017-04-17 DIAGNOSIS — K5909 Other constipation: Secondary | ICD-10-CM | POA: Diagnosis not present

## 2017-04-17 DIAGNOSIS — R531 Weakness: Secondary | ICD-10-CM | POA: Diagnosis not present

## 2017-04-17 DIAGNOSIS — L03317 Cellulitis of buttock: Secondary | ICD-10-CM | POA: Diagnosis not present

## 2017-04-18 DIAGNOSIS — R531 Weakness: Secondary | ICD-10-CM | POA: Diagnosis not present

## 2017-04-18 DIAGNOSIS — L89322 Pressure ulcer of left buttock, stage 2: Secondary | ICD-10-CM | POA: Diagnosis not present

## 2017-04-18 DIAGNOSIS — Z9181 History of falling: Secondary | ICD-10-CM | POA: Diagnosis not present

## 2017-04-18 DIAGNOSIS — K5909 Other constipation: Secondary | ICD-10-CM | POA: Diagnosis not present

## 2017-04-18 DIAGNOSIS — B9562 Methicillin resistant Staphylococcus aureus infection as the cause of diseases classified elsewhere: Secondary | ICD-10-CM | POA: Diagnosis not present

## 2017-04-18 DIAGNOSIS — L03317 Cellulitis of buttock: Secondary | ICD-10-CM | POA: Diagnosis not present

## 2017-04-19 DIAGNOSIS — L89322 Pressure ulcer of left buttock, stage 2: Secondary | ICD-10-CM | POA: Diagnosis not present

## 2017-04-19 DIAGNOSIS — Z9181 History of falling: Secondary | ICD-10-CM | POA: Diagnosis not present

## 2017-04-19 DIAGNOSIS — K5909 Other constipation: Secondary | ICD-10-CM | POA: Diagnosis not present

## 2017-04-19 DIAGNOSIS — B9562 Methicillin resistant Staphylococcus aureus infection as the cause of diseases classified elsewhere: Secondary | ICD-10-CM | POA: Diagnosis not present

## 2017-04-19 DIAGNOSIS — L03317 Cellulitis of buttock: Secondary | ICD-10-CM | POA: Diagnosis not present

## 2017-04-19 DIAGNOSIS — R531 Weakness: Secondary | ICD-10-CM | POA: Diagnosis not present

## 2017-04-20 DIAGNOSIS — L89322 Pressure ulcer of left buttock, stage 2: Secondary | ICD-10-CM | POA: Diagnosis not present

## 2017-04-20 DIAGNOSIS — L03317 Cellulitis of buttock: Secondary | ICD-10-CM | POA: Diagnosis not present

## 2017-04-20 DIAGNOSIS — K5909 Other constipation: Secondary | ICD-10-CM | POA: Diagnosis not present

## 2017-04-20 DIAGNOSIS — Z9181 History of falling: Secondary | ICD-10-CM | POA: Diagnosis not present

## 2017-04-20 DIAGNOSIS — B9562 Methicillin resistant Staphylococcus aureus infection as the cause of diseases classified elsewhere: Secondary | ICD-10-CM | POA: Diagnosis not present

## 2017-04-20 DIAGNOSIS — R531 Weakness: Secondary | ICD-10-CM | POA: Diagnosis not present

## 2017-04-21 DIAGNOSIS — Z9181 History of falling: Secondary | ICD-10-CM | POA: Diagnosis not present

## 2017-04-21 DIAGNOSIS — L03317 Cellulitis of buttock: Secondary | ICD-10-CM | POA: Diagnosis not present

## 2017-04-21 DIAGNOSIS — K5909 Other constipation: Secondary | ICD-10-CM | POA: Diagnosis not present

## 2017-04-21 DIAGNOSIS — L89322 Pressure ulcer of left buttock, stage 2: Secondary | ICD-10-CM | POA: Diagnosis not present

## 2017-04-21 DIAGNOSIS — B9562 Methicillin resistant Staphylococcus aureus infection as the cause of diseases classified elsewhere: Secondary | ICD-10-CM | POA: Diagnosis not present

## 2017-04-21 DIAGNOSIS — R531 Weakness: Secondary | ICD-10-CM | POA: Diagnosis not present

## 2017-04-22 DIAGNOSIS — L03317 Cellulitis of buttock: Secondary | ICD-10-CM | POA: Diagnosis not present

## 2017-04-22 DIAGNOSIS — Z9181 History of falling: Secondary | ICD-10-CM | POA: Diagnosis not present

## 2017-04-22 DIAGNOSIS — K5909 Other constipation: Secondary | ICD-10-CM | POA: Diagnosis not present

## 2017-04-22 DIAGNOSIS — L89322 Pressure ulcer of left buttock, stage 2: Secondary | ICD-10-CM | POA: Diagnosis not present

## 2017-04-22 DIAGNOSIS — R531 Weakness: Secondary | ICD-10-CM | POA: Diagnosis not present

## 2017-04-22 DIAGNOSIS — B9562 Methicillin resistant Staphylococcus aureus infection as the cause of diseases classified elsewhere: Secondary | ICD-10-CM | POA: Diagnosis not present

## 2017-04-23 DIAGNOSIS — L89322 Pressure ulcer of left buttock, stage 2: Secondary | ICD-10-CM | POA: Diagnosis not present

## 2017-04-23 DIAGNOSIS — R531 Weakness: Secondary | ICD-10-CM | POA: Diagnosis not present

## 2017-04-23 DIAGNOSIS — B9562 Methicillin resistant Staphylococcus aureus infection as the cause of diseases classified elsewhere: Secondary | ICD-10-CM | POA: Diagnosis not present

## 2017-04-23 DIAGNOSIS — L03317 Cellulitis of buttock: Secondary | ICD-10-CM | POA: Diagnosis not present

## 2017-04-23 DIAGNOSIS — K5909 Other constipation: Secondary | ICD-10-CM | POA: Diagnosis not present

## 2017-04-23 DIAGNOSIS — Z9181 History of falling: Secondary | ICD-10-CM | POA: Diagnosis not present

## 2017-04-24 DIAGNOSIS — Z9181 History of falling: Secondary | ICD-10-CM | POA: Diagnosis not present

## 2017-04-24 DIAGNOSIS — B9562 Methicillin resistant Staphylococcus aureus infection as the cause of diseases classified elsewhere: Secondary | ICD-10-CM | POA: Diagnosis not present

## 2017-04-24 DIAGNOSIS — R531 Weakness: Secondary | ICD-10-CM | POA: Diagnosis not present

## 2017-04-24 DIAGNOSIS — L89322 Pressure ulcer of left buttock, stage 2: Secondary | ICD-10-CM | POA: Diagnosis not present

## 2017-04-24 DIAGNOSIS — L03317 Cellulitis of buttock: Secondary | ICD-10-CM | POA: Diagnosis not present

## 2017-04-24 DIAGNOSIS — K5909 Other constipation: Secondary | ICD-10-CM | POA: Diagnosis not present

## 2017-04-25 DIAGNOSIS — Z9181 History of falling: Secondary | ICD-10-CM | POA: Diagnosis not present

## 2017-04-25 DIAGNOSIS — B9562 Methicillin resistant Staphylococcus aureus infection as the cause of diseases classified elsewhere: Secondary | ICD-10-CM | POA: Diagnosis not present

## 2017-04-25 DIAGNOSIS — L03317 Cellulitis of buttock: Secondary | ICD-10-CM | POA: Diagnosis not present

## 2017-04-25 DIAGNOSIS — K5909 Other constipation: Secondary | ICD-10-CM | POA: Diagnosis not present

## 2017-04-25 DIAGNOSIS — L89322 Pressure ulcer of left buttock, stage 2: Secondary | ICD-10-CM | POA: Diagnosis not present

## 2017-04-25 DIAGNOSIS — R531 Weakness: Secondary | ICD-10-CM | POA: Diagnosis not present

## 2017-04-26 DIAGNOSIS — L89322 Pressure ulcer of left buttock, stage 2: Secondary | ICD-10-CM | POA: Diagnosis not present

## 2017-04-26 DIAGNOSIS — B9562 Methicillin resistant Staphylococcus aureus infection as the cause of diseases classified elsewhere: Secondary | ICD-10-CM | POA: Diagnosis not present

## 2017-04-26 DIAGNOSIS — L03317 Cellulitis of buttock: Secondary | ICD-10-CM | POA: Diagnosis not present

## 2017-04-26 DIAGNOSIS — R531 Weakness: Secondary | ICD-10-CM | POA: Diagnosis not present

## 2017-04-26 DIAGNOSIS — Z9181 History of falling: Secondary | ICD-10-CM | POA: Diagnosis not present

## 2017-04-26 DIAGNOSIS — K5909 Other constipation: Secondary | ICD-10-CM | POA: Diagnosis not present

## 2017-04-27 DIAGNOSIS — R531 Weakness: Secondary | ICD-10-CM | POA: Diagnosis not present

## 2017-04-27 DIAGNOSIS — Z9181 History of falling: Secondary | ICD-10-CM | POA: Diagnosis not present

## 2017-04-27 DIAGNOSIS — K5909 Other constipation: Secondary | ICD-10-CM | POA: Diagnosis not present

## 2017-04-27 DIAGNOSIS — B9562 Methicillin resistant Staphylococcus aureus infection as the cause of diseases classified elsewhere: Secondary | ICD-10-CM | POA: Diagnosis not present

## 2017-04-27 DIAGNOSIS — L89322 Pressure ulcer of left buttock, stage 2: Secondary | ICD-10-CM | POA: Diagnosis not present

## 2017-04-27 DIAGNOSIS — L03317 Cellulitis of buttock: Secondary | ICD-10-CM | POA: Diagnosis not present

## 2017-04-28 DIAGNOSIS — R531 Weakness: Secondary | ICD-10-CM | POA: Diagnosis not present

## 2017-04-28 DIAGNOSIS — K5909 Other constipation: Secondary | ICD-10-CM | POA: Diagnosis not present

## 2017-04-28 DIAGNOSIS — B9562 Methicillin resistant Staphylococcus aureus infection as the cause of diseases classified elsewhere: Secondary | ICD-10-CM | POA: Diagnosis not present

## 2017-04-28 DIAGNOSIS — Z9181 History of falling: Secondary | ICD-10-CM | POA: Diagnosis not present

## 2017-04-28 DIAGNOSIS — L89322 Pressure ulcer of left buttock, stage 2: Secondary | ICD-10-CM | POA: Diagnosis not present

## 2017-04-28 DIAGNOSIS — L03317 Cellulitis of buttock: Secondary | ICD-10-CM | POA: Diagnosis not present

## 2017-04-29 DIAGNOSIS — R531 Weakness: Secondary | ICD-10-CM | POA: Diagnosis not present

## 2017-04-29 DIAGNOSIS — K5909 Other constipation: Secondary | ICD-10-CM | POA: Diagnosis not present

## 2017-04-29 DIAGNOSIS — L03317 Cellulitis of buttock: Secondary | ICD-10-CM | POA: Diagnosis not present

## 2017-04-29 DIAGNOSIS — B9562 Methicillin resistant Staphylococcus aureus infection as the cause of diseases classified elsewhere: Secondary | ICD-10-CM | POA: Diagnosis not present

## 2017-04-29 DIAGNOSIS — L89322 Pressure ulcer of left buttock, stage 2: Secondary | ICD-10-CM | POA: Diagnosis not present

## 2017-04-29 DIAGNOSIS — Z9181 History of falling: Secondary | ICD-10-CM | POA: Diagnosis not present

## 2017-04-30 DIAGNOSIS — L89322 Pressure ulcer of left buttock, stage 2: Secondary | ICD-10-CM | POA: Diagnosis not present

## 2017-04-30 DIAGNOSIS — Z23 Encounter for immunization: Secondary | ICD-10-CM | POA: Diagnosis not present

## 2017-04-30 DIAGNOSIS — Z9181 History of falling: Secondary | ICD-10-CM | POA: Diagnosis not present

## 2017-04-30 DIAGNOSIS — K5909 Other constipation: Secondary | ICD-10-CM | POA: Diagnosis not present

## 2017-04-30 DIAGNOSIS — L89152 Pressure ulcer of sacral region, stage 2: Secondary | ICD-10-CM | POA: Diagnosis not present

## 2017-04-30 DIAGNOSIS — I10 Essential (primary) hypertension: Secondary | ICD-10-CM | POA: Diagnosis not present

## 2017-04-30 DIAGNOSIS — B9562 Methicillin resistant Staphylococcus aureus infection as the cause of diseases classified elsewhere: Secondary | ICD-10-CM | POA: Diagnosis not present

## 2017-04-30 DIAGNOSIS — L03317 Cellulitis of buttock: Secondary | ICD-10-CM | POA: Diagnosis not present

## 2017-04-30 DIAGNOSIS — M341 CR(E)ST syndrome: Secondary | ICD-10-CM | POA: Diagnosis not present

## 2017-04-30 DIAGNOSIS — R531 Weakness: Secondary | ICD-10-CM | POA: Diagnosis not present

## 2017-04-30 DIAGNOSIS — F5101 Primary insomnia: Secondary | ICD-10-CM | POA: Diagnosis not present

## 2017-04-30 DIAGNOSIS — M810 Age-related osteoporosis without current pathological fracture: Secondary | ICD-10-CM | POA: Diagnosis not present

## 2017-05-01 DIAGNOSIS — L03317 Cellulitis of buttock: Secondary | ICD-10-CM | POA: Diagnosis not present

## 2017-05-01 DIAGNOSIS — K5909 Other constipation: Secondary | ICD-10-CM | POA: Diagnosis not present

## 2017-05-01 DIAGNOSIS — B9562 Methicillin resistant Staphylococcus aureus infection as the cause of diseases classified elsewhere: Secondary | ICD-10-CM | POA: Diagnosis not present

## 2017-05-01 DIAGNOSIS — L89322 Pressure ulcer of left buttock, stage 2: Secondary | ICD-10-CM | POA: Diagnosis not present

## 2017-05-01 DIAGNOSIS — Z9181 History of falling: Secondary | ICD-10-CM | POA: Diagnosis not present

## 2017-05-01 DIAGNOSIS — R531 Weakness: Secondary | ICD-10-CM | POA: Diagnosis not present

## 2017-05-02 DIAGNOSIS — B9562 Methicillin resistant Staphylococcus aureus infection as the cause of diseases classified elsewhere: Secondary | ICD-10-CM | POA: Diagnosis not present

## 2017-05-02 DIAGNOSIS — K5909 Other constipation: Secondary | ICD-10-CM | POA: Diagnosis not present

## 2017-05-02 DIAGNOSIS — R531 Weakness: Secondary | ICD-10-CM | POA: Diagnosis not present

## 2017-05-02 DIAGNOSIS — L89322 Pressure ulcer of left buttock, stage 2: Secondary | ICD-10-CM | POA: Diagnosis not present

## 2017-05-02 DIAGNOSIS — Z9181 History of falling: Secondary | ICD-10-CM | POA: Diagnosis not present

## 2017-05-02 DIAGNOSIS — L03317 Cellulitis of buttock: Secondary | ICD-10-CM | POA: Diagnosis not present

## 2017-05-03 DIAGNOSIS — L03317 Cellulitis of buttock: Secondary | ICD-10-CM | POA: Diagnosis not present

## 2017-05-03 DIAGNOSIS — K5909 Other constipation: Secondary | ICD-10-CM | POA: Diagnosis not present

## 2017-05-03 DIAGNOSIS — Z9181 History of falling: Secondary | ICD-10-CM | POA: Diagnosis not present

## 2017-05-03 DIAGNOSIS — L89322 Pressure ulcer of left buttock, stage 2: Secondary | ICD-10-CM | POA: Diagnosis not present

## 2017-05-03 DIAGNOSIS — B9562 Methicillin resistant Staphylococcus aureus infection as the cause of diseases classified elsewhere: Secondary | ICD-10-CM | POA: Diagnosis not present

## 2017-05-03 DIAGNOSIS — R531 Weakness: Secondary | ICD-10-CM | POA: Diagnosis not present

## 2017-05-04 DIAGNOSIS — L03317 Cellulitis of buttock: Secondary | ICD-10-CM | POA: Diagnosis not present

## 2017-05-04 DIAGNOSIS — K5909 Other constipation: Secondary | ICD-10-CM | POA: Diagnosis not present

## 2017-05-04 DIAGNOSIS — L89322 Pressure ulcer of left buttock, stage 2: Secondary | ICD-10-CM | POA: Diagnosis not present

## 2017-05-04 DIAGNOSIS — B9562 Methicillin resistant Staphylococcus aureus infection as the cause of diseases classified elsewhere: Secondary | ICD-10-CM | POA: Diagnosis not present

## 2017-05-04 DIAGNOSIS — Z9181 History of falling: Secondary | ICD-10-CM | POA: Diagnosis not present

## 2017-05-04 DIAGNOSIS — R531 Weakness: Secondary | ICD-10-CM | POA: Diagnosis not present

## 2017-05-05 DIAGNOSIS — R531 Weakness: Secondary | ICD-10-CM | POA: Diagnosis not present

## 2017-05-05 DIAGNOSIS — K5909 Other constipation: Secondary | ICD-10-CM | POA: Diagnosis not present

## 2017-05-05 DIAGNOSIS — L89322 Pressure ulcer of left buttock, stage 2: Secondary | ICD-10-CM | POA: Diagnosis not present

## 2017-05-05 DIAGNOSIS — L03317 Cellulitis of buttock: Secondary | ICD-10-CM | POA: Diagnosis not present

## 2017-05-05 DIAGNOSIS — Z9181 History of falling: Secondary | ICD-10-CM | POA: Diagnosis not present

## 2017-05-05 DIAGNOSIS — B9562 Methicillin resistant Staphylococcus aureus infection as the cause of diseases classified elsewhere: Secondary | ICD-10-CM | POA: Diagnosis not present

## 2017-05-06 DIAGNOSIS — L03317 Cellulitis of buttock: Secondary | ICD-10-CM | POA: Diagnosis not present

## 2017-05-06 DIAGNOSIS — K5909 Other constipation: Secondary | ICD-10-CM | POA: Diagnosis not present

## 2017-05-06 DIAGNOSIS — L89322 Pressure ulcer of left buttock, stage 2: Secondary | ICD-10-CM | POA: Diagnosis not present

## 2017-05-06 DIAGNOSIS — Z9181 History of falling: Secondary | ICD-10-CM | POA: Diagnosis not present

## 2017-05-06 DIAGNOSIS — R531 Weakness: Secondary | ICD-10-CM | POA: Diagnosis not present

## 2017-05-06 DIAGNOSIS — B9562 Methicillin resistant Staphylococcus aureus infection as the cause of diseases classified elsewhere: Secondary | ICD-10-CM | POA: Diagnosis not present

## 2017-05-07 DIAGNOSIS — R531 Weakness: Secondary | ICD-10-CM | POA: Diagnosis not present

## 2017-05-07 DIAGNOSIS — Z9181 History of falling: Secondary | ICD-10-CM | POA: Diagnosis not present

## 2017-05-07 DIAGNOSIS — K5909 Other constipation: Secondary | ICD-10-CM | POA: Diagnosis not present

## 2017-05-07 DIAGNOSIS — L89322 Pressure ulcer of left buttock, stage 2: Secondary | ICD-10-CM | POA: Diagnosis not present

## 2017-05-07 DIAGNOSIS — L03317 Cellulitis of buttock: Secondary | ICD-10-CM | POA: Diagnosis not present

## 2017-05-07 DIAGNOSIS — B9562 Methicillin resistant Staphylococcus aureus infection as the cause of diseases classified elsewhere: Secondary | ICD-10-CM | POA: Diagnosis not present

## 2017-05-08 DIAGNOSIS — B9562 Methicillin resistant Staphylococcus aureus infection as the cause of diseases classified elsewhere: Secondary | ICD-10-CM | POA: Diagnosis not present

## 2017-05-08 DIAGNOSIS — Z9181 History of falling: Secondary | ICD-10-CM | POA: Diagnosis not present

## 2017-05-08 DIAGNOSIS — R531 Weakness: Secondary | ICD-10-CM | POA: Diagnosis not present

## 2017-05-08 DIAGNOSIS — L89322 Pressure ulcer of left buttock, stage 2: Secondary | ICD-10-CM | POA: Diagnosis not present

## 2017-05-08 DIAGNOSIS — L03317 Cellulitis of buttock: Secondary | ICD-10-CM | POA: Diagnosis not present

## 2017-05-08 DIAGNOSIS — K5909 Other constipation: Secondary | ICD-10-CM | POA: Diagnosis not present

## 2017-05-09 DIAGNOSIS — R531 Weakness: Secondary | ICD-10-CM | POA: Diagnosis not present

## 2017-05-09 DIAGNOSIS — L03317 Cellulitis of buttock: Secondary | ICD-10-CM | POA: Diagnosis not present

## 2017-05-09 DIAGNOSIS — L89322 Pressure ulcer of left buttock, stage 2: Secondary | ICD-10-CM | POA: Diagnosis not present

## 2017-05-09 DIAGNOSIS — B9562 Methicillin resistant Staphylococcus aureus infection as the cause of diseases classified elsewhere: Secondary | ICD-10-CM | POA: Diagnosis not present

## 2017-05-09 DIAGNOSIS — Z9181 History of falling: Secondary | ICD-10-CM | POA: Diagnosis not present

## 2017-05-09 DIAGNOSIS — K5909 Other constipation: Secondary | ICD-10-CM | POA: Diagnosis not present

## 2017-05-10 DIAGNOSIS — Z9181 History of falling: Secondary | ICD-10-CM | POA: Diagnosis not present

## 2017-05-10 DIAGNOSIS — L89322 Pressure ulcer of left buttock, stage 2: Secondary | ICD-10-CM | POA: Diagnosis not present

## 2017-05-10 DIAGNOSIS — L03317 Cellulitis of buttock: Secondary | ICD-10-CM | POA: Diagnosis not present

## 2017-05-10 DIAGNOSIS — B9562 Methicillin resistant Staphylococcus aureus infection as the cause of diseases classified elsewhere: Secondary | ICD-10-CM | POA: Diagnosis not present

## 2017-05-10 DIAGNOSIS — R531 Weakness: Secondary | ICD-10-CM | POA: Diagnosis not present

## 2017-05-10 DIAGNOSIS — K5909 Other constipation: Secondary | ICD-10-CM | POA: Diagnosis not present

## 2017-05-11 DIAGNOSIS — R531 Weakness: Secondary | ICD-10-CM | POA: Diagnosis not present

## 2017-05-11 DIAGNOSIS — Z9181 History of falling: Secondary | ICD-10-CM | POA: Diagnosis not present

## 2017-05-11 DIAGNOSIS — K5909 Other constipation: Secondary | ICD-10-CM | POA: Diagnosis not present

## 2017-05-11 DIAGNOSIS — L89322 Pressure ulcer of left buttock, stage 2: Secondary | ICD-10-CM | POA: Diagnosis not present

## 2017-05-11 DIAGNOSIS — L03317 Cellulitis of buttock: Secondary | ICD-10-CM | POA: Diagnosis not present

## 2017-05-11 DIAGNOSIS — B9562 Methicillin resistant Staphylococcus aureus infection as the cause of diseases classified elsewhere: Secondary | ICD-10-CM | POA: Diagnosis not present

## 2017-05-12 DIAGNOSIS — L03317 Cellulitis of buttock: Secondary | ICD-10-CM | POA: Diagnosis not present

## 2017-05-12 DIAGNOSIS — B9562 Methicillin resistant Staphylococcus aureus infection as the cause of diseases classified elsewhere: Secondary | ICD-10-CM | POA: Diagnosis not present

## 2017-05-12 DIAGNOSIS — K5909 Other constipation: Secondary | ICD-10-CM | POA: Diagnosis not present

## 2017-05-12 DIAGNOSIS — R531 Weakness: Secondary | ICD-10-CM | POA: Diagnosis not present

## 2017-05-12 DIAGNOSIS — H04123 Dry eye syndrome of bilateral lacrimal glands: Secondary | ICD-10-CM | POA: Diagnosis not present

## 2017-05-12 DIAGNOSIS — L89322 Pressure ulcer of left buttock, stage 2: Secondary | ICD-10-CM | POA: Diagnosis not present

## 2017-05-12 DIAGNOSIS — Z9181 History of falling: Secondary | ICD-10-CM | POA: Diagnosis not present

## 2017-05-13 DIAGNOSIS — L89322 Pressure ulcer of left buttock, stage 2: Secondary | ICD-10-CM | POA: Diagnosis not present

## 2017-05-13 DIAGNOSIS — Z9181 History of falling: Secondary | ICD-10-CM | POA: Diagnosis not present

## 2017-05-13 DIAGNOSIS — B9562 Methicillin resistant Staphylococcus aureus infection as the cause of diseases classified elsewhere: Secondary | ICD-10-CM | POA: Diagnosis not present

## 2017-05-13 DIAGNOSIS — L03317 Cellulitis of buttock: Secondary | ICD-10-CM | POA: Diagnosis not present

## 2017-05-13 DIAGNOSIS — K5909 Other constipation: Secondary | ICD-10-CM | POA: Diagnosis not present

## 2017-05-13 DIAGNOSIS — R531 Weakness: Secondary | ICD-10-CM | POA: Diagnosis not present

## 2017-05-14 DIAGNOSIS — Z9181 History of falling: Secondary | ICD-10-CM | POA: Diagnosis not present

## 2017-05-14 DIAGNOSIS — B9562 Methicillin resistant Staphylococcus aureus infection as the cause of diseases classified elsewhere: Secondary | ICD-10-CM | POA: Diagnosis not present

## 2017-05-14 DIAGNOSIS — K5909 Other constipation: Secondary | ICD-10-CM | POA: Diagnosis not present

## 2017-05-14 DIAGNOSIS — L89322 Pressure ulcer of left buttock, stage 2: Secondary | ICD-10-CM | POA: Diagnosis not present

## 2017-05-14 DIAGNOSIS — L03317 Cellulitis of buttock: Secondary | ICD-10-CM | POA: Diagnosis not present

## 2017-05-14 DIAGNOSIS — R531 Weakness: Secondary | ICD-10-CM | POA: Diagnosis not present

## 2017-05-15 DIAGNOSIS — R531 Weakness: Secondary | ICD-10-CM | POA: Diagnosis not present

## 2017-05-15 DIAGNOSIS — B9562 Methicillin resistant Staphylococcus aureus infection as the cause of diseases classified elsewhere: Secondary | ICD-10-CM | POA: Diagnosis not present

## 2017-05-15 DIAGNOSIS — L03317 Cellulitis of buttock: Secondary | ICD-10-CM | POA: Diagnosis not present

## 2017-05-15 DIAGNOSIS — K5909 Other constipation: Secondary | ICD-10-CM | POA: Diagnosis not present

## 2017-05-15 DIAGNOSIS — Z9181 History of falling: Secondary | ICD-10-CM | POA: Diagnosis not present

## 2017-05-15 DIAGNOSIS — L89322 Pressure ulcer of left buttock, stage 2: Secondary | ICD-10-CM | POA: Diagnosis not present

## 2017-05-16 DIAGNOSIS — Z9181 History of falling: Secondary | ICD-10-CM | POA: Diagnosis not present

## 2017-05-16 DIAGNOSIS — K5909 Other constipation: Secondary | ICD-10-CM | POA: Diagnosis not present

## 2017-05-16 DIAGNOSIS — R531 Weakness: Secondary | ICD-10-CM | POA: Diagnosis not present

## 2017-05-16 DIAGNOSIS — L03317 Cellulitis of buttock: Secondary | ICD-10-CM | POA: Diagnosis not present

## 2017-05-16 DIAGNOSIS — L89322 Pressure ulcer of left buttock, stage 2: Secondary | ICD-10-CM | POA: Diagnosis not present

## 2017-05-16 DIAGNOSIS — B9562 Methicillin resistant Staphylococcus aureus infection as the cause of diseases classified elsewhere: Secondary | ICD-10-CM | POA: Diagnosis not present

## 2017-05-17 DIAGNOSIS — B9562 Methicillin resistant Staphylococcus aureus infection as the cause of diseases classified elsewhere: Secondary | ICD-10-CM | POA: Diagnosis not present

## 2017-05-17 DIAGNOSIS — R531 Weakness: Secondary | ICD-10-CM | POA: Diagnosis not present

## 2017-05-17 DIAGNOSIS — L89322 Pressure ulcer of left buttock, stage 2: Secondary | ICD-10-CM | POA: Diagnosis not present

## 2017-05-17 DIAGNOSIS — K5909 Other constipation: Secondary | ICD-10-CM | POA: Diagnosis not present

## 2017-05-17 DIAGNOSIS — Z9181 History of falling: Secondary | ICD-10-CM | POA: Diagnosis not present

## 2017-05-17 DIAGNOSIS — L03317 Cellulitis of buttock: Secondary | ICD-10-CM | POA: Diagnosis not present

## 2017-05-18 DIAGNOSIS — K5909 Other constipation: Secondary | ICD-10-CM | POA: Diagnosis not present

## 2017-05-18 DIAGNOSIS — Z9181 History of falling: Secondary | ICD-10-CM | POA: Diagnosis not present

## 2017-05-18 DIAGNOSIS — L89322 Pressure ulcer of left buttock, stage 2: Secondary | ICD-10-CM | POA: Diagnosis not present

## 2017-05-18 DIAGNOSIS — B9562 Methicillin resistant Staphylococcus aureus infection as the cause of diseases classified elsewhere: Secondary | ICD-10-CM | POA: Diagnosis not present

## 2017-05-18 DIAGNOSIS — R531 Weakness: Secondary | ICD-10-CM | POA: Diagnosis not present

## 2017-05-18 DIAGNOSIS — L03317 Cellulitis of buttock: Secondary | ICD-10-CM | POA: Diagnosis not present

## 2017-05-19 DIAGNOSIS — R531 Weakness: Secondary | ICD-10-CM | POA: Diagnosis not present

## 2017-05-19 DIAGNOSIS — B9562 Methicillin resistant Staphylococcus aureus infection as the cause of diseases classified elsewhere: Secondary | ICD-10-CM | POA: Diagnosis not present

## 2017-05-19 DIAGNOSIS — L03317 Cellulitis of buttock: Secondary | ICD-10-CM | POA: Diagnosis not present

## 2017-05-19 DIAGNOSIS — L89322 Pressure ulcer of left buttock, stage 2: Secondary | ICD-10-CM | POA: Diagnosis not present

## 2017-05-19 DIAGNOSIS — Z9181 History of falling: Secondary | ICD-10-CM | POA: Diagnosis not present

## 2017-05-19 DIAGNOSIS — K5909 Other constipation: Secondary | ICD-10-CM | POA: Diagnosis not present

## 2017-05-20 DIAGNOSIS — B9562 Methicillin resistant Staphylococcus aureus infection as the cause of diseases classified elsewhere: Secondary | ICD-10-CM | POA: Diagnosis not present

## 2017-05-20 DIAGNOSIS — K5909 Other constipation: Secondary | ICD-10-CM | POA: Diagnosis not present

## 2017-05-20 DIAGNOSIS — R531 Weakness: Secondary | ICD-10-CM | POA: Diagnosis not present

## 2017-05-20 DIAGNOSIS — L03317 Cellulitis of buttock: Secondary | ICD-10-CM | POA: Diagnosis not present

## 2017-05-20 DIAGNOSIS — L89322 Pressure ulcer of left buttock, stage 2: Secondary | ICD-10-CM | POA: Diagnosis not present

## 2017-05-20 DIAGNOSIS — Z9181 History of falling: Secondary | ICD-10-CM | POA: Diagnosis not present

## 2017-05-21 DIAGNOSIS — K5909 Other constipation: Secondary | ICD-10-CM | POA: Diagnosis not present

## 2017-05-21 DIAGNOSIS — R531 Weakness: Secondary | ICD-10-CM | POA: Diagnosis not present

## 2017-05-21 DIAGNOSIS — B9562 Methicillin resistant Staphylococcus aureus infection as the cause of diseases classified elsewhere: Secondary | ICD-10-CM | POA: Diagnosis not present

## 2017-05-21 DIAGNOSIS — Z9181 History of falling: Secondary | ICD-10-CM | POA: Diagnosis not present

## 2017-05-21 DIAGNOSIS — L89322 Pressure ulcer of left buttock, stage 2: Secondary | ICD-10-CM | POA: Diagnosis not present

## 2017-05-21 DIAGNOSIS — L03317 Cellulitis of buttock: Secondary | ICD-10-CM | POA: Diagnosis not present

## 2017-05-22 DIAGNOSIS — R531 Weakness: Secondary | ICD-10-CM | POA: Diagnosis not present

## 2017-05-22 DIAGNOSIS — Z9181 History of falling: Secondary | ICD-10-CM | POA: Diagnosis not present

## 2017-05-22 DIAGNOSIS — L03317 Cellulitis of buttock: Secondary | ICD-10-CM | POA: Diagnosis not present

## 2017-05-22 DIAGNOSIS — K5909 Other constipation: Secondary | ICD-10-CM | POA: Diagnosis not present

## 2017-05-22 DIAGNOSIS — L89322 Pressure ulcer of left buttock, stage 2: Secondary | ICD-10-CM | POA: Diagnosis not present

## 2017-05-22 DIAGNOSIS — B9562 Methicillin resistant Staphylococcus aureus infection as the cause of diseases classified elsewhere: Secondary | ICD-10-CM | POA: Diagnosis not present

## 2017-05-23 DIAGNOSIS — B9562 Methicillin resistant Staphylococcus aureus infection as the cause of diseases classified elsewhere: Secondary | ICD-10-CM | POA: Diagnosis not present

## 2017-05-23 DIAGNOSIS — L03317 Cellulitis of buttock: Secondary | ICD-10-CM | POA: Diagnosis not present

## 2017-05-23 DIAGNOSIS — K5909 Other constipation: Secondary | ICD-10-CM | POA: Diagnosis not present

## 2017-05-23 DIAGNOSIS — R531 Weakness: Secondary | ICD-10-CM | POA: Diagnosis not present

## 2017-05-23 DIAGNOSIS — Z9181 History of falling: Secondary | ICD-10-CM | POA: Diagnosis not present

## 2017-05-23 DIAGNOSIS — L89322 Pressure ulcer of left buttock, stage 2: Secondary | ICD-10-CM | POA: Diagnosis not present

## 2017-05-24 DIAGNOSIS — L03317 Cellulitis of buttock: Secondary | ICD-10-CM | POA: Diagnosis not present

## 2017-05-24 DIAGNOSIS — Z9181 History of falling: Secondary | ICD-10-CM | POA: Diagnosis not present

## 2017-05-24 DIAGNOSIS — B9562 Methicillin resistant Staphylococcus aureus infection as the cause of diseases classified elsewhere: Secondary | ICD-10-CM | POA: Diagnosis not present

## 2017-05-24 DIAGNOSIS — R531 Weakness: Secondary | ICD-10-CM | POA: Diagnosis not present

## 2017-05-24 DIAGNOSIS — L89322 Pressure ulcer of left buttock, stage 2: Secondary | ICD-10-CM | POA: Diagnosis not present

## 2017-05-24 DIAGNOSIS — K5909 Other constipation: Secondary | ICD-10-CM | POA: Diagnosis not present

## 2017-05-25 DIAGNOSIS — Z4800 Encounter for change or removal of nonsurgical wound dressing: Secondary | ICD-10-CM | POA: Diagnosis not present

## 2017-05-25 DIAGNOSIS — E43 Unspecified severe protein-calorie malnutrition: Secondary | ICD-10-CM | POA: Diagnosis not present

## 2017-05-25 DIAGNOSIS — Z993 Dependence on wheelchair: Secondary | ICD-10-CM | POA: Diagnosis not present

## 2017-05-25 DIAGNOSIS — Z9181 History of falling: Secondary | ICD-10-CM | POA: Diagnosis not present

## 2017-05-25 DIAGNOSIS — K5909 Other constipation: Secondary | ICD-10-CM | POA: Diagnosis not present

## 2017-05-25 DIAGNOSIS — R531 Weakness: Secondary | ICD-10-CM | POA: Diagnosis not present

## 2017-05-25 DIAGNOSIS — Z87440 Personal history of urinary (tract) infections: Secondary | ICD-10-CM | POA: Diagnosis not present

## 2017-05-25 DIAGNOSIS — M341 CR(E)ST syndrome: Secondary | ICD-10-CM | POA: Diagnosis not present

## 2017-05-25 DIAGNOSIS — I10 Essential (primary) hypertension: Secondary | ICD-10-CM | POA: Diagnosis not present

## 2017-05-25 DIAGNOSIS — L89322 Pressure ulcer of left buttock, stage 2: Secondary | ICD-10-CM | POA: Diagnosis not present

## 2017-05-25 DIAGNOSIS — M810 Age-related osteoporosis without current pathological fracture: Secondary | ICD-10-CM | POA: Diagnosis not present

## 2017-05-25 DIAGNOSIS — N079 Hereditary nephropathy, not elsewhere classified with unspecified morphologic lesions: Secondary | ICD-10-CM | POA: Diagnosis not present

## 2017-05-26 DIAGNOSIS — E43 Unspecified severe protein-calorie malnutrition: Secondary | ICD-10-CM | POA: Diagnosis not present

## 2017-05-26 DIAGNOSIS — R531 Weakness: Secondary | ICD-10-CM | POA: Diagnosis not present

## 2017-05-26 DIAGNOSIS — L89322 Pressure ulcer of left buttock, stage 2: Secondary | ICD-10-CM | POA: Diagnosis not present

## 2017-05-26 DIAGNOSIS — K5909 Other constipation: Secondary | ICD-10-CM | POA: Diagnosis not present

## 2017-05-26 DIAGNOSIS — M810 Age-related osteoporosis without current pathological fracture: Secondary | ICD-10-CM | POA: Diagnosis not present

## 2017-05-26 DIAGNOSIS — I10 Essential (primary) hypertension: Secondary | ICD-10-CM | POA: Diagnosis not present

## 2017-05-27 ENCOUNTER — Encounter (HOSPITAL_BASED_OUTPATIENT_CLINIC_OR_DEPARTMENT_OTHER): Payer: Medicare Other | Attending: Surgery

## 2017-05-27 DIAGNOSIS — L89323 Pressure ulcer of left buttock, stage 3: Secondary | ICD-10-CM | POA: Diagnosis not present

## 2017-05-27 DIAGNOSIS — I10 Essential (primary) hypertension: Secondary | ICD-10-CM | POA: Diagnosis not present

## 2017-05-27 DIAGNOSIS — Z79899 Other long term (current) drug therapy: Secondary | ICD-10-CM | POA: Diagnosis not present

## 2017-05-27 DIAGNOSIS — G629 Polyneuropathy, unspecified: Secondary | ICD-10-CM | POA: Diagnosis not present

## 2017-05-27 DIAGNOSIS — E440 Moderate protein-calorie malnutrition: Secondary | ICD-10-CM | POA: Diagnosis not present

## 2017-05-27 DIAGNOSIS — Z7951 Long term (current) use of inhaled steroids: Secondary | ICD-10-CM | POA: Diagnosis not present

## 2017-05-27 DIAGNOSIS — Z993 Dependence on wheelchair: Secondary | ICD-10-CM | POA: Diagnosis not present

## 2017-05-29 DIAGNOSIS — R531 Weakness: Secondary | ICD-10-CM | POA: Diagnosis not present

## 2017-05-29 DIAGNOSIS — I10 Essential (primary) hypertension: Secondary | ICD-10-CM | POA: Diagnosis not present

## 2017-05-29 DIAGNOSIS — M810 Age-related osteoporosis without current pathological fracture: Secondary | ICD-10-CM | POA: Diagnosis not present

## 2017-05-29 DIAGNOSIS — K5909 Other constipation: Secondary | ICD-10-CM | POA: Diagnosis not present

## 2017-05-29 DIAGNOSIS — E43 Unspecified severe protein-calorie malnutrition: Secondary | ICD-10-CM | POA: Diagnosis not present

## 2017-05-29 DIAGNOSIS — L89322 Pressure ulcer of left buttock, stage 2: Secondary | ICD-10-CM | POA: Diagnosis not present

## 2017-05-31 DIAGNOSIS — K5909 Other constipation: Secondary | ICD-10-CM | POA: Diagnosis not present

## 2017-05-31 DIAGNOSIS — L89322 Pressure ulcer of left buttock, stage 2: Secondary | ICD-10-CM | POA: Diagnosis not present

## 2017-05-31 DIAGNOSIS — R531 Weakness: Secondary | ICD-10-CM | POA: Diagnosis not present

## 2017-05-31 DIAGNOSIS — M810 Age-related osteoporosis without current pathological fracture: Secondary | ICD-10-CM | POA: Diagnosis not present

## 2017-05-31 DIAGNOSIS — E43 Unspecified severe protein-calorie malnutrition: Secondary | ICD-10-CM | POA: Diagnosis not present

## 2017-05-31 DIAGNOSIS — I10 Essential (primary) hypertension: Secondary | ICD-10-CM | POA: Diagnosis not present

## 2017-06-02 DIAGNOSIS — R531 Weakness: Secondary | ICD-10-CM | POA: Diagnosis not present

## 2017-06-02 DIAGNOSIS — L89322 Pressure ulcer of left buttock, stage 2: Secondary | ICD-10-CM | POA: Diagnosis not present

## 2017-06-02 DIAGNOSIS — K5909 Other constipation: Secondary | ICD-10-CM | POA: Diagnosis not present

## 2017-06-02 DIAGNOSIS — I10 Essential (primary) hypertension: Secondary | ICD-10-CM | POA: Diagnosis not present

## 2017-06-02 DIAGNOSIS — M810 Age-related osteoporosis without current pathological fracture: Secondary | ICD-10-CM | POA: Diagnosis not present

## 2017-06-02 DIAGNOSIS — E43 Unspecified severe protein-calorie malnutrition: Secondary | ICD-10-CM | POA: Diagnosis not present

## 2017-06-03 ENCOUNTER — Encounter (HOSPITAL_BASED_OUTPATIENT_CLINIC_OR_DEPARTMENT_OTHER): Payer: Medicare Other | Attending: Surgery

## 2017-06-03 DIAGNOSIS — L89323 Pressure ulcer of left buttock, stage 3: Secondary | ICD-10-CM | POA: Insufficient documentation

## 2017-06-03 DIAGNOSIS — Z993 Dependence on wheelchair: Secondary | ICD-10-CM | POA: Insufficient documentation

## 2017-06-03 DIAGNOSIS — L989 Disorder of the skin and subcutaneous tissue, unspecified: Secondary | ICD-10-CM | POA: Insufficient documentation

## 2017-06-03 DIAGNOSIS — E440 Moderate protein-calorie malnutrition: Secondary | ICD-10-CM | POA: Insufficient documentation

## 2017-06-04 DIAGNOSIS — K5909 Other constipation: Secondary | ICD-10-CM | POA: Diagnosis not present

## 2017-06-04 DIAGNOSIS — E43 Unspecified severe protein-calorie malnutrition: Secondary | ICD-10-CM | POA: Diagnosis not present

## 2017-06-04 DIAGNOSIS — M810 Age-related osteoporosis without current pathological fracture: Secondary | ICD-10-CM | POA: Diagnosis not present

## 2017-06-04 DIAGNOSIS — L89322 Pressure ulcer of left buttock, stage 2: Secondary | ICD-10-CM | POA: Diagnosis not present

## 2017-06-04 DIAGNOSIS — R531 Weakness: Secondary | ICD-10-CM | POA: Diagnosis not present

## 2017-06-04 DIAGNOSIS — I10 Essential (primary) hypertension: Secondary | ICD-10-CM | POA: Diagnosis not present

## 2017-06-06 DIAGNOSIS — K5909 Other constipation: Secondary | ICD-10-CM | POA: Diagnosis not present

## 2017-06-06 DIAGNOSIS — R531 Weakness: Secondary | ICD-10-CM | POA: Diagnosis not present

## 2017-06-06 DIAGNOSIS — I10 Essential (primary) hypertension: Secondary | ICD-10-CM | POA: Diagnosis not present

## 2017-06-06 DIAGNOSIS — L89322 Pressure ulcer of left buttock, stage 2: Secondary | ICD-10-CM | POA: Diagnosis not present

## 2017-06-06 DIAGNOSIS — M810 Age-related osteoporosis without current pathological fracture: Secondary | ICD-10-CM | POA: Diagnosis not present

## 2017-06-06 DIAGNOSIS — E43 Unspecified severe protein-calorie malnutrition: Secondary | ICD-10-CM | POA: Diagnosis not present

## 2017-06-08 DIAGNOSIS — I10 Essential (primary) hypertension: Secondary | ICD-10-CM | POA: Diagnosis not present

## 2017-06-08 DIAGNOSIS — R531 Weakness: Secondary | ICD-10-CM | POA: Diagnosis not present

## 2017-06-08 DIAGNOSIS — E43 Unspecified severe protein-calorie malnutrition: Secondary | ICD-10-CM | POA: Diagnosis not present

## 2017-06-08 DIAGNOSIS — K5909 Other constipation: Secondary | ICD-10-CM | POA: Diagnosis not present

## 2017-06-08 DIAGNOSIS — M810 Age-related osteoporosis without current pathological fracture: Secondary | ICD-10-CM | POA: Diagnosis not present

## 2017-06-08 DIAGNOSIS — L89322 Pressure ulcer of left buttock, stage 2: Secondary | ICD-10-CM | POA: Diagnosis not present

## 2017-06-10 DIAGNOSIS — L98492 Non-pressure chronic ulcer of skin of other sites with fat layer exposed: Secondary | ICD-10-CM | POA: Diagnosis not present

## 2017-06-10 DIAGNOSIS — E440 Moderate protein-calorie malnutrition: Secondary | ICD-10-CM | POA: Diagnosis not present

## 2017-06-10 DIAGNOSIS — L89323 Pressure ulcer of left buttock, stage 3: Secondary | ICD-10-CM | POA: Diagnosis not present

## 2017-06-10 DIAGNOSIS — Z993 Dependence on wheelchair: Secondary | ICD-10-CM | POA: Diagnosis not present

## 2017-06-10 DIAGNOSIS — L989 Disorder of the skin and subcutaneous tissue, unspecified: Secondary | ICD-10-CM | POA: Diagnosis not present

## 2017-06-11 DIAGNOSIS — L89322 Pressure ulcer of left buttock, stage 2: Secondary | ICD-10-CM | POA: Diagnosis not present

## 2017-06-11 DIAGNOSIS — I10 Essential (primary) hypertension: Secondary | ICD-10-CM | POA: Diagnosis not present

## 2017-06-11 DIAGNOSIS — R531 Weakness: Secondary | ICD-10-CM | POA: Diagnosis not present

## 2017-06-11 DIAGNOSIS — K5909 Other constipation: Secondary | ICD-10-CM | POA: Diagnosis not present

## 2017-06-11 DIAGNOSIS — M810 Age-related osteoporosis without current pathological fracture: Secondary | ICD-10-CM | POA: Diagnosis not present

## 2017-06-11 DIAGNOSIS — E43 Unspecified severe protein-calorie malnutrition: Secondary | ICD-10-CM | POA: Diagnosis not present

## 2017-06-13 DIAGNOSIS — R531 Weakness: Secondary | ICD-10-CM | POA: Diagnosis not present

## 2017-06-13 DIAGNOSIS — K5909 Other constipation: Secondary | ICD-10-CM | POA: Diagnosis not present

## 2017-06-13 DIAGNOSIS — M810 Age-related osteoporosis without current pathological fracture: Secondary | ICD-10-CM | POA: Diagnosis not present

## 2017-06-13 DIAGNOSIS — E43 Unspecified severe protein-calorie malnutrition: Secondary | ICD-10-CM | POA: Diagnosis not present

## 2017-06-13 DIAGNOSIS — L89322 Pressure ulcer of left buttock, stage 2: Secondary | ICD-10-CM | POA: Diagnosis not present

## 2017-06-13 DIAGNOSIS — I10 Essential (primary) hypertension: Secondary | ICD-10-CM | POA: Diagnosis not present

## 2017-06-15 DIAGNOSIS — I10 Essential (primary) hypertension: Secondary | ICD-10-CM | POA: Diagnosis not present

## 2017-06-15 DIAGNOSIS — K5909 Other constipation: Secondary | ICD-10-CM | POA: Diagnosis not present

## 2017-06-15 DIAGNOSIS — E43 Unspecified severe protein-calorie malnutrition: Secondary | ICD-10-CM | POA: Diagnosis not present

## 2017-06-15 DIAGNOSIS — R531 Weakness: Secondary | ICD-10-CM | POA: Diagnosis not present

## 2017-06-15 DIAGNOSIS — L89322 Pressure ulcer of left buttock, stage 2: Secondary | ICD-10-CM | POA: Diagnosis not present

## 2017-06-15 DIAGNOSIS — M810 Age-related osteoporosis without current pathological fracture: Secondary | ICD-10-CM | POA: Diagnosis not present

## 2017-06-17 DIAGNOSIS — Z993 Dependence on wheelchair: Secondary | ICD-10-CM | POA: Diagnosis not present

## 2017-06-17 DIAGNOSIS — L98492 Non-pressure chronic ulcer of skin of other sites with fat layer exposed: Secondary | ICD-10-CM | POA: Diagnosis not present

## 2017-06-17 DIAGNOSIS — E440 Moderate protein-calorie malnutrition: Secondary | ICD-10-CM | POA: Diagnosis not present

## 2017-06-17 DIAGNOSIS — L89323 Pressure ulcer of left buttock, stage 3: Secondary | ICD-10-CM | POA: Diagnosis not present

## 2017-06-17 DIAGNOSIS — L989 Disorder of the skin and subcutaneous tissue, unspecified: Secondary | ICD-10-CM | POA: Diagnosis not present

## 2017-06-18 DIAGNOSIS — D485 Neoplasm of uncertain behavior of skin: Secondary | ICD-10-CM | POA: Diagnosis not present

## 2017-06-18 DIAGNOSIS — L859 Epidermal thickening, unspecified: Secondary | ICD-10-CM | POA: Diagnosis not present

## 2017-06-20 DIAGNOSIS — I10 Essential (primary) hypertension: Secondary | ICD-10-CM | POA: Diagnosis not present

## 2017-06-20 DIAGNOSIS — M810 Age-related osteoporosis without current pathological fracture: Secondary | ICD-10-CM | POA: Diagnosis not present

## 2017-06-20 DIAGNOSIS — R531 Weakness: Secondary | ICD-10-CM | POA: Diagnosis not present

## 2017-06-20 DIAGNOSIS — L89322 Pressure ulcer of left buttock, stage 2: Secondary | ICD-10-CM | POA: Diagnosis not present

## 2017-06-20 DIAGNOSIS — K5909 Other constipation: Secondary | ICD-10-CM | POA: Diagnosis not present

## 2017-06-20 DIAGNOSIS — E43 Unspecified severe protein-calorie malnutrition: Secondary | ICD-10-CM | POA: Diagnosis not present

## 2017-06-22 DIAGNOSIS — M810 Age-related osteoporosis without current pathological fracture: Secondary | ICD-10-CM | POA: Diagnosis not present

## 2017-06-22 DIAGNOSIS — K5909 Other constipation: Secondary | ICD-10-CM | POA: Diagnosis not present

## 2017-06-22 DIAGNOSIS — L89322 Pressure ulcer of left buttock, stage 2: Secondary | ICD-10-CM | POA: Diagnosis not present

## 2017-06-22 DIAGNOSIS — E43 Unspecified severe protein-calorie malnutrition: Secondary | ICD-10-CM | POA: Diagnosis not present

## 2017-06-22 DIAGNOSIS — I10 Essential (primary) hypertension: Secondary | ICD-10-CM | POA: Diagnosis not present

## 2017-06-22 DIAGNOSIS — R531 Weakness: Secondary | ICD-10-CM | POA: Diagnosis not present

## 2017-06-24 DIAGNOSIS — E440 Moderate protein-calorie malnutrition: Secondary | ICD-10-CM | POA: Diagnosis not present

## 2017-06-24 DIAGNOSIS — L89323 Pressure ulcer of left buttock, stage 3: Secondary | ICD-10-CM | POA: Diagnosis not present

## 2017-06-24 DIAGNOSIS — Z993 Dependence on wheelchair: Secondary | ICD-10-CM | POA: Diagnosis not present

## 2017-06-24 DIAGNOSIS — S01301A Unspecified open wound of right ear, initial encounter: Secondary | ICD-10-CM | POA: Diagnosis not present

## 2017-06-24 DIAGNOSIS — L989 Disorder of the skin and subcutaneous tissue, unspecified: Secondary | ICD-10-CM | POA: Diagnosis not present

## 2017-06-25 DIAGNOSIS — L89322 Pressure ulcer of left buttock, stage 2: Secondary | ICD-10-CM | POA: Diagnosis not present

## 2017-06-25 DIAGNOSIS — K5909 Other constipation: Secondary | ICD-10-CM | POA: Diagnosis not present

## 2017-06-25 DIAGNOSIS — R531 Weakness: Secondary | ICD-10-CM | POA: Diagnosis not present

## 2017-06-25 DIAGNOSIS — M810 Age-related osteoporosis without current pathological fracture: Secondary | ICD-10-CM | POA: Diagnosis not present

## 2017-06-25 DIAGNOSIS — E43 Unspecified severe protein-calorie malnutrition: Secondary | ICD-10-CM | POA: Diagnosis not present

## 2017-06-25 DIAGNOSIS — I10 Essential (primary) hypertension: Secondary | ICD-10-CM | POA: Diagnosis not present

## 2017-06-26 DIAGNOSIS — L89322 Pressure ulcer of left buttock, stage 2: Secondary | ICD-10-CM | POA: Diagnosis not present

## 2017-06-26 DIAGNOSIS — I10 Essential (primary) hypertension: Secondary | ICD-10-CM | POA: Diagnosis not present

## 2017-06-26 DIAGNOSIS — M810 Age-related osteoporosis without current pathological fracture: Secondary | ICD-10-CM | POA: Diagnosis not present

## 2017-06-26 DIAGNOSIS — E43 Unspecified severe protein-calorie malnutrition: Secondary | ICD-10-CM | POA: Diagnosis not present

## 2017-06-26 DIAGNOSIS — K5909 Other constipation: Secondary | ICD-10-CM | POA: Diagnosis not present

## 2017-06-26 DIAGNOSIS — R531 Weakness: Secondary | ICD-10-CM | POA: Diagnosis not present

## 2017-06-27 DIAGNOSIS — K5909 Other constipation: Secondary | ICD-10-CM | POA: Diagnosis not present

## 2017-06-27 DIAGNOSIS — I10 Essential (primary) hypertension: Secondary | ICD-10-CM | POA: Diagnosis not present

## 2017-06-27 DIAGNOSIS — L89322 Pressure ulcer of left buttock, stage 2: Secondary | ICD-10-CM | POA: Diagnosis not present

## 2017-06-27 DIAGNOSIS — E43 Unspecified severe protein-calorie malnutrition: Secondary | ICD-10-CM | POA: Diagnosis not present

## 2017-06-27 DIAGNOSIS — M810 Age-related osteoporosis without current pathological fracture: Secondary | ICD-10-CM | POA: Diagnosis not present

## 2017-06-27 DIAGNOSIS — R531 Weakness: Secondary | ICD-10-CM | POA: Diagnosis not present

## 2017-06-29 DIAGNOSIS — E43 Unspecified severe protein-calorie malnutrition: Secondary | ICD-10-CM | POA: Diagnosis not present

## 2017-06-29 DIAGNOSIS — I10 Essential (primary) hypertension: Secondary | ICD-10-CM | POA: Diagnosis not present

## 2017-06-29 DIAGNOSIS — L89322 Pressure ulcer of left buttock, stage 2: Secondary | ICD-10-CM | POA: Diagnosis not present

## 2017-06-29 DIAGNOSIS — R531 Weakness: Secondary | ICD-10-CM | POA: Diagnosis not present

## 2017-06-29 DIAGNOSIS — K5909 Other constipation: Secondary | ICD-10-CM | POA: Diagnosis not present

## 2017-06-29 DIAGNOSIS — M810 Age-related osteoporosis without current pathological fracture: Secondary | ICD-10-CM | POA: Diagnosis not present

## 2017-06-30 DIAGNOSIS — I10 Essential (primary) hypertension: Secondary | ICD-10-CM | POA: Diagnosis not present

## 2017-06-30 DIAGNOSIS — K5909 Other constipation: Secondary | ICD-10-CM | POA: Diagnosis not present

## 2017-06-30 DIAGNOSIS — R531 Weakness: Secondary | ICD-10-CM | POA: Diagnosis not present

## 2017-06-30 DIAGNOSIS — M810 Age-related osteoporosis without current pathological fracture: Secondary | ICD-10-CM | POA: Diagnosis not present

## 2017-06-30 DIAGNOSIS — E43 Unspecified severe protein-calorie malnutrition: Secondary | ICD-10-CM | POA: Diagnosis not present

## 2017-06-30 DIAGNOSIS — L89322 Pressure ulcer of left buttock, stage 2: Secondary | ICD-10-CM | POA: Diagnosis not present

## 2017-07-01 ENCOUNTER — Encounter (HOSPITAL_BASED_OUTPATIENT_CLINIC_OR_DEPARTMENT_OTHER): Payer: Medicare Other | Attending: Surgery

## 2017-07-01 DIAGNOSIS — G629 Polyneuropathy, unspecified: Secondary | ICD-10-CM | POA: Diagnosis not present

## 2017-07-01 DIAGNOSIS — C44202 Unspecified malignant neoplasm of skin of right ear and external auricular canal: Secondary | ICD-10-CM | POA: Insufficient documentation

## 2017-07-01 DIAGNOSIS — E440 Moderate protein-calorie malnutrition: Secondary | ICD-10-CM | POA: Insufficient documentation

## 2017-07-01 DIAGNOSIS — I10 Essential (primary) hypertension: Secondary | ICD-10-CM | POA: Diagnosis not present

## 2017-07-01 DIAGNOSIS — L89323 Pressure ulcer of left buttock, stage 3: Secondary | ICD-10-CM | POA: Insufficient documentation

## 2017-07-01 DIAGNOSIS — Z993 Dependence on wheelchair: Secondary | ICD-10-CM | POA: Diagnosis not present

## 2017-07-01 DIAGNOSIS — S01301A Unspecified open wound of right ear, initial encounter: Secondary | ICD-10-CM | POA: Diagnosis not present

## 2017-07-03 DIAGNOSIS — R531 Weakness: Secondary | ICD-10-CM | POA: Diagnosis not present

## 2017-07-03 DIAGNOSIS — K5909 Other constipation: Secondary | ICD-10-CM | POA: Diagnosis not present

## 2017-07-03 DIAGNOSIS — M810 Age-related osteoporosis without current pathological fracture: Secondary | ICD-10-CM | POA: Diagnosis not present

## 2017-07-03 DIAGNOSIS — I10 Essential (primary) hypertension: Secondary | ICD-10-CM | POA: Diagnosis not present

## 2017-07-03 DIAGNOSIS — E43 Unspecified severe protein-calorie malnutrition: Secondary | ICD-10-CM | POA: Diagnosis not present

## 2017-07-03 DIAGNOSIS — L89322 Pressure ulcer of left buttock, stage 2: Secondary | ICD-10-CM | POA: Diagnosis not present

## 2017-07-05 DIAGNOSIS — I10 Essential (primary) hypertension: Secondary | ICD-10-CM | POA: Diagnosis not present

## 2017-07-05 DIAGNOSIS — K5909 Other constipation: Secondary | ICD-10-CM | POA: Diagnosis not present

## 2017-07-05 DIAGNOSIS — L89322 Pressure ulcer of left buttock, stage 2: Secondary | ICD-10-CM | POA: Diagnosis not present

## 2017-07-05 DIAGNOSIS — M810 Age-related osteoporosis without current pathological fracture: Secondary | ICD-10-CM | POA: Diagnosis not present

## 2017-07-05 DIAGNOSIS — E43 Unspecified severe protein-calorie malnutrition: Secondary | ICD-10-CM | POA: Diagnosis not present

## 2017-07-05 DIAGNOSIS — R531 Weakness: Secondary | ICD-10-CM | POA: Diagnosis not present

## 2017-07-08 DIAGNOSIS — I10 Essential (primary) hypertension: Secondary | ICD-10-CM | POA: Diagnosis not present

## 2017-07-08 DIAGNOSIS — E43 Unspecified severe protein-calorie malnutrition: Secondary | ICD-10-CM | POA: Diagnosis not present

## 2017-07-08 DIAGNOSIS — R531 Weakness: Secondary | ICD-10-CM | POA: Diagnosis not present

## 2017-07-08 DIAGNOSIS — M810 Age-related osteoporosis without current pathological fracture: Secondary | ICD-10-CM | POA: Diagnosis not present

## 2017-07-08 DIAGNOSIS — L89322 Pressure ulcer of left buttock, stage 2: Secondary | ICD-10-CM | POA: Diagnosis not present

## 2017-07-08 DIAGNOSIS — K5909 Other constipation: Secondary | ICD-10-CM | POA: Diagnosis not present

## 2017-07-10 DIAGNOSIS — M810 Age-related osteoporosis without current pathological fracture: Secondary | ICD-10-CM | POA: Diagnosis not present

## 2017-07-10 DIAGNOSIS — L89322 Pressure ulcer of left buttock, stage 2: Secondary | ICD-10-CM | POA: Diagnosis not present

## 2017-07-10 DIAGNOSIS — R531 Weakness: Secondary | ICD-10-CM | POA: Diagnosis not present

## 2017-07-10 DIAGNOSIS — E43 Unspecified severe protein-calorie malnutrition: Secondary | ICD-10-CM | POA: Diagnosis not present

## 2017-07-10 DIAGNOSIS — K5909 Other constipation: Secondary | ICD-10-CM | POA: Diagnosis not present

## 2017-07-10 DIAGNOSIS — I10 Essential (primary) hypertension: Secondary | ICD-10-CM | POA: Diagnosis not present

## 2017-07-12 DIAGNOSIS — I10 Essential (primary) hypertension: Secondary | ICD-10-CM | POA: Diagnosis not present

## 2017-07-12 DIAGNOSIS — M810 Age-related osteoporosis without current pathological fracture: Secondary | ICD-10-CM | POA: Diagnosis not present

## 2017-07-12 DIAGNOSIS — L89322 Pressure ulcer of left buttock, stage 2: Secondary | ICD-10-CM | POA: Diagnosis not present

## 2017-07-12 DIAGNOSIS — E43 Unspecified severe protein-calorie malnutrition: Secondary | ICD-10-CM | POA: Diagnosis not present

## 2017-07-12 DIAGNOSIS — R531 Weakness: Secondary | ICD-10-CM | POA: Diagnosis not present

## 2017-07-12 DIAGNOSIS — K5909 Other constipation: Secondary | ICD-10-CM | POA: Diagnosis not present

## 2017-07-14 DIAGNOSIS — E43 Unspecified severe protein-calorie malnutrition: Secondary | ICD-10-CM | POA: Diagnosis not present

## 2017-07-14 DIAGNOSIS — I10 Essential (primary) hypertension: Secondary | ICD-10-CM | POA: Diagnosis not present

## 2017-07-14 DIAGNOSIS — L89322 Pressure ulcer of left buttock, stage 2: Secondary | ICD-10-CM | POA: Diagnosis not present

## 2017-07-14 DIAGNOSIS — K5909 Other constipation: Secondary | ICD-10-CM | POA: Diagnosis not present

## 2017-07-14 DIAGNOSIS — M810 Age-related osteoporosis without current pathological fracture: Secondary | ICD-10-CM | POA: Diagnosis not present

## 2017-07-14 DIAGNOSIS — R531 Weakness: Secondary | ICD-10-CM | POA: Diagnosis not present

## 2017-07-16 DIAGNOSIS — E440 Moderate protein-calorie malnutrition: Secondary | ICD-10-CM | POA: Diagnosis not present

## 2017-07-16 DIAGNOSIS — L89323 Pressure ulcer of left buttock, stage 3: Secondary | ICD-10-CM | POA: Diagnosis not present

## 2017-07-16 DIAGNOSIS — Z993 Dependence on wheelchair: Secondary | ICD-10-CM | POA: Diagnosis not present

## 2017-07-16 DIAGNOSIS — C44202 Unspecified malignant neoplasm of skin of right ear and external auricular canal: Secondary | ICD-10-CM | POA: Diagnosis not present

## 2017-07-16 DIAGNOSIS — I10 Essential (primary) hypertension: Secondary | ICD-10-CM | POA: Diagnosis not present

## 2017-07-16 DIAGNOSIS — G629 Polyneuropathy, unspecified: Secondary | ICD-10-CM | POA: Diagnosis not present

## 2017-07-17 DIAGNOSIS — E43 Unspecified severe protein-calorie malnutrition: Secondary | ICD-10-CM | POA: Diagnosis not present

## 2017-07-17 DIAGNOSIS — K5909 Other constipation: Secondary | ICD-10-CM | POA: Diagnosis not present

## 2017-07-17 DIAGNOSIS — R531 Weakness: Secondary | ICD-10-CM | POA: Diagnosis not present

## 2017-07-17 DIAGNOSIS — I10 Essential (primary) hypertension: Secondary | ICD-10-CM | POA: Diagnosis not present

## 2017-07-17 DIAGNOSIS — L89322 Pressure ulcer of left buttock, stage 2: Secondary | ICD-10-CM | POA: Diagnosis not present

## 2017-07-17 DIAGNOSIS — M810 Age-related osteoporosis without current pathological fracture: Secondary | ICD-10-CM | POA: Diagnosis not present

## 2017-07-19 DIAGNOSIS — L89322 Pressure ulcer of left buttock, stage 2: Secondary | ICD-10-CM | POA: Diagnosis not present

## 2017-07-19 DIAGNOSIS — R531 Weakness: Secondary | ICD-10-CM | POA: Diagnosis not present

## 2017-07-19 DIAGNOSIS — E43 Unspecified severe protein-calorie malnutrition: Secondary | ICD-10-CM | POA: Diagnosis not present

## 2017-07-19 DIAGNOSIS — I10 Essential (primary) hypertension: Secondary | ICD-10-CM | POA: Diagnosis not present

## 2017-07-19 DIAGNOSIS — M810 Age-related osteoporosis without current pathological fracture: Secondary | ICD-10-CM | POA: Diagnosis not present

## 2017-07-19 DIAGNOSIS — K5909 Other constipation: Secondary | ICD-10-CM | POA: Diagnosis not present

## 2017-07-21 DIAGNOSIS — L89322 Pressure ulcer of left buttock, stage 2: Secondary | ICD-10-CM | POA: Diagnosis not present

## 2017-07-21 DIAGNOSIS — E43 Unspecified severe protein-calorie malnutrition: Secondary | ICD-10-CM | POA: Diagnosis not present

## 2017-07-21 DIAGNOSIS — R531 Weakness: Secondary | ICD-10-CM | POA: Diagnosis not present

## 2017-07-21 DIAGNOSIS — K5909 Other constipation: Secondary | ICD-10-CM | POA: Diagnosis not present

## 2017-07-21 DIAGNOSIS — M810 Age-related osteoporosis without current pathological fracture: Secondary | ICD-10-CM | POA: Diagnosis not present

## 2017-07-21 DIAGNOSIS — I10 Essential (primary) hypertension: Secondary | ICD-10-CM | POA: Diagnosis not present

## 2017-07-23 DIAGNOSIS — I10 Essential (primary) hypertension: Secondary | ICD-10-CM | POA: Diagnosis not present

## 2017-07-23 DIAGNOSIS — M810 Age-related osteoporosis without current pathological fracture: Secondary | ICD-10-CM | POA: Diagnosis not present

## 2017-07-23 DIAGNOSIS — K5909 Other constipation: Secondary | ICD-10-CM | POA: Diagnosis not present

## 2017-07-23 DIAGNOSIS — E43 Unspecified severe protein-calorie malnutrition: Secondary | ICD-10-CM | POA: Diagnosis not present

## 2017-07-23 DIAGNOSIS — R531 Weakness: Secondary | ICD-10-CM | POA: Diagnosis not present

## 2017-07-23 DIAGNOSIS — L89322 Pressure ulcer of left buttock, stage 2: Secondary | ICD-10-CM | POA: Diagnosis not present

## 2017-07-24 DIAGNOSIS — N079 Hereditary nephropathy, not elsewhere classified with unspecified morphologic lesions: Secondary | ICD-10-CM | POA: Diagnosis not present

## 2017-07-24 DIAGNOSIS — I10 Essential (primary) hypertension: Secondary | ICD-10-CM | POA: Diagnosis not present

## 2017-07-24 DIAGNOSIS — K5909 Other constipation: Secondary | ICD-10-CM | POA: Diagnosis not present

## 2017-07-24 DIAGNOSIS — L89322 Pressure ulcer of left buttock, stage 2: Secondary | ICD-10-CM | POA: Diagnosis not present

## 2017-07-24 DIAGNOSIS — M341 CR(E)ST syndrome: Secondary | ICD-10-CM | POA: Diagnosis not present

## 2017-07-24 DIAGNOSIS — Z4800 Encounter for change or removal of nonsurgical wound dressing: Secondary | ICD-10-CM | POA: Diagnosis not present

## 2017-07-24 DIAGNOSIS — Z9181 History of falling: Secondary | ICD-10-CM | POA: Diagnosis not present

## 2017-07-24 DIAGNOSIS — Z87440 Personal history of urinary (tract) infections: Secondary | ICD-10-CM | POA: Diagnosis not present

## 2017-07-24 DIAGNOSIS — M810 Age-related osteoporosis without current pathological fracture: Secondary | ICD-10-CM | POA: Diagnosis not present

## 2017-07-24 DIAGNOSIS — E43 Unspecified severe protein-calorie malnutrition: Secondary | ICD-10-CM | POA: Diagnosis not present

## 2017-07-24 DIAGNOSIS — Z993 Dependence on wheelchair: Secondary | ICD-10-CM | POA: Diagnosis not present

## 2017-07-24 DIAGNOSIS — R531 Weakness: Secondary | ICD-10-CM | POA: Diagnosis not present

## 2017-07-25 DIAGNOSIS — M810 Age-related osteoporosis without current pathological fracture: Secondary | ICD-10-CM | POA: Diagnosis not present

## 2017-07-25 DIAGNOSIS — I10 Essential (primary) hypertension: Secondary | ICD-10-CM | POA: Diagnosis not present

## 2017-07-25 DIAGNOSIS — K5909 Other constipation: Secondary | ICD-10-CM | POA: Diagnosis not present

## 2017-07-25 DIAGNOSIS — E43 Unspecified severe protein-calorie malnutrition: Secondary | ICD-10-CM | POA: Diagnosis not present

## 2017-07-25 DIAGNOSIS — L89322 Pressure ulcer of left buttock, stage 2: Secondary | ICD-10-CM | POA: Diagnosis not present

## 2017-07-25 DIAGNOSIS — R531 Weakness: Secondary | ICD-10-CM | POA: Diagnosis not present

## 2017-07-27 DIAGNOSIS — M810 Age-related osteoporosis without current pathological fracture: Secondary | ICD-10-CM | POA: Diagnosis not present

## 2017-07-27 DIAGNOSIS — R531 Weakness: Secondary | ICD-10-CM | POA: Diagnosis not present

## 2017-07-27 DIAGNOSIS — K5909 Other constipation: Secondary | ICD-10-CM | POA: Diagnosis not present

## 2017-07-27 DIAGNOSIS — I10 Essential (primary) hypertension: Secondary | ICD-10-CM | POA: Diagnosis not present

## 2017-07-27 DIAGNOSIS — L89322 Pressure ulcer of left buttock, stage 2: Secondary | ICD-10-CM | POA: Diagnosis not present

## 2017-07-27 DIAGNOSIS — E43 Unspecified severe protein-calorie malnutrition: Secondary | ICD-10-CM | POA: Diagnosis not present

## 2017-07-28 DIAGNOSIS — K5909 Other constipation: Secondary | ICD-10-CM | POA: Diagnosis not present

## 2017-07-28 DIAGNOSIS — L89322 Pressure ulcer of left buttock, stage 2: Secondary | ICD-10-CM | POA: Diagnosis not present

## 2017-07-28 DIAGNOSIS — M810 Age-related osteoporosis without current pathological fracture: Secondary | ICD-10-CM | POA: Diagnosis not present

## 2017-07-28 DIAGNOSIS — E43 Unspecified severe protein-calorie malnutrition: Secondary | ICD-10-CM | POA: Diagnosis not present

## 2017-07-28 DIAGNOSIS — I10 Essential (primary) hypertension: Secondary | ICD-10-CM | POA: Diagnosis not present

## 2017-07-28 DIAGNOSIS — R531 Weakness: Secondary | ICD-10-CM | POA: Diagnosis not present

## 2017-07-30 DIAGNOSIS — M810 Age-related osteoporosis without current pathological fracture: Secondary | ICD-10-CM | POA: Diagnosis not present

## 2017-07-30 DIAGNOSIS — L89322 Pressure ulcer of left buttock, stage 2: Secondary | ICD-10-CM | POA: Diagnosis not present

## 2017-07-30 DIAGNOSIS — K5909 Other constipation: Secondary | ICD-10-CM | POA: Diagnosis not present

## 2017-07-30 DIAGNOSIS — E43 Unspecified severe protein-calorie malnutrition: Secondary | ICD-10-CM | POA: Diagnosis not present

## 2017-07-30 DIAGNOSIS — R531 Weakness: Secondary | ICD-10-CM | POA: Diagnosis not present

## 2017-07-30 DIAGNOSIS — I10 Essential (primary) hypertension: Secondary | ICD-10-CM | POA: Diagnosis not present

## 2017-07-31 ENCOUNTER — Encounter (HOSPITAL_BASED_OUTPATIENT_CLINIC_OR_DEPARTMENT_OTHER): Payer: Medicare Other | Attending: Internal Medicine

## 2017-07-31 DIAGNOSIS — L89323 Pressure ulcer of left buttock, stage 3: Secondary | ICD-10-CM | POA: Insufficient documentation

## 2017-07-31 DIAGNOSIS — E440 Moderate protein-calorie malnutrition: Secondary | ICD-10-CM | POA: Diagnosis not present

## 2017-07-31 DIAGNOSIS — G629 Polyneuropathy, unspecified: Secondary | ICD-10-CM | POA: Diagnosis not present

## 2017-07-31 DIAGNOSIS — C44222 Squamous cell carcinoma of skin of right ear and external auricular canal: Secondary | ICD-10-CM | POA: Insufficient documentation

## 2017-07-31 DIAGNOSIS — I10 Essential (primary) hypertension: Secondary | ICD-10-CM | POA: Diagnosis not present

## 2017-07-31 DIAGNOSIS — Z993 Dependence on wheelchair: Secondary | ICD-10-CM | POA: Insufficient documentation

## 2017-07-31 DIAGNOSIS — S01301A Unspecified open wound of right ear, initial encounter: Secondary | ICD-10-CM | POA: Diagnosis not present

## 2017-08-01 DIAGNOSIS — M810 Age-related osteoporosis without current pathological fracture: Secondary | ICD-10-CM | POA: Diagnosis not present

## 2017-08-01 DIAGNOSIS — K5909 Other constipation: Secondary | ICD-10-CM | POA: Diagnosis not present

## 2017-08-01 DIAGNOSIS — L89322 Pressure ulcer of left buttock, stage 2: Secondary | ICD-10-CM | POA: Diagnosis not present

## 2017-08-01 DIAGNOSIS — I10 Essential (primary) hypertension: Secondary | ICD-10-CM | POA: Diagnosis not present

## 2017-08-01 DIAGNOSIS — E43 Unspecified severe protein-calorie malnutrition: Secondary | ICD-10-CM | POA: Diagnosis not present

## 2017-08-01 DIAGNOSIS — R531 Weakness: Secondary | ICD-10-CM | POA: Diagnosis not present

## 2017-08-03 DIAGNOSIS — K5909 Other constipation: Secondary | ICD-10-CM | POA: Diagnosis not present

## 2017-08-03 DIAGNOSIS — I10 Essential (primary) hypertension: Secondary | ICD-10-CM | POA: Diagnosis not present

## 2017-08-03 DIAGNOSIS — M810 Age-related osteoporosis without current pathological fracture: Secondary | ICD-10-CM | POA: Diagnosis not present

## 2017-08-03 DIAGNOSIS — E43 Unspecified severe protein-calorie malnutrition: Secondary | ICD-10-CM | POA: Diagnosis not present

## 2017-08-03 DIAGNOSIS — L89322 Pressure ulcer of left buttock, stage 2: Secondary | ICD-10-CM | POA: Diagnosis not present

## 2017-08-03 DIAGNOSIS — R531 Weakness: Secondary | ICD-10-CM | POA: Diagnosis not present

## 2017-08-05 DIAGNOSIS — L89322 Pressure ulcer of left buttock, stage 2: Secondary | ICD-10-CM | POA: Diagnosis not present

## 2017-08-05 DIAGNOSIS — R531 Weakness: Secondary | ICD-10-CM | POA: Diagnosis not present

## 2017-08-05 DIAGNOSIS — K5909 Other constipation: Secondary | ICD-10-CM | POA: Diagnosis not present

## 2017-08-05 DIAGNOSIS — I10 Essential (primary) hypertension: Secondary | ICD-10-CM | POA: Diagnosis not present

## 2017-08-05 DIAGNOSIS — M810 Age-related osteoporosis without current pathological fracture: Secondary | ICD-10-CM | POA: Diagnosis not present

## 2017-08-05 DIAGNOSIS — E43 Unspecified severe protein-calorie malnutrition: Secondary | ICD-10-CM | POA: Diagnosis not present

## 2017-08-07 DIAGNOSIS — I10 Essential (primary) hypertension: Secondary | ICD-10-CM | POA: Diagnosis not present

## 2017-08-07 DIAGNOSIS — R531 Weakness: Secondary | ICD-10-CM | POA: Diagnosis not present

## 2017-08-07 DIAGNOSIS — L89322 Pressure ulcer of left buttock, stage 2: Secondary | ICD-10-CM | POA: Diagnosis not present

## 2017-08-07 DIAGNOSIS — M810 Age-related osteoporosis without current pathological fracture: Secondary | ICD-10-CM | POA: Diagnosis not present

## 2017-08-07 DIAGNOSIS — K5909 Other constipation: Secondary | ICD-10-CM | POA: Diagnosis not present

## 2017-08-07 DIAGNOSIS — E43 Unspecified severe protein-calorie malnutrition: Secondary | ICD-10-CM | POA: Diagnosis not present

## 2017-08-09 DIAGNOSIS — L89322 Pressure ulcer of left buttock, stage 2: Secondary | ICD-10-CM | POA: Diagnosis not present

## 2017-08-09 DIAGNOSIS — R531 Weakness: Secondary | ICD-10-CM | POA: Diagnosis not present

## 2017-08-09 DIAGNOSIS — K5909 Other constipation: Secondary | ICD-10-CM | POA: Diagnosis not present

## 2017-08-09 DIAGNOSIS — E43 Unspecified severe protein-calorie malnutrition: Secondary | ICD-10-CM | POA: Diagnosis not present

## 2017-08-09 DIAGNOSIS — M810 Age-related osteoporosis without current pathological fracture: Secondary | ICD-10-CM | POA: Diagnosis not present

## 2017-08-09 DIAGNOSIS — I10 Essential (primary) hypertension: Secondary | ICD-10-CM | POA: Diagnosis not present

## 2017-08-11 DIAGNOSIS — M810 Age-related osteoporosis without current pathological fracture: Secondary | ICD-10-CM | POA: Diagnosis not present

## 2017-08-11 DIAGNOSIS — K5909 Other constipation: Secondary | ICD-10-CM | POA: Diagnosis not present

## 2017-08-11 DIAGNOSIS — R531 Weakness: Secondary | ICD-10-CM | POA: Diagnosis not present

## 2017-08-11 DIAGNOSIS — E43 Unspecified severe protein-calorie malnutrition: Secondary | ICD-10-CM | POA: Diagnosis not present

## 2017-08-11 DIAGNOSIS — L89322 Pressure ulcer of left buttock, stage 2: Secondary | ICD-10-CM | POA: Diagnosis not present

## 2017-08-11 DIAGNOSIS — I10 Essential (primary) hypertension: Secondary | ICD-10-CM | POA: Diagnosis not present

## 2017-08-13 DIAGNOSIS — K5909 Other constipation: Secondary | ICD-10-CM | POA: Diagnosis not present

## 2017-08-13 DIAGNOSIS — R531 Weakness: Secondary | ICD-10-CM | POA: Diagnosis not present

## 2017-08-13 DIAGNOSIS — I10 Essential (primary) hypertension: Secondary | ICD-10-CM | POA: Diagnosis not present

## 2017-08-13 DIAGNOSIS — M810 Age-related osteoporosis without current pathological fracture: Secondary | ICD-10-CM | POA: Diagnosis not present

## 2017-08-13 DIAGNOSIS — L89322 Pressure ulcer of left buttock, stage 2: Secondary | ICD-10-CM | POA: Diagnosis not present

## 2017-08-13 DIAGNOSIS — E43 Unspecified severe protein-calorie malnutrition: Secondary | ICD-10-CM | POA: Diagnosis not present

## 2017-08-14 DIAGNOSIS — I10 Essential (primary) hypertension: Secondary | ICD-10-CM | POA: Diagnosis not present

## 2017-08-14 DIAGNOSIS — Z993 Dependence on wheelchair: Secondary | ICD-10-CM | POA: Diagnosis not present

## 2017-08-14 DIAGNOSIS — E440 Moderate protein-calorie malnutrition: Secondary | ICD-10-CM | POA: Diagnosis not present

## 2017-08-14 DIAGNOSIS — L89323 Pressure ulcer of left buttock, stage 3: Secondary | ICD-10-CM | POA: Diagnosis not present

## 2017-08-14 DIAGNOSIS — G629 Polyneuropathy, unspecified: Secondary | ICD-10-CM | POA: Diagnosis not present

## 2017-08-14 DIAGNOSIS — C44222 Squamous cell carcinoma of skin of right ear and external auricular canal: Secondary | ICD-10-CM | POA: Diagnosis not present

## 2017-08-14 DIAGNOSIS — S01301A Unspecified open wound of right ear, initial encounter: Secondary | ICD-10-CM | POA: Diagnosis not present

## 2017-08-15 DIAGNOSIS — I10 Essential (primary) hypertension: Secondary | ICD-10-CM | POA: Diagnosis not present

## 2017-08-15 DIAGNOSIS — E43 Unspecified severe protein-calorie malnutrition: Secondary | ICD-10-CM | POA: Diagnosis not present

## 2017-08-15 DIAGNOSIS — M810 Age-related osteoporosis without current pathological fracture: Secondary | ICD-10-CM | POA: Diagnosis not present

## 2017-08-15 DIAGNOSIS — L89322 Pressure ulcer of left buttock, stage 2: Secondary | ICD-10-CM | POA: Diagnosis not present

## 2017-08-15 DIAGNOSIS — R531 Weakness: Secondary | ICD-10-CM | POA: Diagnosis not present

## 2017-08-15 DIAGNOSIS — K5909 Other constipation: Secondary | ICD-10-CM | POA: Diagnosis not present

## 2017-08-17 DIAGNOSIS — L89322 Pressure ulcer of left buttock, stage 2: Secondary | ICD-10-CM | POA: Diagnosis not present

## 2017-08-17 DIAGNOSIS — M810 Age-related osteoporosis without current pathological fracture: Secondary | ICD-10-CM | POA: Diagnosis not present

## 2017-08-17 DIAGNOSIS — E43 Unspecified severe protein-calorie malnutrition: Secondary | ICD-10-CM | POA: Diagnosis not present

## 2017-08-17 DIAGNOSIS — I10 Essential (primary) hypertension: Secondary | ICD-10-CM | POA: Diagnosis not present

## 2017-08-17 DIAGNOSIS — K5909 Other constipation: Secondary | ICD-10-CM | POA: Diagnosis not present

## 2017-08-17 DIAGNOSIS — R531 Weakness: Secondary | ICD-10-CM | POA: Diagnosis not present

## 2017-08-19 ENCOUNTER — Encounter (HOSPITAL_COMMUNITY): Payer: Self-pay | Admitting: Emergency Medicine

## 2017-08-19 ENCOUNTER — Inpatient Hospital Stay (HOSPITAL_COMMUNITY): Payer: Medicare Other | Admitting: Certified Registered Nurse Anesthetist

## 2017-08-19 ENCOUNTER — Emergency Department (HOSPITAL_COMMUNITY): Payer: Medicare Other

## 2017-08-19 ENCOUNTER — Inpatient Hospital Stay (HOSPITAL_COMMUNITY)
Admission: EM | Admit: 2017-08-19 | Discharge: 2017-08-23 | DRG: 480 | Disposition: A | Discharge status: Skilled Nursing Facility | Payer: Medicare Other | Attending: Internal Medicine | Admitting: Internal Medicine

## 2017-08-19 ENCOUNTER — Encounter (HOSPITAL_COMMUNITY)
Admission: EM | Disposition: A | Discharge status: Skilled Nursing Facility | Payer: Self-pay | Source: Home / Self Care | Attending: Internal Medicine

## 2017-08-19 ENCOUNTER — Inpatient Hospital Stay (HOSPITAL_COMMUNITY): Payer: Medicare Other

## 2017-08-19 DIAGNOSIS — M6389 Disorders of muscle in diseases classified elsewhere, multiple sites: Secondary | ICD-10-CM | POA: Diagnosis not present

## 2017-08-19 DIAGNOSIS — E43 Unspecified severe protein-calorie malnutrition: Secondary | ICD-10-CM | POA: Diagnosis not present

## 2017-08-19 DIAGNOSIS — W010XXA Fall on same level from slipping, tripping and stumbling without subsequent striking against object, initial encounter: Secondary | ICD-10-CM | POA: Diagnosis present

## 2017-08-19 DIAGNOSIS — Z87891 Personal history of nicotine dependence: Secondary | ICD-10-CM | POA: Diagnosis not present

## 2017-08-19 DIAGNOSIS — M341 CR(E)ST syndrome: Secondary | ICD-10-CM | POA: Diagnosis present

## 2017-08-19 DIAGNOSIS — S72001K Fracture of unspecified part of neck of right femur, subsequent encounter for closed fracture with nonunion: Secondary | ICD-10-CM | POA: Diagnosis not present

## 2017-08-19 DIAGNOSIS — S72141A Displaced intertrochanteric fracture of right femur, initial encounter for closed fracture: Secondary | ICD-10-CM | POA: Diagnosis not present

## 2017-08-19 DIAGNOSIS — R5381 Other malaise: Secondary | ICD-10-CM | POA: Diagnosis not present

## 2017-08-19 DIAGNOSIS — Z419 Encounter for procedure for purposes other than remedying health state, unspecified: Secondary | ICD-10-CM

## 2017-08-19 DIAGNOSIS — R1314 Dysphagia, pharyngoesophageal phase: Secondary | ICD-10-CM

## 2017-08-19 DIAGNOSIS — R1312 Dysphagia, oropharyngeal phase: Secondary | ICD-10-CM | POA: Diagnosis not present

## 2017-08-19 DIAGNOSIS — E46 Unspecified protein-calorie malnutrition: Secondary | ICD-10-CM | POA: Diagnosis not present

## 2017-08-19 DIAGNOSIS — S79911A Unspecified injury of right hip, initial encounter: Secondary | ICD-10-CM | POA: Diagnosis not present

## 2017-08-19 DIAGNOSIS — T402X5A Adverse effect of other opioids, initial encounter: Secondary | ICD-10-CM | POA: Diagnosis not present

## 2017-08-19 DIAGNOSIS — R52 Pain, unspecified: Secondary | ICD-10-CM

## 2017-08-19 DIAGNOSIS — R1310 Dysphagia, unspecified: Secondary | ICD-10-CM

## 2017-08-19 DIAGNOSIS — E876 Hypokalemia: Secondary | ICD-10-CM | POA: Diagnosis present

## 2017-08-19 DIAGNOSIS — Z8739 Personal history of other diseases of the musculoskeletal system and connective tissue: Secondary | ICD-10-CM

## 2017-08-19 DIAGNOSIS — Z66 Do not resuscitate: Secondary | ICD-10-CM | POA: Diagnosis present

## 2017-08-19 DIAGNOSIS — R41841 Cognitive communication deficit: Secondary | ICD-10-CM | POA: Diagnosis not present

## 2017-08-19 DIAGNOSIS — Z993 Dependence on wheelchair: Secondary | ICD-10-CM | POA: Diagnosis not present

## 2017-08-19 DIAGNOSIS — M25551 Pain in right hip: Secondary | ICD-10-CM | POA: Diagnosis not present

## 2017-08-19 DIAGNOSIS — G8911 Acute pain due to trauma: Secondary | ICD-10-CM | POA: Diagnosis not present

## 2017-08-19 DIAGNOSIS — I110 Hypertensive heart disease with heart failure: Secondary | ICD-10-CM | POA: Diagnosis present

## 2017-08-19 DIAGNOSIS — W19XXXA Unspecified fall, initial encounter: Secondary | ICD-10-CM | POA: Diagnosis present

## 2017-08-19 DIAGNOSIS — Z90710 Acquired absence of both cervix and uterus: Secondary | ICD-10-CM | POA: Diagnosis not present

## 2017-08-19 DIAGNOSIS — J690 Pneumonitis due to inhalation of food and vomit: Secondary | ICD-10-CM | POA: Diagnosis not present

## 2017-08-19 DIAGNOSIS — R4182 Altered mental status, unspecified: Secondary | ICD-10-CM | POA: Diagnosis not present

## 2017-08-19 DIAGNOSIS — I5032 Chronic diastolic (congestive) heart failure: Secondary | ICD-10-CM | POA: Diagnosis present

## 2017-08-19 DIAGNOSIS — Y92092 Bedroom in other non-institutional residence as the place of occurrence of the external cause: Secondary | ICD-10-CM | POA: Diagnosis not present

## 2017-08-19 DIAGNOSIS — Z515 Encounter for palliative care: Secondary | ICD-10-CM

## 2017-08-19 DIAGNOSIS — K5903 Drug induced constipation: Secondary | ICD-10-CM | POA: Diagnosis not present

## 2017-08-19 DIAGNOSIS — M810 Age-related osteoporosis without current pathological fracture: Secondary | ICD-10-CM | POA: Diagnosis present

## 2017-08-19 DIAGNOSIS — R5383 Other fatigue: Secondary | ICD-10-CM | POA: Diagnosis not present

## 2017-08-19 DIAGNOSIS — S72001D Fracture of unspecified part of neck of right femur, subsequent encounter for closed fracture with routine healing: Secondary | ICD-10-CM | POA: Diagnosis not present

## 2017-08-19 DIAGNOSIS — T148XXA Other injury of unspecified body region, initial encounter: Secondary | ICD-10-CM | POA: Diagnosis not present

## 2017-08-19 DIAGNOSIS — Z681 Body mass index (BMI) 19 or less, adult: Secondary | ICD-10-CM

## 2017-08-19 DIAGNOSIS — J9811 Atelectasis: Secondary | ICD-10-CM | POA: Diagnosis not present

## 2017-08-19 DIAGNOSIS — S72001A Fracture of unspecified part of neck of right femur, initial encounter for closed fracture: Secondary | ICD-10-CM | POA: Diagnosis not present

## 2017-08-19 DIAGNOSIS — R109 Unspecified abdominal pain: Secondary | ICD-10-CM | POA: Diagnosis not present

## 2017-08-19 DIAGNOSIS — J9601 Acute respiratory failure with hypoxia: Secondary | ICD-10-CM | POA: Diagnosis not present

## 2017-08-19 DIAGNOSIS — I10 Essential (primary) hypertension: Secondary | ICD-10-CM | POA: Diagnosis present

## 2017-08-19 DIAGNOSIS — N390 Urinary tract infection, site not specified: Secondary | ICD-10-CM | POA: Diagnosis not present

## 2017-08-19 DIAGNOSIS — R05 Cough: Secondary | ICD-10-CM | POA: Diagnosis not present

## 2017-08-19 DIAGNOSIS — Z7189 Other specified counseling: Secondary | ICD-10-CM

## 2017-08-19 DIAGNOSIS — M6281 Muscle weakness (generalized): Secondary | ICD-10-CM | POA: Diagnosis not present

## 2017-08-19 DIAGNOSIS — R2681 Unsteadiness on feet: Secondary | ICD-10-CM | POA: Diagnosis not present

## 2017-08-19 HISTORY — DX: Unspecified protein-calorie malnutrition: E46

## 2017-08-19 HISTORY — PX: INTRAMEDULLARY (IM) NAIL INTERTROCHANTERIC: SHX5875

## 2017-08-19 LAB — BASIC METABOLIC PANEL
ANION GAP: 11 (ref 5–15)
Anion gap: 10 (ref 5–15)
BUN: 21 mg/dL — AB (ref 6–20)
BUN: 20 mg/dL (ref 6–20)
CHLORIDE: 104 mmol/L (ref 101–111)
CO2: 26 mmol/L (ref 22–32)
CO2: 26 mmol/L (ref 22–32)
Calcium: 8.5 mg/dL — ABNORMAL LOW (ref 8.9–10.3)
Calcium: 8.5 mg/dL — ABNORMAL LOW (ref 8.9–10.3)
Chloride: 104 mmol/L (ref 101–111)
Creatinine, Ser: 0.84 mg/dL (ref 0.44–1.00)
Creatinine, Ser: 0.77 mg/dL (ref 0.44–1.00)
GFR calc Af Amer: >60 mL/min (ref >60)
GFR calc Af Amer: >60 mL/min (ref >60)
GLUCOSE: 102 mg/dL — AB (ref 65–99)
Glucose, Bld: 111 mg/dL — ABNORMAL HIGH (ref 65–99)
POTASSIUM: 3.4 mmol/L — AB (ref 3.5–5.1)
POTASSIUM: 3.5 mmol/L (ref 3.5–5.1)
SODIUM: 140 mmol/L (ref 135–145)
Sodium: 141 mmol/L (ref 135–145)

## 2017-08-19 LAB — CBC
HCT: 37.1 % (ref 36.0–46.0)
HEMOGLOBIN: 11.5 g/dL — AB (ref 12.0–15.0)
MCH: 27.2 pg (ref 26.0–34.0)
MCHC: 31 g/dL (ref 30.0–36.0)
MCV: 87.7 fL (ref 78.0–100.0)
PLATELETS: 276 10*3/uL (ref 150–400)
RBC: 4.23 MIL/uL (ref 3.87–5.11)
RDW: 16.4 % — ABNORMAL HIGH (ref 11.5–15.5)
WBC: 11.1 10*3/uL — AB (ref 4.0–10.5)

## 2017-08-19 LAB — TYPE AND SCREEN
ABO/RH(D): A POS
Antibody Screen: NEGATIVE

## 2017-08-19 LAB — CBC WITH DIFFERENTIAL/PLATELET
BASOS ABS: 0 10*3/uL (ref 0.0–0.1)
BASOS PCT: 0 %
EOS ABS: 0.2 10*3/uL (ref 0.0–0.7)
Eosinophils Relative: 2 %
HCT: 37.5 % (ref 36.0–46.0)
Hemoglobin: 11.8 g/dL — ABNORMAL LOW (ref 12.0–15.0)
LYMPHS ABS: 0.9 10*3/uL (ref 0.7–4.0)
Lymphocytes Relative: 9 %
MCH: 27.6 pg (ref 26.0–34.0)
MCHC: 31.5 g/dL (ref 30.0–36.0)
MCV: 87.6 fL (ref 78.0–100.0)
Monocytes Absolute: 0.5 10*3/uL (ref 0.1–1.0)
Monocytes Relative: 5 %
Neutro Abs: 8 10*3/uL — ABNORMAL HIGH (ref 1.7–7.7)
Neutrophils Relative %: 84 %
PLATELETS: 256 10*3/uL (ref 150–400)
RBC: 4.28 MIL/uL (ref 3.87–5.11)
RDW: 16.5 % — ABNORMAL HIGH (ref 11.5–15.5)
WBC: 9.5 10*3/uL (ref 4.0–10.5)

## 2017-08-19 LAB — PROTIME-INR
INR: 0.98
PROTHROMBIN TIME: 12.9 s (ref 11.4–15.2)

## 2017-08-19 LAB — ABO/RH: ABO/RH(D): A POS

## 2017-08-19 LAB — APTT: aPTT: 28 seconds (ref 24–36)

## 2017-08-19 SURGERY — FIXATION, FRACTURE, INTERTROCHANTERIC, WITH INTRAMEDULLARY ROD
Anesthesia: General | Site: Hip | Laterality: Right

## 2017-08-19 MED ORDER — SODIUM CHLORIDE 0.9 % IV SOLN
INTRAVENOUS | Status: DC
  Administered 2017-08-19: 19:00:00 via INTRAVENOUS

## 2017-08-19 MED ORDER — ACETAMINOPHEN 500 MG PO TABS
500.0000 mg | ORAL_TABLET | Freq: Four times a day (QID) | ORAL | Status: DC | PRN
  Administered 2017-08-19: 500 mg via ORAL
  Filled 2017-08-19 (×2): qty 1

## 2017-08-19 MED ORDER — MORPHINE SULFATE (PF) 4 MG/ML IV SOLN
INTRAVENOUS | Status: AC
  Filled 2017-08-19: qty 1

## 2017-08-19 MED ORDER — PHENYLEPHRINE HCL 10 MG/ML IJ SOLN
INTRAVENOUS | Status: DC | PRN
  Administered 2017-08-19: 25 ug/min via INTRAVENOUS

## 2017-08-19 MED ORDER — LIDOCAINE 2% (20 MG/ML) 5 ML SYRINGE
INTRAMUSCULAR | Status: DC | PRN
  Administered 2017-08-19: 40 mg via INTRAVENOUS

## 2017-08-19 MED ORDER — ZOLPIDEM TARTRATE 5 MG PO TABS
5.0000 mg | ORAL_TABLET | Freq: Every day | ORAL | Status: DC
Start: 2017-08-19 — End: 2017-08-23
  Administered 2017-08-19 – 2017-08-22 (×4): 5 mg via ORAL
  Filled 2017-08-19 (×5): qty 1

## 2017-08-19 MED ORDER — METHOCARBAMOL 500 MG PO TABS
500.0000 mg | ORAL_TABLET | Freq: Three times a day (TID) | ORAL | Status: DC | PRN
  Administered 2017-08-19 – 2017-08-21 (×4): 500 mg via ORAL
  Filled 2017-08-19 (×4): qty 1

## 2017-08-19 MED ORDER — HYDRALAZINE HCL 20 MG/ML IJ SOLN: 5.0000 mg | INTRAMUSCULAR | Status: DC | PRN

## 2017-08-19 MED ORDER — CEFAZOLIN SODIUM-DEXTROSE 2-4 GM/100ML-% IV SOLN
2.0000 g | INTRAVENOUS | Status: DC
  Filled 2017-08-19 (×2): qty 100

## 2017-08-19 MED ORDER — AMLODIPINE BESYLATE 2.5 MG PO TABS
2.5000 mg | ORAL_TABLET | Freq: Every day | ORAL | Status: DC
  Administered 2017-08-19 – 2017-08-23 (×5): 2.5 mg via ORAL
  Filled 2017-08-19 (×5): qty 1

## 2017-08-19 MED ORDER — OXYCODONE-ACETAMINOPHEN 5-325 MG PO TABS
1.0000 | ORAL_TABLET | ORAL | Status: DC | PRN
  Administered 2017-08-19 – 2017-08-21 (×4): 1 via ORAL
  Filled 2017-08-19 (×5): qty 1

## 2017-08-19 MED ORDER — 0.9 % SODIUM CHLORIDE (POUR BTL) OPTIME
TOPICAL | Status: DC | PRN
Start: 2017-08-19 — End: 2017-08-19
  Administered 2017-08-19: 1000 mL

## 2017-08-19 MED ORDER — SUGAMMADEX SODIUM 200 MG/2ML IV SOLN
INTRAVENOUS | Status: AC
  Filled 2017-08-19: qty 2

## 2017-08-19 MED ORDER — MORPHINE SULFATE (PF) 4 MG/ML IV SOLN: 0.5000 mg | INTRAVENOUS | Status: DC | PRN

## 2017-08-19 MED ORDER — SENNOSIDES-DOCUSATE SODIUM 8.6-50 MG PO TABS
1.0000 | ORAL_TABLET | Freq: Two times a day (BID) | ORAL | Status: DC
  Administered 2017-08-19 – 2017-08-23 (×9): 1 via ORAL
  Filled 2017-08-19 (×10): qty 1

## 2017-08-19 MED ORDER — SUGAMMADEX SODIUM 200 MG/2ML IV SOLN
INTRAVENOUS | Status: DC | PRN
  Administered 2017-08-19: 100 mg via INTRAVENOUS

## 2017-08-19 MED ORDER — MORPHINE SULFATE (PF) 4 MG/ML IV SOLN
1.0000 mg | INTRAVENOUS | Status: DC | PRN
  Administered 2017-08-19: 2 mg via INTRAVENOUS
  Administered 2017-08-19 (×2): 1 mg via INTRAVENOUS

## 2017-08-19 MED ORDER — LACTATED RINGERS IV SOLN
INTRAVENOUS | Status: DC
  Administered 2017-08-19: 17:00:00 via INTRAVENOUS

## 2017-08-19 MED ORDER — DEXAMETHASONE SODIUM PHOSPHATE 10 MG/ML IJ SOLN
INTRAMUSCULAR | Status: AC
  Filled 2017-08-19: qty 1

## 2017-08-19 MED ORDER — DEXAMETHASONE SODIUM PHOSPHATE 10 MG/ML IJ SOLN
INTRAMUSCULAR | Status: DC | PRN
  Administered 2017-08-19: 5 mg via INTRAVENOUS

## 2017-08-19 MED ORDER — FLUTICASONE PROPIONATE 50 MCG/ACT NA SUSP
2.0000 | Freq: Every day | NASAL | Status: DC
  Administered 2017-08-19 – 2017-08-23 (×5): 2 via NASAL
  Filled 2017-08-19 (×2): qty 16

## 2017-08-19 MED ORDER — CHLORHEXIDINE GLUCONATE 4 % EX LIQD: 60.0000 mL | Freq: Once | CUTANEOUS | Status: DC

## 2017-08-19 MED ORDER — PROPOFOL 10 MG/ML IV BOLUS
INTRAVENOUS | Status: DC | PRN
  Administered 2017-08-19: 100 mg via INTRAVENOUS

## 2017-08-19 MED ORDER — LACTATED RINGERS IV SOLN
INTRAVENOUS | Status: DC | PRN
  Administered 2017-08-19: 17:00:00 via INTRAVENOUS

## 2017-08-19 MED ORDER — CEFAZOLIN SODIUM-DEXTROSE 2-3 GM-%(50ML) IV SOLR
INTRAVENOUS | Status: DC | PRN
  Administered 2017-08-19: 2 g via INTRAVENOUS

## 2017-08-19 MED ORDER — ONDANSETRON HCL 4 MG/2ML IJ SOLN
INTRAMUSCULAR | Status: DC | PRN
  Administered 2017-08-19: 4 mg via INTRAVENOUS

## 2017-08-19 MED ORDER — FENTANYL CITRATE (PF) 250 MCG/5ML IJ SOLN
INTRAMUSCULAR | Status: AC
  Filled 2017-08-19: qty 5

## 2017-08-19 MED ORDER — PHENYLEPHRINE HCL 10 MG/ML IJ SOLN
INTRAMUSCULAR | Status: DC | PRN
  Administered 2017-08-19: 120 ug via INTRAVENOUS
  Administered 2017-08-19: 40 ug via INTRAVENOUS
  Administered 2017-08-19: 120 ug via INTRAVENOUS
  Administered 2017-08-19: 80 ug via INTRAVENOUS

## 2017-08-19 MED ORDER — POLYETHYLENE GLYCOL 3350 17 G PO PACK: 17.0000 g | PACK | Freq: Every day | ORAL | Status: DC | PRN

## 2017-08-19 MED ORDER — POLYVINYL ALCOHOL 1.4 % OP SOLN
1.0000 [drp] | Freq: Three times a day (TID) | OPHTHALMIC | Status: DC
  Administered 2017-08-19 – 2017-08-23 (×10): 1 [drp] via OPHTHALMIC
  Filled 2017-08-19 (×2): qty 15

## 2017-08-19 MED ORDER — PROPOFOL 10 MG/ML IV BOLUS
INTRAVENOUS | Status: AC
  Filled 2017-08-19: qty 20

## 2017-08-19 MED ORDER — POTASSIUM CHLORIDE 20 MEQ/15ML (10%) PO SOLN
20.0000 meq | Freq: Once | ORAL | Status: AC
  Administered 2017-08-19: 20 meq via ORAL
  Filled 2017-08-19: qty 15

## 2017-08-19 MED ORDER — PSYLLIUM 95 % PO PACK
1.0000 | PACK | Freq: Every day | ORAL | Status: DC
  Administered 2017-08-21 – 2017-08-23 (×3): 1 via ORAL
  Filled 2017-08-19 (×5): qty 1

## 2017-08-19 MED ORDER — LORATADINE 10 MG PO TABS: 10.0000 mg | ORAL_TABLET | Freq: Every day | ORAL | Status: DC | PRN

## 2017-08-19 MED ORDER — CRANBERRY 450 MG PO CAPS: 1.0000 | ORAL_CAPSULE | Freq: Two times a day (BID) | ORAL | Status: DC

## 2017-08-19 MED ORDER — ONDANSETRON HCL 4 MG/2ML IJ SOLN
INTRAMUSCULAR | Status: AC
  Filled 2017-08-19: qty 2

## 2017-08-19 MED ORDER — VITAMIN D 1000 UNITS PO TABS
1000.0000 [IU] | ORAL_TABLET | Freq: Every day | ORAL | Status: DC
  Administered 2017-08-19 – 2017-08-23 (×5): 1000 [IU] via ORAL
  Filled 2017-08-19 (×5): qty 1

## 2017-08-19 MED ORDER — PROMETHAZINE HCL 25 MG/ML IJ SOLN: 6.2500 mg | INTRAMUSCULAR | Status: DC | PRN

## 2017-08-19 MED ORDER — HYDROXYZINE HCL 10 MG PO TABS
10.0000 mg | ORAL_TABLET | Freq: Three times a day (TID) | ORAL | Status: DC | PRN
  Filled 2017-08-19: qty 1

## 2017-08-19 MED ORDER — FENTANYL CITRATE (PF) 250 MCG/5ML IJ SOLN
INTRAMUSCULAR | Status: DC | PRN
  Administered 2017-08-19 (×2): 75 ug via INTRAVENOUS

## 2017-08-19 MED ORDER — ROCURONIUM BROMIDE 10 MG/ML (PF) SYRINGE
PREFILLED_SYRINGE | INTRAVENOUS | Status: DC | PRN
  Administered 2017-08-19: 40 mg via INTRAVENOUS

## 2017-08-19 MED ORDER — POVIDONE-IODINE 10 % EX SWAB: 2.0000 "application " | Freq: Once | CUTANEOUS | Status: DC

## 2017-08-19 SURGICAL SUPPLY — 44 items
BLADE SURG 15 STRL LF DISP TIS (BLADE) ×1 IMPLANT
BLADE SURG 15 STRL SS (BLADE) ×2
BNDG GAUZE ELAST 4 BULKY (GAUZE/BANDAGES/DRESSINGS) ×3 IMPLANT
COVER PERINEAL POST (MISCELLANEOUS) ×3 IMPLANT
COVER SURGICAL LIGHT HANDLE (MISCELLANEOUS) ×3 IMPLANT
DRAPE STERI IOBAN 125X83 (DRAPES) ×3 IMPLANT
DRSG MEPILEX BORDER 4X4 (GAUZE/BANDAGES/DRESSINGS) ×3 IMPLANT
DRSG MEPILEX BORDER 4X8 (GAUZE/BANDAGES/DRESSINGS) ×3 IMPLANT
DRSG PAD ABDOMINAL 8X10 ST (GAUZE/BANDAGES/DRESSINGS) ×6 IMPLANT
DURAPREP 26ML APPLICATOR (WOUND CARE) ×3 IMPLANT
ELECT REM PT RETURN 9FT ADLT (ELECTROSURGICAL) ×3
ELECTRODE REM PT RTRN 9FT ADLT (ELECTROSURGICAL) ×1 IMPLANT
FACESHIELD WRAPAROUND (MASK) ×3 IMPLANT
GAUZE XEROFORM 5X9 LF (GAUZE/BANDAGES/DRESSINGS) ×3 IMPLANT
GLOVE BIO SURGEON STRL SZ8 (GLOVE) ×3 IMPLANT
GLOVE BIOGEL PI IND STRL 8 (GLOVE) ×1 IMPLANT
GLOVE BIOGEL PI INDICATOR 8 (GLOVE) ×2
GLOVE ORTHO TXT STRL SZ7.5 (GLOVE) ×3 IMPLANT
GOWN STRL REUS W/ TWL LRG LVL3 (GOWN DISPOSABLE) ×1 IMPLANT
GOWN STRL REUS W/ TWL XL LVL3 (GOWN DISPOSABLE) ×2 IMPLANT
GOWN STRL REUS W/TWL LRG LVL3 (GOWN DISPOSABLE) ×2
GOWN STRL REUS W/TWL XL LVL3 (GOWN DISPOSABLE) ×4
GUIDE PIN 3.2X343 (PIN) ×1
GUIDE PIN 3.2X343MM (PIN) ×2
KIT BASIN OR (CUSTOM PROCEDURE TRAY) ×3 IMPLANT
KIT ROOM TURNOVER OR (KITS) ×3 IMPLANT
LINER BOOT UNIVERSAL DISP (MISCELLANEOUS) ×3 IMPLANT
MANIFOLD NEPTUNE II (INSTRUMENTS) ×3 IMPLANT
NAIL TRIGEN 10MMX36CM-125 RT (Nail) ×3 IMPLANT
NS IRRIG 1000ML POUR BTL (IV SOLUTION) ×3 IMPLANT
PACK GENERAL/GYN (CUSTOM PROCEDURE TRAY) ×3 IMPLANT
PAD ARMBOARD 7.5X6 YLW CONV (MISCELLANEOUS) ×6 IMPLANT
PAD CAST 4YDX4 CTTN HI CHSV (CAST SUPPLIES) ×2 IMPLANT
PADDING CAST COTTON 4X4 STRL (CAST SUPPLIES) ×4
PIN GUIDE 3.2X343MM (PIN) ×1 IMPLANT
SCREW LAG COMPR KIT 95/90 (Screw) ×3 IMPLANT
STAPLER VISISTAT 35W (STAPLE) ×3 IMPLANT
SUT VIC AB 0 CT1 27 (SUTURE) ×4
SUT VIC AB 0 CT1 27XBRD ANBCTR (SUTURE) ×2 IMPLANT
SUT VIC AB 2-0 CT1 27 (SUTURE) ×4
SUT VIC AB 2-0 CT1 TAPERPNT 27 (SUTURE) ×2 IMPLANT
TOWEL OR 17X24 6PK STRL BLUE (TOWEL DISPOSABLE) ×3 IMPLANT
TOWEL OR 17X26 10 PK STRL BLUE (TOWEL DISPOSABLE) ×3 IMPLANT
WATER STERILE IRR 1000ML POUR (IV SOLUTION) ×3 IMPLANT

## 2017-08-19 NOTE — Anesthesia Postprocedure Evaluation (Signed)
Anesthesia Post Note  Patient: Taylor Kramer  Procedure(s) Performed: INTRAMEDULLARY (IM) NAIL INTERTROCHANTRIC (Right Hip)     Patient location during evaluation: PACU Anesthesia Type: General Level of consciousness: awake and alert Pain management: pain level controlled Vital Signs Assessment: post-procedure vital signs reviewed and stable Respiratory status: spontaneous breathing, nonlabored ventilation, respiratory function stable and patient connected to nasal cannula oxygen Cardiovascular status: blood pressure returned to baseline and stable Postop Assessment: no apparent nausea or vomiting Anesthetic complications: no    Last Vitals:  Vitals:   08/19/17 1939 08/19/17 1945  BP: (!) 147/93   Pulse:  77  Resp:  15  Temp:  (!) 36.1 C  SpO2:  94%    Last Pain:  Vitals:   08/19/17 1843  TempSrc:   PainSc: 0-No pain                 Ferron Ishmael,W. EDMOND

## 2017-08-19 NOTE — ED Provider Notes (Signed)
[facility] EMERGENCY DEPARTMENT Provider Note   CSN: [id number] Arrival date & time: 08/19/17  0141     History   Chief Complaint Chief Complaint  Patient presents with  . Fall  . Hip Pain    right    HPI Qiara Minetti is a 84 y.o. female.  HPI  This is an 84 year old female who presents with right hip pain.  Patient reports that she was walking to the bathroom when her right hip "gave out."  She fell onto her right hip.  She denies hitting her head or loss of consciousness.  She denies chest pain or shortness of breath.  She was neurovascular intact for EMS.  She received fentanyl and currently rates her pain at 0 out of 10.  She denies pain elsewhere including her back. Past Medical History:  Diagnosis Date  . CREST syndrome ([place])   . Hypertension   . Insomnia   . Neuropathy   . Osteoporosis     Patient Active Problem List   Diagnosis Date Noted  . Fall 08/19/2017  . Closed right hip fracture ([place]) 08/19/2017  . Hypokalemia 08/19/2017  . Cellulitis of left buttock   . Decubitus ulcer of back 03/19/2017  . Fecal impaction ([place]) 03/19/2017  . UTI (lower urinary tract infection) 11/21/2014  . Near syncope 11/21/2014  . Hypertension 11/21/2014  . History of CREST syndrome 11/21/2014    Past Surgical History:  Procedure Laterality Date  . ABDOMINAL HYSTERECTOMY    . CHOLECYSTECTOMY    . renal stones      OB History    No data available       Home Medications    Prior to Admission medications   Medication Sig Start Date End Date Taking? Authorizing Provider  acetaminophen (TYLENOL) 500 MG tablet Take 500 mg by mouth every 6 (six) hours as needed for moderate pain.    [provider]  alendronate (FOSAMAX) 70 MG tablet Take 70 mg by mouth once a week. Take with a full glass of water on an empty stomach on Fridays.    [provider]  amLODipine (NORVASC) 2.5 MG tablet Take 2.5 mg by mouth daily.    [provider]  cholecalciferol (VITAMIN D) 1000 UNITS tablet Take 1,000 Units by mouth daily.    [provider]  Cranberry 450 MG CAPS Take 1 capsule by mouth 2 (two) times daily.    [provider]  doxycycline (VIBRA-TABS) 100 MG tablet Take 1 tablet (100 mg total) by mouth every 12 (twelve) hours. For 3days 03/24/17   Domenic Polite, MD  fluticasone [facility]) 50 MCG/ACT nasal spray Place 2 sprays into both nostrils daily.    [provider]  loratadine (CLARITIN) 10 MG tablet Take 10 mg by mouth daily as needed for allergies.     [provider]  polyethylene glycol (MIRALAX / GLYCOLAX) packet Take 17 g by mouth daily as needed for mild constipation.    [provider]  Propylene Glycol (SYSTANE BALANCE) 0.6 % SOLN Apply 1 drop to eye 4 (four) times daily.    [provider]  psyllium (KONSYL) 0.52 G capsule Take 0.52 g by mouth daily.    [provider]  senna-docusate (SENOKOT-S) 8.6-50 MG tablet Take 1 tablet by mouth 2 (two) times daily. 03/24/17   Domenic Polite, MD  zolpidem (AMBIEN) 10 MG tablet Take 10 mg by mouth at bedtime.     [provider]  Family History Family History  Problem Relation Age of Onset  . Neuropathy Brother     Social History Social History   Tobacco Use  . Smoking status: Former Research scientist (life sciences)  . Smokeless tobacco: Never Used  Substance Use Topics  . Alcohol use: Yes    Comment: one drink a wek  . Drug use: No     Allergies   Patient has no known allergies.   Review of Systems Review of Systems  Constitutional: Negative for fever.  Respiratory: Negative for shortness of breath.   Cardiovascular: Negative for chest pain.  Musculoskeletal: Negative for back pain.       Right hip pain  All other systems reviewed and are negative.    Physical Exam Updated Vital Signs BP 137/83   Pulse 74   Temp 98.3 F (36.8 C) (Oral)   Resp 16   Ht 5\' 7"  (1.702 m)   Wt 44.9 kg (99  lb)   SpO2 93%   BMI 15.51 kg/m   Physical Exam  Constitutional: She is oriented to person, place, and time.  Elderly, frail, no acute distress  HENT:  Head: Normocephalic and atraumatic.  Cardiovascular: Normal rate, regular rhythm and normal heart sounds.  Pulmonary/Chest: Effort normal. No respiratory distress. She has no wheezes.  Coarse breath sounds, fair air movement  Abdominal: Soft. She exhibits distension. There is no tenderness.  Musculoskeletal:  Right lower extremity slightly externally rotated and foreshortened, range of motion not tested secondary to foreshortening, 2+ DP pulse, neurovascularly intact distally  Neurological: She is alert and oriented to person, place, and time.  Skin: Skin is warm and dry.  Abrasion right knee  Psychiatric: She has a normal mood and affect.  Nursing note and vitals reviewed.    ED Treatments / Results  Labs (all labs ordered are listed, but only abnormal results are displayed) Labs Reviewed  CBC WITH DIFFERENTIAL/PLATELET - Abnormal; Notable for the following components:      Result Value   Hemoglobin 11.8 (*)    RDW 16.5 (*)    Neutro Abs 8.0 (*)    All other components within normal limits  BASIC METABOLIC PANEL - Abnormal; Notable for the following components:   Potassium 3.4 (*)    Glucose, Bld 111 (*)    BUN 21 (*)    Calcium 8.5 (*)    All other components within normal limits  PROTIME-INR  APTT  CBC  BASIC METABOLIC PANEL  TYPE AND SCREEN    EKG  EKG Interpretation None       Radiology Dg Chest 1 View  Result Date: 08/19/2017 CLINICAL DATA:  Cough.  Fall with fracture of hip. EXAM: CHEST 1 VIEW COMPARISON:  03/19/2017 FINDINGS: Patient rotation limits the evaluation. Shallow inspiration. Heart size and pulmonary vascularity are normal. Probable emphysematous and chronic bronchitic changes in the lungs. No focal consolidation or airspace disease. No pleural effusions. No pneumothorax. Gas-filled stomach  may indicate gastroparesis or ileus. IMPRESSION: Shallow inspiration. Emphysematous and chronic bronchitic changes in the lungs. No evidence of active pulmonary disease. Gas distended stomach may indicate ileus or gastroparesis. Electronically Signed   By: Lucienne Capers M.D.   On: 08/19/2017 02:43   Dg Hip Unilat  With Pelvis 2-3 Views Right  Result Date: 08/19/2017 CLINICAL DATA:  Fall with right hip pain EXAM: DG HIP (WITH OR WITHOUT PELVIS) 2-3V RIGHT COMPARISON:  CT abdomen pelvis 03/17/2017 FINDINGS: Comminuted intertrochanteric fracture of the proximal right femur. No femoral head dislocation. Pubic symphysis  is intact. Massive feces in the pelvis. IMPRESSION: Of acute comminuted intertrochanteric fracture of the proximal right femur. Massive feces in the pelvis. Electronically Signed   By: Donavan Foil M.D.   On: 08/19/2017 02:43    Procedures Procedures (including critical care time)  Medications Ordered in ED Medications  acetaminophen (TYLENOL) tablet 500 mg (not administered)  amLODipine (NORVASC) tablet 2.5 mg (not administered)  cholecalciferol (VITAMIN D) tablet 1,000 Units (not administered)  Cranberry CAPS 1 capsule (not administered)  fluticasone (FLONASE) 50 MCG/ACT nasal spray 2 spray (not administered)  loratadine (CLARITIN) tablet 10 mg (not administered)  polyethylene glycol (MIRALAX / GLYCOLAX) packet 17 g (not administered)  Propylene Glycol 0.6 % SOLN 1 drop (not administered)  psyllium (REGULOID) capsule 0.52 g (not administered)  senna-docusate (Senokot-S) tablet 1 tablet (not administered)  zolpidem (AMBIEN) tablet 10 mg (not administered)  oxyCODONE-acetaminophen (PERCOCET/ROXICET) 5-325 MG per tablet 1 tablet (not administered)  morphine 4 MG/ML injection 0.52 mg (not administered)  methocarbamol (ROBAXIN) tablet 500 mg (not administered)  hydrALAZINE (APRESOLINE) injection 5 mg (not administered)  hydrOXYzine (ATARAX/VISTARIL) tablet 10 mg (not  administered)  potassium chloride 20 MEQ/15ML (10%) solution 20 mEq (not administered)     Initial Impression / Assessment and Plan / ED Course  I have reviewed the triage vital signs and the nursing notes.  Pertinent labs & imaging results that were available during my care of the patient were reviewed by me and considered in my medical decision making (see chart for details).     Patient presents with right hip pain following a mechanical fall.  X-rays positive for right hip fracture.  I discussed with Dr. Ninfa Linden.  Recommends her being n.p.o. for possible procedure later today.  This was discussed with Dr. Blaine Hamper.  He will admit the patient.  Final Clinical Impressions(s) / ED Diagnoses   Final diagnoses:  Closed fracture of right hip, initial encounter [facility])    ED Discharge Orders    None       Dina Rich, Barbette Hair, MD 08/19/17 0403

## 2017-08-19 NOTE — Progress Notes (Signed)
Orthopedic Tech Progress Note Patient Details:  Taylor Kramer [birth year] [id number]  Patient ID: Brent Bulla, female   DOB: 04-14-1932, 84 y.o.   MRN: [id number] Pt cant have ohf due to age restrictions  Karolee Stamps 08/19/2017, 5:02 AM

## 2017-08-19 NOTE — Progress Notes (Addendum)
Initial Nutrition Assessment  DOCUMENTATION CODES:   Severe malnutrition in context of chronic illness, Underweight  INTERVENTION:    Recommend swallow evaluation prior to PO diet advancement   RD to add supplements when/as able  NUTRITION DIAGNOSIS:   Severe Malnutrition related to chronic illness(CREST syndrome ) as evidenced by severe muscle depletion, severe fat depletion  GOAL:   Patient will meet greater than or equal to 90% of their needs  MONITOR:   Diet advancement, PO intake, Supplement acceptance, Labs, Skin, Weight trends  REASON FOR ASSESSMENT:   Consult Assessment of nutrition requirement/status  ASSESSMENT:   84 y.o. Female with medical history significant of hypertension, crest syndrome, who presents with fall, right hip pain.  Pt admitted from [facility]F.  Appetite PTA was good. Eats her meals at the dining room at her ALF. Reports the staff helps her to the dining room in a wheelchair.  Pt also reveals she does not like milk. Also doesn't like Ensure supplements. Was agreeable to try Boost Breeze (clear liquid) supplements post-op. Labs and medications reviewed. CBG's 102-111.  RD spoke with [facility] Student RN outside of pt's room. Pt's son had informed her that pt has some coughing spells after eating. Recommend SLP consult for swallow evaluation.  NUTRITION - FOCUSED PHYSICAL EXAM:    Most Recent Value  Orbital Region  Severe depletion  Upper Arm Region  Severe depletion  Thoracic and Lumbar Region  Unable to assess  Buccal Region  Severe depletion  Temple Region  Severe depletion  Clavicle Bone Region  Severe depletion  Clavicle and Acromion Bone Region  Severe depletion  Scapular Bone Region  Unable to assess  Dorsal Hand  Unable to assess  Patellar Region  Severe depletion  Anterior Thigh Region  Severe depletion  Posterior Calf Region  Severe depletion  Edema (RD Assessment)  None     Diet Order:  Diet NPO time specified Except  for: [employer], Sips with Meds Diet NPO time specified Except for: Sips with Meds  EDUCATION NEEDS:   No education needs have been identified at this time  Skin:  Skin Assessment: Skin Integrity Issues: Skin Integrity Issues:: Other (Comment) Other: non-pressure wound to buttocks  Last BM:  PTA  Height:   Ht Readings from Last 1 Encounters:  08/19/17 5\' 7"  (1.702 m)    Weight:   Wt Readings from Last 1 Encounters:  08/19/17 99 lb (44.9 kg)    Ideal Body Weight:  61.3 kg  BMI:  Body mass index is 15.51 kg/m.  Estimated Nutritional Needs:   Kcal:  1500-1700  Protein:  70-85 gm  Fluid:  1.5-1.7 L  Arthur Holms, RD, LDN Pager #: [phone] After-Hours Pager #: [phone]

## 2017-08-19 NOTE — Brief Op Note (Signed)
08/19/2017  6:23 PM  PATIENT:  Taylor Kramer  84 y.o. female  PRE-OPERATIVE DIAGNOSIS:  RIGHT HIP FRACTURE  POST-OPERATIVE DIAGNOSIS:  RIGHT HIP FRACTURE  PROCEDURE:  Procedure(s): INTRAMEDULLARY (IM) NAIL INTERTROCHANTRIC (Right)  SURGEON:  Surgeon(s) and Role:    * Mcarthur Rossetti, MD - Primary  PHYSICIAN ASSISTANT: Benita Stabile, PA-C  ANESTHESIA:   general  EBL:  100 cc  COUNTS:  YES  DICTATION: .Other Dictation: Dictation Number [id number]  PLAN OF CARE: Admit to inpatient   PATIENT DISPOSITION:  PACU - hemodynamically stable.   Delay start of Pharmacological VTE agent (>24hrs) due to surgical blood loss or risk of bleeding: no

## 2017-08-19 NOTE — Anesthesia Preprocedure Evaluation (Addendum)
Anesthesia Evaluation  Patient identified by MRN, date of birth, ID band Patient awake  General Assessment Comment:CREST syndrome  Reviewed: Allergy & Precautions, NPO status , Patient's Chart, lab work & pertinent test results  Airway Mallampati: II  TM Distance: >3 FB Neck ROM: Full    Dental  (+) Poor Dentition, Chipped   Pulmonary neg pulmonary ROS, former smoker,    Pulmonary exam normal breath sounds clear to auscultation       Cardiovascular hypertension, Normal cardiovascular exam Rhythm:Regular Rate:Normal     Neuro/Psych negative neurological ROS  negative psych ROS   GI/Hepatic negative GI ROS, Neg liver ROS,   Endo/Other  negative endocrine ROS  Renal/GU negative Renal ROS  negative genitourinary   Musculoskeletal negative musculoskeletal ROS (+)   Abdominal   Peds negative pediatric ROS (+)  Hematology negative hematology ROS (+)   Anesthesia Other Findings   Reproductive/Obstetrics negative OB ROS                            Anesthesia Physical Anesthesia Plan  ASA: III  Anesthesia Plan: General   Post-op Pain Management:    Induction: Intravenous  PONV Risk Score and Plan: 2 and Ondansetron, Dexamethasone and Treatment may vary due to age or medical condition  Airway Management Planned: Oral ETT  Additional Equipment:   Intra-op Plan:   Post-operative Plan: Extubation in OR  Informed Consent: I have reviewed the patients History and Physical, chart, labs and discussed the procedure including the risks, benefits and alternatives for the proposed anesthesia with the patient or authorized representative who has indicated his/her understanding and acceptance.   Dental advisory given  Plan Discussed with: CRNA and Surgeon  Anesthesia Plan Comments:         Anesthesia Quick Evaluation

## 2017-08-19 NOTE — Anesthesia Procedure Notes (Signed)
Procedure Name: Intubation Date/Time: 08/19/2017 5:32 PM Performed by: Bryson Corona, CRNA Pre-anesthesia Checklist: Patient identified, Emergency Drugs available, Suction available and Patient being monitored Patient Re-evaluated:Patient Re-evaluated prior to induction Oxygen Delivery Method: Circle System Utilized Preoxygenation: Pre-oxygenation with 100% oxygen Induction Type: IV induction Ventilation: Mask ventilation without difficulty Laryngoscope Size: Mac and 3 Grade View: Grade II Tube type: Oral Tube size: 7.0 mm Number of attempts: 1 Airway Equipment and Method: Stylet and Oral airway Placement Confirmation: ETT inserted through vocal cords under direct vision,  positive ETCO2 and breath sounds checked- equal and bilateral Secured at: 22 cm Tube secured with: Tape Dental Injury: Teeth and Oropharynx as per pre-operative assessment  Comments: Grade 2b view

## 2017-08-19 NOTE — H&P (Signed)
History and Physical    Taylor Kramer JME:[id number] DOB: [birth year] DOA: 08/19/2017  Referring MD/NP/PA:   PCP: Leeroy Cha, MD   Patient coming from:  The patient is coming from Assistant living facility.  At baseline, pt is partially dependent for most of ADL.   Chief Complaint: fall and right hip pain  HPI: Taylor Kramer is a 84 y.o. female with medical history significant of hypertension, crest syndrome, who presents with fall, right hip pain.  Pt states that she she tripped and fell when she was getting up to go to the bathroom in the early morning. She injured her right hip and developed severe pain in the right hip. The right hip pain is constant, severe, sharp, nonradiating. No leg weakness or numbness. She denies LOC. She stater that she usually ambulates with a walker. Patient denies chest pain and SOB. She denies cough, but her reporting post nasal drip. No fever or chills. Denies nausea, vomiting, diarrhea, abdominal pain, symptoms of UTI.  ED Course: pt was found to have WBC 9.5, potassium 3.4, creatinine normal, no fever, no tachycardia, ETC. 93% on room air. X-ray showed acute comminuted intertrochanteric fracture of the proximal right femur. Pt is admitted to [place] bed as inpatient. Orthopedic surgeon, Dr. Ninfa Linden, was consulted, planning to do surgery in the late afternoon.  Review of Systems:   General: no fevers, chills, no body weight gain, has fatigue HEENT: no blurry vision, hearing changes or sore throat Respiratory: no dyspnea, coughing, wheezing CV: no chest pain, no palpitations GI: no nausea, vomiting, abdominal pain, diarrhea, constipation GU: no dysuria, burning on urination, increased urinary frequency, hematuria  Ext: no leg edema Neuro: no unilateral weakness, numbness, or tingling, no vision change or hearing loss Skin: no rash, no skin tear. MSK: has right hip pain. Heme: No easy bruising.  Travel history: No recent long distant  travel.  Allergy: No Known Allergies  Past Medical History:  Diagnosis Date  . CREST syndrome ([place])   . Hypertension   . Insomnia   . Neuropathy   . Osteoporosis     Past Surgical History:  Procedure Laterality Date  . ABDOMINAL HYSTERECTOMY    . CHOLECYSTECTOMY    . renal stones      Social History:  reports that she has quit smoking. she has never used smokeless tobacco. She reports that she drinks alcohol. She reports that she does not use drugs.  Family History:  Family History  Problem Relation Age of Onset  . Neuropathy Brother      Prior to Admission medications   Medication Sig Start Date End Date Taking? Authorizing Provider  acetaminophen (TYLENOL) 500 MG tablet Take 500 mg by mouth every 6 (six) hours as needed for moderate pain.    [provider]  alendronate (FOSAMAX) 70 MG tablet Take 70 mg by mouth once a week. Take with a full glass of water on an empty stomach on Fridays.    [provider]  amLODipine (NORVASC) 2.5 MG tablet Take 2.5 mg by mouth daily.    [provider]  cholecalciferol (VITAMIN D) 1000 UNITS tablet Take 1,000 Units by mouth daily.    [provider]  Cranberry 450 MG CAPS Take 1 capsule by mouth 2 (two) times daily.    [provider]  doxycycline (VIBRA-TABS) 100 MG tablet Take 1 tablet (100 mg total) by mouth every 12 (twelve) hours. For 3days 03/24/17   Taylor Polite, MD  fluticasone [facility]) 50 MCG/ACT nasal spray  Place 2 sprays into both nostrils daily.    [provider]  loratadine (CLARITIN) 10 MG tablet Take 10 mg by mouth daily as needed for allergies.     [provider]  polyethylene glycol (MIRALAX / GLYCOLAX) packet Take 17 g by mouth daily as needed for mild constipation.    [provider]  Propylene Glycol (SYSTANE BALANCE) 0.6 % SOLN Apply 1 drop to eye 4 (four) times daily.    [provider]  psyllium (KONSYL) 0.52 G capsule Take 0.52 g  by mouth daily.    [provider]  senna-docusate (SENOKOT-S) 8.6-50 MG tablet Take 1 tablet by mouth 2 (two) times daily. 03/24/17   Taylor Polite, MD  zolpidem (AMBIEN) 10 MG tablet Take 10 mg by mouth at bedtime.     [provider]    Physical Exam: Vitals:   08/19/17 0200 08/19/17 0206 08/19/17 0230 08/19/17 0300  BP: (!) 176/92  (!) 157/91 137/83  Pulse: 80  86 74  Resp: 15  13 16   Temp:      TempSrc:      SpO2: 94%  99% 93%  Weight:  44.9 kg (99 lb)    Height:  5\' 7"  (1.702 m)     General: Not in acute distress HEENT:       Eyes: PERRL, EOMI, no scleral icterus.       ENT: No discharge from the ears and nose, no pharynx injection, no tonsillar enlargement.        Neck: No JVD, no bruit, no mass felt. Heme: No neck lymph node enlargement. Cardiac: S1/S2, RRR, No murmurs, No gallops or rubs. Respiratory: No rales, wheezing, rhonchi or rubs. GI: Soft, nondistended, nontender, no rebound pain, no organomegaly, BS present. GU: No hematuria Ext: No pitting leg edema bilaterally. 2+DP/PT pulse bilaterally. Musculoskeletal: has right hip tenderness Skin: No rashes.  Neuro: Alert, oriented X3, cranial nerves II-XII grossly intact, moves all extremities. Psych: Patient is not psychotic, no suicidal or hemocidal ideation.  Labs on Admission: I have personally reviewed following labs and imaging studies  CBC: Recent Labs  Lab 08/19/17 0239  WBC 9.5  NEUTROABS 8.0*  HGB 11.8*  HCT 37.5  MCV 87.6  PLT 967   Basic Metabolic Panel: Recent Labs  Lab 08/19/17 0239  NA 140  K 3.4*  CL 104  CO2 26  GLUCOSE 111*  BUN 21*  CREATININE 0.84  CALCIUM 8.5*   GFR: Estimated Creatinine Clearance: 36 mL/min (by C-G formula based on SCr of 0.84 mg/dL). Liver Function Tests: No results for input(s): AST, ALT, ALKPHOS, BILITOT, PROT, ALBUMIN in the last 168 hours. No results for input(s): LIPASE, AMYLASE in the last 168 hours. No results for input(s):  AMMONIA in the last 168 hours. Coagulation Profile: No results for input(s): INR, PROTIME in the last 168 hours. Cardiac Enzymes: No results for input(s): CKTOTAL, CKMB, CKMBINDEX, TROPONINI in the last 168 hours. BNP (last 3 results) No results for input(s): PROBNP in the last 8760 hours. HbA1C: No results for input(s): HGBA1C in the last 72 hours. CBG: No results for input(s): GLUCAP in the last 168 hours. Lipid Profile: No results for input(s): CHOL, HDL, LDLCALC, TRIG, CHOLHDL, LDLDIRECT in the last 72 hours. Thyroid Function Tests: No results for input(s): TSH, T4TOTAL, FREET4, T3FREE, THYROIDAB in the last 72 hours. Anemia Panel: No results for input(s): VITAMINB12, FOLATE, FERRITIN, TIBC, IRON, RETICCTPCT in the last 72 hours. Urine analysis:    Component Value Date/Time  COLORURINE YELLOW 03/19/2017 0424   APPEARANCEUR CLEAR 03/19/2017 0424   LABSPEC 1.013 03/19/2017 0424   PHURINE 6.0 03/19/2017 0424   GLUCOSEU NEGATIVE 03/19/2017 0424   HGBUR MODERATE (A) 03/19/2017 0424   BILIRUBINUR NEGATIVE 03/19/2017 0424   KETONESUR NEGATIVE 03/19/2017 0424   PROTEINUR NEGATIVE 03/19/2017 0424   UROBILINOGEN 1.0 11/21/2014 2046   NITRITE NEGATIVE 03/19/2017 0424   LEUKOCYTESUR SMALL (A) 03/19/2017 0424   Sepsis Labs: @LABRCNTIP (procalcitonin:4,lacticidven:4) )No results found for this or any previous visit (from the past 240 hour(s)).   Radiological Exams on Admission: Dg Chest 1 View  Result Date: 08/19/2017 CLINICAL DATA:  Cough.  Fall with fracture of hip. EXAM: CHEST 1 VIEW COMPARISON:  03/19/2017 FINDINGS: Patient rotation limits the evaluation. Shallow inspiration. Heart size and pulmonary vascularity are normal. Probable emphysematous and chronic bronchitic changes in the lungs. No focal consolidation or airspace disease. No pleural effusions. No pneumothorax. Gas-filled stomach may indicate gastroparesis or ileus. IMPRESSION: Shallow inspiration. Emphysematous and  chronic bronchitic changes in the lungs. No evidence of active pulmonary disease. Gas distended stomach may indicate ileus or gastroparesis. Electronically Signed   By: Lucienne Capers M.D.   On: 08/19/2017 02:43   Dg Hip Unilat  With Pelvis 2-3 Views Right  Result Date: 08/19/2017 CLINICAL DATA:  Fall with right hip pain EXAM: DG HIP (WITH OR WITHOUT PELVIS) 2-3V RIGHT COMPARISON:  CT abdomen pelvis 03/17/2017 FINDINGS: Comminuted intertrochanteric fracture of the proximal right femur. No femoral head dislocation. Pubic symphysis is intact. Massive feces in the pelvis. IMPRESSION: Of acute comminuted intertrochanteric fracture of the proximal right femur. Massive feces in the pelvis. Electronically Signed   By: Donavan Foil M.D.   On: 08/19/2017 02:43       EKG: Independently reviewed.  Sinus rhythm, QTC 501, bifascicular block   Assessment/Plan Principal Problem:   Closed right hip fracture [facility]) Active Problems:   Hypertension   Fall   Hypokalemia   Closed right hip fracture ([place]):  As evidenced by x-ray. Patient has moderate pain now. No neurovascular compromise. Orthopedic surgeon was consulted. Dr. Ninfa Linden is planning to do surgery in later afternoon. - will admit to Med-surg bed - Pain control: morphine prn and percocet - When necessary hydroxyzine for nausea - Robaxin for muscle spasm - type and cross - INR/PTT - PT/OT when able to (not ordered now)  HTN:  -Continue home medications: Amlodipine -IV hydralazine prn  Hypokalemia: K= 3.4 on admission. - Repleted  Fall: seems to have a mechanical fall. No LOC. Patient strongly denies any injury to head and the neck - PT/OT when able to (not ordered now) -Patient refused CT head and the neck  DVT ppx: SCD Code Status: Full code Family Communication: None at bed side.  Disposition Plan: to be determioned Consults called:  Ortho, Dr. Rush Farmer Admission status:  medical floor/inpt  Date of Service 08/19/2017     Ivor Costa [facility] Pager [phone]  If 7PM-7AM, please contact night-coverage [email or web address] Password [email or web address] 08/19/2017, 4:00 AM

## 2017-08-19 NOTE — ED Notes (Signed)
Patient transported to X-ray 

## 2017-08-19 NOTE — Op Note (Signed)
NAMESERITA, DEGROOTE NO.:  [id number]  MEDICAL RECORD NO.:  [id number]  LOCATION:  5N15C                        FACILITY:  [facility]  PHYSICIAN:  Lind Guest. Ninfa Linden, M.D.DATE OF BIRTH:  1931-09-23  DATE OF PROCEDURE:  08/19/2017 DATE OF DISCHARGE:                              OPERATIVE REPORT   PREOPERATIVE DIAGNOSIS:  Complex comminuted right intertrochanteric femur/proximal hip fracture.  POSTOPERATIVE DIAGNOSIS:  Complex comminuted right intertrochanteric femur/proximal hip fracture.  PROCEDURE:  Open reduction and internal fixation of right intertrochanteric hip fracture using [employer] and Nephew Intertan nail.  IMPLANTS:  10 mm x 36 cm right Smith and Nephew Intertan femoral nail with a 95-mm lag screw and 90-mm compression screw, interlocking construct.  SURGEON:  Lind Guest. Ninfa Linden, MD.  ASSISTANT:  Erskine Emery, PA-C.  ANESTHESIA:  General.  ANTIBIOTICS:  2 g of IV Ancef.  BLOOD LOSS:  Less than 100 mL.  COMPLICATIONS:  None.  INDICATIONS:  Ms. Coakley is an 84 year old female with multiple medical problems who does stay in an assisted living facility.  She is a minimal ambulator, but does get up with a walker to go to the bathroom and is mainly bed and wheelchair bound.  She sustained a mechanical fall earlier this morning and was brought to the [facility] Emergency Room where she was found to have a complex intertrochanteric hip fracture. Due to the severity of pain and the detrimental impact this has on her quality of life, her and her family did wish to proceed with surgery. We had a long and thorough discussion about the risks and benefits of the surgery and they did wish to proceed.  PROCEDURE DESCRIPTION:  After informed consent was obtained, appropriate right hip was marked.  She was brought to the operating room.  General anesthesia was obtained while she was on her stretcher.  She was then placed supine on the fracture  table with her right leg in her fracture boot and in-line skeletal traction and her left hip abducted and placed in a well leg holder, flexed and abducted out of the field.  Perineal post was placed as well.  We then had the bed raised under direct fluoroscopy.  We were able to reduce the fracture.  We then had the hip prepped and draped with DuraPrep and sterile drapes.  Time-out was called and she was identified as correct patient and correct right hip. We then made an incision just proximal to the greater trochanter and dissected down the tip of greater trochanter.  We then placed temporary guide pin under direct visualization and fluoroscopy into the proximal femur.  We used initiating reamer, then opened up the femoral canal.  We then placed our real 10 mm x 36 cm right Intertan femoral nail from [place] and Nephew down the intramedullary canal removing the guide pin. We did not need to ream for this either.  Of note, we did choose our rod size keeping it sterile in the box under direct fluoroscopy prior to prepping and draping.  Using the outrigger guide, we then made a separate lateral incision and drilled under direct fluoroscopy and made our measurements for an integrated interlocking lag screw system for a  95-mm lag screw and a 90-mm compression screw.  We placed the lag screw and compression screw and then was able to compress the fracture, watching this visually under direct fluoroscopy.  We then removed the outrigger guide and all instrumentation.  We were able to put the hip through internal and external rotation.  It moved as a unit.  We were able to then wash 2 small wounds with normal saline solution and closed the deep tissue with 0 Vicryl followed by 2-0 Vicryl in the subcutaneous tissue, interrupted staples on the skin.  Well-padded sterile dressing was applied.  She was taken off the fracture table, awakened, extubated, and taken to the recovery room in stable condition.   All final counts were correct.  There were no complications noted.  Of note, Erskine Emery, PA-C, assisted in entirety case.  His assistance was crucial for facilitating all aspects of this case.     Lind Guest. Ninfa Linden, M.D.     CYB/MEDQ  D:  08/19/2017  T:  08/19/2017  Job:  [id number]

## 2017-08-19 NOTE — ED Triage Notes (Signed)
Per EMS, pt from [place]. Pt reports she was getting up to go to the bathroom when she felt her rt hip give out and she had an fall onto her bottom. Pt was reporting rt hip pain. Denies head, back or neck pain.  Denies LOC. A&Ox3. 181mcg Fentanyl given by EMS. Strong bilateral pedal pulses with baseline swelling in both feet. Pt not on blood thinners. Pt usually ambulates with a walker.  EMS VS BP 181/106. HR 88 regular, SpO2 95% room air. R 16. Pt received 1000mg  Tylenol before ems arrival. Hx HTN.  In triage, pt has productive congested cough.

## 2017-08-19 NOTE — Progress Notes (Signed)
PHARMACIST - PHYSICIAN ORDER COMMUNICATION  CONCERNING: P&T Medication Policy on Herbal Medications  DESCRIPTION:  This patient's order for:  Cranberry  has been noted.  This product(s) is classified as an "herbal" or natural product. Due to a lack of definitive safety studies or FDA approval, nonstandard manufacturing practices, plus the potential risk of unknown drug-drug interactions while on inpatient medications, the Pharmacy and Therapeutics Committee does not permit the use of "herbal" or natural products of this type within [facility] [place].   ACTION TAKEN: The pharmacy department is unable to verify this order at this time and your patient has been informed of this safety policy. Please reevaluate patient's clinical condition at discharge and address if the herbal or natural product(s) should be resumed at that time.   

## 2017-08-19 NOTE — Progress Notes (Signed)
PROGRESS NOTE  Taylor Kramer JEH:[id number] DOB: [birth year] DOA: 08/19/2017 PCP: Leeroy Cha, MD  HPI/Recap of past 42 hours: 84 year old female with past medical history of hypertension and crest syndrome who resides in assisted living facility brought into the emergency room on the early morning of 1/22 after she sustained a fall landing on her right side. Is unable to stand. Patient normally ambulates with a wheelchair although for extremely short distances such as from her bed to bathroom, she uses a walker.  In the emergency room, patient found to have an acute comminuted intertrochanteric fracture of the proximal right femur. Seen by orthopedic surgery who plan to take patient to the OR later today. Patient admitted to the hospital service for further medical management.  Patient seen this morning, prior to surgery. She is doing well actually with minimal complaints. Some pain in that right hip, but tolerated  Assessment/Plan: Principal Problem:   Closed right hip fracture [facility]): Seen by orthopedic surgery. Plans for ORIF later today Active Problems:   Hypertension: Blood pressure stable   Fall   Hypokalemia: Replace as needed   Protein-calorie malnutrition, severe/underweight: Patient meets criteria in the context of chronic illness. Her malnutrition is related to her crest syndrome. Following surgery, nutrition recommending swallow evaluation. Then can add supplement as needed.   Code Status: DO NOT RESUSCITATE   Family Communication: Sons at the bedside   Disposition Plan: Phonosurgery, monitor for 1-2 days to skilled nursing prior to return to assisted living    Consultants:  Orthopedic surgery   Procedures:  Planned orthopedic repair   Antimicrobials:  Preop Ancef   DVT prophylaxis:  SCDs   Objective: Vitals:   08/19/17 0430 08/19/17 0500 08/19/17 0900 08/19/17 1300  BP: (!) 145/75 (!) 150/73 (!) 162/96 (!) 142/66  Pulse: 69 82 91 89  Resp: 18  18 20 18   Temp:  98.7 F (37.1 C) (!) 97.3 F (36.3 C) (!) 97.3 F (36.3 C)  TempSrc:  Oral Oral Oral  SpO2: 96% 96% 97% 95%  Weight:      Height:        Intake/Output Summary (Last 24 hours) at 08/19/2017 1519 Last data filed at 08/19/2017 0900 Gross per 24 hour  Intake -  Output 400 ml  Net -400 ml   Filed Weights   08/19/17 0206  Weight: 44.9 kg (99 lb)    Exam:   General:  Alert and oriented 2, no acute distress, underweight  HEENT: Normocephalic/atraumatic, hemorrhage or dry, poor dentition  Neck: Supple, no JVD   Cardiovascular: Regular rate and rhythm, S1-S2   Respiratory: Clear to auscultation bilaterally   Abdomen: Soft, nontender, nondistended, positive bowel sounds   Musculoskeletal: No clubbing or cyanosis or edema   Psychiatry: Appropriate, no evidence of psychoses    Data Reviewed: CBC: Recent Labs  Lab 08/19/17 0239 08/19/17 0408  WBC 9.5 11.1*  NEUTROABS 8.0*  --   HGB 11.8* 11.5*  HCT 37.5 37.1  MCV 87.6 87.7  PLT 256 378   Basic Metabolic Panel: Recent Labs  Lab 08/19/17 0239 08/19/17 0408  NA 140 141  K 3.4* 3.5  CL 104 104  CO2 26 26  GLUCOSE 111* 102*  BUN 21* 20  CREATININE 0.84 0.77  CALCIUM 8.5* 8.5*   GFR: Estimated Creatinine Clearance: 37.8 mL/min (by C-G formula based on SCr of 0.77 mg/dL). Liver Function Tests: No results for input(s): AST, ALT, ALKPHOS, BILITOT, PROT, ALBUMIN in the last 168 hours. No results for input(s):  LIPASE, AMYLASE in the last 168 hours. No results for input(s): AMMONIA in the last 168 hours. Coagulation Profile: Recent Labs  Lab 08/19/17 0408  INR 0.98   Cardiac Enzymes: No results for input(s): CKTOTAL, CKMB, CKMBINDEX, TROPONINI in the last 168 hours. BNP (last 3 results) No results for input(s): PROBNP in the last 8760 hours. HbA1C: No results for input(s): HGBA1C in the last 72 hours. CBG: No results for input(s): GLUCAP in the last 168 hours. Lipid Profile: No results  for input(s): CHOL, HDL, LDLCALC, TRIG, CHOLHDL, LDLDIRECT in the last 72 hours. Thyroid Function Tests: No results for input(s): TSH, T4TOTAL, FREET4, T3FREE, THYROIDAB in the last 72 hours. Anemia Panel: No results for input(s): VITAMINB12, FOLATE, FERRITIN, TIBC, IRON, RETICCTPCT in the last 72 hours. Urine analysis:    Component Value Date/Time   COLORURINE YELLOW 03/19/2017 0424   APPEARANCEUR CLEAR 03/19/2017 0424   LABSPEC 1.013 03/19/2017 0424   PHURINE 6.0 03/19/2017 0424   GLUCOSEU NEGATIVE 03/19/2017 0424   HGBUR MODERATE (A) 03/19/2017 0424   BILIRUBINUR NEGATIVE 03/19/2017 0424   KETONESUR NEGATIVE 03/19/2017 0424   PROTEINUR NEGATIVE 03/19/2017 0424   UROBILINOGEN 1.0 11/21/2014 2046   NITRITE NEGATIVE 03/19/2017 0424   LEUKOCYTESUR SMALL (A) 03/19/2017 0424   Sepsis Labs: @LABRCNTIP (procalcitonin:4,lacticidven:4)  )No results found for this or any previous visit (from the past 240 hour(s)).    Studies: Dg Chest 1 View  Result Date: 08/19/2017 CLINICAL DATA:  Cough.  Fall with fracture of hip. EXAM: CHEST 1 VIEW COMPARISON:  03/19/2017 FINDINGS: Patient rotation limits the evaluation. Shallow inspiration. Heart size and pulmonary vascularity are normal. Probable emphysematous and chronic bronchitic changes in the lungs. No focal consolidation or airspace disease. No pleural effusions. No pneumothorax. Gas-filled stomach may indicate gastroparesis or ileus. IMPRESSION: Shallow inspiration. Emphysematous and chronic bronchitic changes in the lungs. No evidence of active pulmonary disease. Gas distended stomach may indicate ileus or gastroparesis. Electronically Signed   By: Lucienne Capers M.D.   On: 08/19/2017 02:43   Dg Hip Unilat  With Pelvis 2-3 Views Right  Result Date: 08/19/2017 CLINICAL DATA:  Fall with right hip pain EXAM: DG HIP (WITH OR WITHOUT PELVIS) 2-3V RIGHT COMPARISON:  CT abdomen pelvis 03/17/2017 FINDINGS: Comminuted intertrochanteric fracture of the  proximal right femur. No femoral head dislocation. Pubic symphysis is intact. Massive feces in the pelvis. IMPRESSION: Of acute comminuted intertrochanteric fracture of the proximal right femur. Massive feces in the pelvis. Electronically Signed   By: Donavan Foil M.D.   On: 08/19/2017 02:43    Scheduled Meds: . amLODipine  2.5 mg Oral Daily  . chlorhexidine  60 mL Topical Once  . cholecalciferol  1,000 Units Oral Daily  . fluticasone  2 spray Each Nare Daily  . polyvinyl alcohol  1 drop Both Eyes TID AC & HS  . povidone-iodine  2 application Topical Once  . psyllium  1 packet Oral Daily  . senna-docusate  1 tablet Oral BID  . zolpidem  5 mg Oral QHS    Continuous Infusions: .  ceFAZolin (ANCEF) IV       LOS: 0 days     Annita Brod, MD [facility]  To reach me or the doctor on call, go to: [email or web address] Password [email or web address]  08/19/2017, 3:19 PM

## 2017-08-19 NOTE — Transfer of Care (Addendum)
Immediate Anesthesia Transfer of Care Note  Patient: Taylor Kramer  Procedure(s) Performed: INTRAMEDULLARY (IM) NAIL INTERTROCHANTRIC (Right Hip)  Patient Location: PACU  Anesthesia Type:General  Level of Consciousness: awake, alert  and oriented  Airway & Oxygen Therapy: Patient Spontanous Breathing and Patient connected to nasal cannula oxygen  Post-op Assessment: Report given to RN and Post -op Vital signs reviewed and stable  Post vital signs: Reviewed and stable  Last Vitals:  Vitals:   08/19/17 0900 08/19/17 1300  BP: (!) 162/96 (!) 142/66  Pulse: 91 89  Resp: 20 18  Temp: (!) 36.3 C (!) 36.3 C  SpO2: 97% 95%    Last Pain:  Vitals:   08/19/17 1300  TempSrc: Oral  PainSc:       Patients Stated Pain Goal: 4 (23/53/61 4431)  Complications: No apparent anesthesia complications

## 2017-08-19 NOTE — Consult Note (Signed)
Reason for Consult: Right hip fracture   Referring Physician: Thayer Jew, ME/EDP  Taylor Kramer is an 84 y.o. female.  HPI:  The patient is a pleasant 84 year old female who stays in assisted living facility.  She endplates mainly with a walker.  She sustained an accidental mechanical fall late last night/early this morning and was transported to the [facility] emergency room.  She complained of right hip pain and inability to ambulate.  X-rays were obtained which found a right hip intertrochanteric fracture.  She was admitted to the medicine service for medical optimization and management and orthopedic surgery was consulted for further evaluation and treatment of her right hip fracture.  At the bedside he does report right hip pain that is significant.  Past Medical History:  Diagnosis Date  . CREST syndrome ([place])   . Hypertension   . Insomnia   . Neuropathy   . Osteoporosis     Past Surgical History:  Procedure Laterality Date  . ABDOMINAL HYSTERECTOMY    . CHOLECYSTECTOMY    . renal stones      Family History  Problem Relation Age of Onset  . Neuropathy Brother     Social History:  reports that she has quit smoking. she has never used smokeless tobacco. She reports that she drinks alcohol. She reports that she does not use drugs.  Allergies: No Known Allergies  Medications: I have reviewed the patient's current medications.  Results for orders placed or performed during the hospital encounter of 08/19/17 (from the past 48 hour(s))  CBC with Differential     Status: Abnormal   Collection Time: 08/19/17  2:39 AM  Result Value Ref Range   WBC 9.5 4.0 - 10.5 K/uL   RBC 4.28 3.87 - 5.11 MIL/uL   Hemoglobin 11.8 (L) 12.0 - 15.0 g/dL   HCT 37.5 36.0 - 46.0 %   MCV 87.6 78.0 - 100.0 fL   MCH 27.6 26.0 - 34.0 pg   MCHC 31.5 30.0 - 36.0 g/dL   RDW 16.5 (H) 11.5 - 15.5 %   Platelets 256 150 - 400 K/uL   Neutrophils Relative % 84 %   Neutro Abs 8.0 (H) 1.7 - 7.7 K/uL   Lymphocytes Relative 9 %   Lymphs Abs 0.9 0.7 - 4.0 K/uL   Monocytes Relative 5 %   Monocytes Absolute 0.5 0.1 - 1.0 K/uL   Eosinophils Relative 2 %   Eosinophils Absolute 0.2 0.0 - 0.7 K/uL   Basophils Relative 0 %   Basophils Absolute 0.0 0.0 - 0.1 K/uL  Basic metabolic panel     Status: Abnormal   Collection Time: 08/19/17  2:39 AM  Result Value Ref Range   Sodium 140 135 - 145 mmol/L   Potassium 3.4 (L) 3.5 - 5.1 mmol/L   Chloride 104 101 - 111 mmol/L   CO2 26 22 - 32 mmol/L   Glucose, Bld 111 (H) 65 - 99 mg/dL   BUN 21 (H) 6 - 20 mg/dL   Creatinine, Ser 0.84 0.44 - 1.00 mg/dL   Calcium 8.5 (L) 8.9 - 10.3 mg/dL   GFR calc non Af Amer >60 >60 mL/min   GFR calc Af Amer >60 >60 mL/min    Comment: (NOTE) The eGFR has been calculated using the CKD EPI equation. This calculation has not been validated in all clinical situations. eGFR's persistently <60 mL/min signify possible Chronic Kidney Disease.    Anion gap 10 5 - 15  Type and screen [facility]     Status: None   Collection Time: 08/19/17  4:05 AM  Result Value Ref Range   ABO/RH(D) A POS    Antibody Screen NEG    Sample Expiration 08/22/2017   ABO/Rh     Status: None (Preliminary result)   Collection Time: 08/19/17  4:05 AM  Result Value Ref Range   ABO/RH(D) A POS   Protime-INR     Status: None   Collection Time: 08/19/17  4:08 AM  Result Value Ref Range   Prothrombin Time 12.9 11.4 - 15.2 seconds   INR 0.98   APTT     Status: None   Collection Time: 08/19/17  4:08 AM  Result Value Ref Range   aPTT 28 24 - 36 seconds  CBC     Status: Abnormal   Collection Time: 08/19/17  4:08 AM  Result Value Ref Range   WBC 11.1 (H) 4.0 - 10.5 K/uL   RBC 4.23 3.87 - 5.11 MIL/uL   Hemoglobin 11.5 (L) 12.0 - 15.0 g/dL   HCT 37.1 36.0 - 46.0 %   MCV 87.7 78.0 - 100.0 fL   MCH 27.2 26.0 - 34.0 pg   MCHC 31.0 30.0 - 36.0 g/dL   RDW 16.4 (H) 11.5 - 15.5 %   Platelets 276 150 - 400 K/uL  Basic metabolic  panel     Status: Abnormal   Collection Time: 08/19/17  4:08 AM  Result Value Ref Range   Sodium 141 135 - 145 mmol/L   Potassium 3.5 3.5 - 5.1 mmol/L   Chloride 104 101 - 111 mmol/L   CO2 26 22 - 32 mmol/L   Glucose, Bld 102 (H) 65 - 99 mg/dL   BUN 20 6 - 20 mg/dL   Creatinine, Ser 0.77 0.44 - 1.00 mg/dL   Calcium 8.5 (L) 8.9 - 10.3 mg/dL   GFR calc non Af Amer >60 >60 mL/min   GFR calc Af Amer >60 >60 mL/min    Comment: (NOTE) The eGFR has been calculated using the CKD EPI equation. This calculation has not been validated in all clinical situations. eGFR's persistently <60 mL/min signify possible Chronic Kidney Disease.    Anion gap 11 5 - 15    Dg Chest 1 View  Result Date: 08/19/2017 CLINICAL DATA:  Cough.  Fall with fracture of hip. EXAM: CHEST 1 VIEW COMPARISON:  03/19/2017 FINDINGS: Patient rotation limits the evaluation. Shallow inspiration. Heart size and pulmonary vascularity are normal. Probable emphysematous and chronic bronchitic changes in the lungs. No focal consolidation or airspace disease. No pleural effusions. No pneumothorax. Gas-filled stomach may indicate gastroparesis or ileus. IMPRESSION: Shallow inspiration. Emphysematous and chronic bronchitic changes in the lungs. No evidence of active pulmonary disease. Gas distended stomach may indicate ileus or gastroparesis. Electronically Signed   By: Lucienne Capers M.D.   On: 08/19/2017 02:43   Dg Hip Unilat  With Pelvis 2-3 Views Right  Result Date: 08/19/2017 CLINICAL DATA:  Fall with right hip pain EXAM: DG HIP (WITH OR WITHOUT PELVIS) 2-3V RIGHT COMPARISON:  CT abdomen pelvis 03/17/2017 FINDINGS: Comminuted intertrochanteric fracture of the proximal right femur. No femoral head dislocation. Pubic symphysis is intact. Massive feces in the pelvis. IMPRESSION: Of acute comminuted intertrochanteric fracture of the proximal right femur. Massive feces in the pelvis. Electronically Signed   By: Donavan Foil M.D.   On:  08/19/2017 02:43    ROS Blood pressure (!) 150/73, pulse 82, temperature 98.7 F (37.1 C), temperature source Oral, resp.  rate 18, height 5' 7"  (1.702 m), weight 99 lb (44.9 kg), SpO2 96 %. Physical Exam  Constitutional: She is oriented to person, place, and time. She appears cachectic.  HENT:  Head: Normocephalic and atraumatic.  Eyes: Pupils are equal, round, and reactive to light.  Neck: Normal range of motion. Neck supple.  Cardiovascular: Normal rate.  Respiratory: Effort normal.  GI: Soft.  Musculoskeletal:       Right hip: She exhibits decreased range of motion, decreased strength, tenderness, bony tenderness and deformity.  Neurological: She is alert and oriented to person, place, and time.  Skin: Skin is warm.  Psychiatric: She has a normal mood and affect.    Assessment/Plan: Right hip fracture (intertrochanteric)   To the OR this afternoon for open reduction-internal fixation of the right hip fracture.  This is been discussed with the patient will be discussed with her healthcare power of attorney/family/sons.  She will be n.p.o. for now until surgery late today.   Mcarthur Rossetti 08/19/2017, 7:10 AM

## 2017-08-20 ENCOUNTER — Encounter (HOSPITAL_COMMUNITY): Payer: Self-pay | Admitting: Orthopaedic Surgery

## 2017-08-20 ENCOUNTER — Inpatient Hospital Stay (HOSPITAL_COMMUNITY): Payer: Medicare Other

## 2017-08-20 ENCOUNTER — Other Ambulatory Visit: Payer: Self-pay

## 2017-08-20 DIAGNOSIS — I5032 Chronic diastolic (congestive) heart failure: Secondary | ICD-10-CM | POA: Diagnosis present

## 2017-08-20 DIAGNOSIS — I10 Essential (primary) hypertension: Secondary | ICD-10-CM

## 2017-08-20 LAB — CBC
HEMATOCRIT: 29.6 % — AB (ref 36.0–46.0)
HEMOGLOBIN: 9.5 g/dL — AB (ref 12.0–15.0)
MCH: 28.1 pg (ref 26.0–34.0)
MCHC: 32.1 g/dL (ref 30.0–36.0)
MCV: 87.6 fL (ref 78.0–100.0)
Platelets: 298 10*3/uL (ref 150–400)
RBC: 3.38 MIL/uL — ABNORMAL LOW (ref 3.87–5.11)
RDW: 17.1 % — AB (ref 11.5–15.5)
WBC: 10.5 10*3/uL (ref 4.0–10.5)

## 2017-08-20 LAB — BASIC METABOLIC PANEL
Anion gap: 13 (ref 5–15)
BUN: 21 mg/dL — ABNORMAL HIGH (ref 6–20)
CALCIUM: 8 mg/dL — AB (ref 8.9–10.3)
CHLORIDE: 102 mmol/L (ref 101–111)
CO2: 25 mmol/L (ref 22–32)
CREATININE: 1.1 mg/dL — AB (ref 0.44–1.00)
GFR calc non Af Amer: 45 mL/min — ABNORMAL LOW (ref >60)
GFR, EST AFRICAN AMERICAN: 52 mL/min — AB (ref >60)
GLUCOSE: 134 mg/dL — AB (ref 65–99)
Potassium: 4.4 mmol/L (ref 3.5–5.1)
Sodium: 140 mmol/L (ref 135–145)

## 2017-08-20 LAB — BRAIN NATRIURETIC PEPTIDE: B Natriuretic Peptide: 84 pg/mL (ref 0.0–100.0)

## 2017-08-20 MED ORDER — CEFAZOLIN SODIUM-DEXTROSE 2-4 GM/100ML-% IV SOLN
2.0000 g | Freq: Four times a day (QID) | INTRAVENOUS | Status: AC
  Administered 2017-08-20 (×2): 2 g via INTRAVENOUS
  Filled 2017-08-20 (×2): qty 100

## 2017-08-20 MED ORDER — HYDROCODONE-ACETAMINOPHEN 5-325 MG PO TABS
1.0000 | ORAL_TABLET | Freq: Four times a day (QID) | ORAL | Status: DC | PRN
  Administered 2017-08-20 – 2017-08-22 (×3): 2 via ORAL
  Administered 2017-08-22 – 2017-08-23 (×2): 1 via ORAL
  Filled 2017-08-20: qty 1
  Filled 2017-08-20 (×3): qty 2
  Filled 2017-08-20: qty 1
  Filled 2017-08-20: qty 2

## 2017-08-20 MED ORDER — ASPIRIN EC 325 MG PO TBEC
325.0000 mg | DELAYED_RELEASE_TABLET | Freq: Every day | ORAL | Status: DC
  Administered 2017-08-20 – 2017-08-23 (×4): 325 mg via ORAL
  Filled 2017-08-20 (×4): qty 1

## 2017-08-20 MED ORDER — MENTHOL 3 MG MT LOZG: 1.0000 | LOZENGE | OROMUCOSAL | Status: DC | PRN

## 2017-08-20 MED ORDER — RESOURCE THICKENUP CLEAR PO POWD
ORAL | Status: DC | PRN
  Filled 2017-08-20 (×2): qty 125

## 2017-08-20 MED ORDER — ONDANSETRON HCL 4 MG PO TABS: 4.0000 mg | ORAL_TABLET | Freq: Four times a day (QID) | ORAL | Status: DC | PRN

## 2017-08-20 MED ORDER — METOCLOPRAMIDE HCL 5 MG PO TABS: 5.0000 mg | ORAL_TABLET | Freq: Three times a day (TID) | ORAL | Status: DC | PRN

## 2017-08-20 MED ORDER — ACETAMINOPHEN 325 MG PO TABS
650.0000 mg | ORAL_TABLET | Freq: Four times a day (QID) | ORAL | Status: DC | PRN
  Administered 2017-08-22: 650 mg via ORAL
  Filled 2017-08-20 (×2): qty 2

## 2017-08-20 MED ORDER — MORPHINE SULFATE (PF) 2 MG/ML IV SOLN: 0.5000 mg | INTRAVENOUS | Status: DC | PRN

## 2017-08-20 MED ORDER — METOCLOPRAMIDE HCL 5 MG/ML IJ SOLN: 5.0000 mg | Freq: Three times a day (TID) | INTRAMUSCULAR | Status: DC | PRN

## 2017-08-20 MED ORDER — ACETAMINOPHEN 650 MG RE SUPP
650.0000 mg | Freq: Four times a day (QID) | RECTAL | Status: DC | PRN
Start: 2017-08-20 — End: 2017-08-23

## 2017-08-20 MED ORDER — PHENOL 1.4 % MT LIQD: 1.0000 | OROMUCOSAL | Status: DC | PRN

## 2017-08-20 MED ORDER — ONDANSETRON HCL 4 MG/2ML IJ SOLN: 4.0000 mg | Freq: Four times a day (QID) | INTRAMUSCULAR | Status: DC | PRN

## 2017-08-20 NOTE — Plan of Care (Signed)
  Nutrition: Adequate nutrition will be maintained 08/20/2017 1159 - Progressing by Williams Che, RN   Elimination: Will not experience complications related to bowel motility 08/20/2017 1159 - Progressing by Williams Che, RN   Pain Managment: General experience of comfort will improve 08/20/2017 1159 - Progressing by Williams Che, RN   Safety: Ability to remain free from injury will improve 08/20/2017 1159 - Progressing by Williams Che, RN   Skin Integrity: Risk for impaired skin integrity will decrease 08/20/2017 1159 - Progressing by Williams Che, RN

## 2017-08-20 NOTE — NC FL2 (Signed)
[place] MEDICAID FL2 LEVEL OF CARE SCREENING TOOL     IDENTIFICATION  Patient Name: Taylor Kramer Birthdate: [birth year] Sex: female Admission Date (Current Location): 08/19/2017  [facility] and [place] Number:  Herbalist and Address:  [facility], [place] [address] [place], [place] [address]      Provider Number: [id number]  Attending Physician Name and Address:  Annita Brod, MD  Relative Name and Phone Number:  Guadelupe Sabin, son, [phone]    Current Level of Care: Hospital Recommended Level of Care: [place] Prior Approval Number:    Date Approved/Denied:   PASRR Number: [id number]  Discharge Plan: SNF    Current Diagnoses: Patient Active Problem List   Diagnosis Date Noted  . Chronic diastolic CHF (congestive heart failure) ([place]) 08/20/2017  . Fall 08/19/2017  . Closed right hip fracture ([place]) 08/19/2017  . Hypokalemia 08/19/2017  . Protein-calorie malnutrition, severe 08/19/2017  . Cellulitis of left buttock   . Decubitus ulcer of back 03/19/2017  . Fecal impaction ([place]) 03/19/2017  . UTI (lower urinary tract infection) 11/21/2014  . Near syncope 11/21/2014  . Hypertension 11/21/2014  . History of CREST syndrome 11/21/2014    Orientation RESPIRATION BLADDER Height & Weight     Self, Time, Situation, Place  Normal Continent Weight: 99 lb (44.9 kg) Height:  5\' 7"  (170.2 cm)  BEHAVIORAL SYMPTOMS/MOOD NEUROLOGICAL BOWEL NUTRITION STATUS      Continent Diet(See DC Summary)  AMBULATORY STATUS COMMUNICATION OF NEEDS Skin   Extensive Assist Verbally Surgical wounds, Other (Comment)(Non-pressure wound of Buttocks)                       Personal Care Assistance Level of Assistance  Dressing, Bathing, Feeding Bathing Assistance: Maximum assistance Feeding assistance: Limited assistance Dressing Assistance: Maximum assistance     Functional Limitations Info  Sight, Hearing, Speech Sight Info:  Adequate Hearing Info: Adequate Speech Info: Adequate    SPECIAL CARE FACTORS FREQUENCY  OT (By licensed OT), PT (By licensed PT)     PT Frequency: 5x week OT Frequency: 5x week            Contractures      Additional Factors Info  Code Status, Allergies Code Status Info: DNR Allergies Info: No Known Allergies           Current Medications (08/20/2017):  This is the current hospital active medication list Current Facility-Administered Medications  Medication Dose Route Frequency Provider Last Rate Last Dose  . acetaminophen (TYLENOL) tablet 650 mg  650 mg Oral Q6H PRN Mcarthur Rossetti, MD       Or  . acetaminophen (TYLENOL) suppository 650 mg  650 mg Rectal Q6H PRN Mcarthur Rossetti, MD      . amLODipine (NORVASC) tablet 2.5 mg  2.5 mg Oral Daily Ivor Costa, MD   2.5 mg at 08/20/17 1457  . aspirin EC tablet 325 mg  325 mg Oral Q breakfast Mcarthur Rossetti, MD   325 mg at 08/20/17 1457  . cholecalciferol (VITAMIN D) tablet 1,000 Units  1,000 Units Oral Daily Ivor Costa, MD   1,000 Units at 08/20/17 1458  . fluticasone (FLONASE) 50 MCG/ACT nasal spray 2 spray  2 spray Each Nare Daily Ivor Costa, MD   2 spray at 08/19/17 1218  . hydrALAZINE (APRESOLINE) injection 5 mg  5 mg Intravenous Q2H PRN Ivor Costa, MD      . HYDROcodone-acetaminophen (NORCO/VICODIN) 5-325 MG per  tablet 1-2 tablet  1-2 tablet Oral Q6H PRN Mcarthur Rossetti, MD   2 tablet at 08/20/17 0544  . hydrOXYzine (ATARAX/VISTARIL) tablet 10 mg  10 mg Oral TID PRN Ivor Costa, MD      . lactated ringers infusion   Intravenous Continuous Myrtie Soman, MD 10 mL/hr at 08/19/17 1644    . loratadine (CLARITIN) tablet 10 mg  10 mg Oral Daily PRN Ivor Costa, MD      . menthol-cetylpyridinium (CEPACOL) lozenge 3 mg  1 lozenge Oral PRN Mcarthur Rossetti, MD       Or  . phenol (CHLORASEPTIC) mouth spray 1 spray  1 spray Mouth/Throat PRN Mcarthur Rossetti, MD      . methocarbamol (ROBAXIN)  tablet 500 mg  500 mg Oral Q8H PRN Ivor Costa, MD   500 mg at 08/20/17 1457  . metoCLOPramide (REGLAN) tablet 5-10 mg  5-10 mg Oral Q8H PRN Mcarthur Rossetti, MD       Or  . metoCLOPramide (REGLAN) injection 5-10 mg  5-10 mg Intravenous Q8H PRN Mcarthur Rossetti, MD      . morphine 2 MG/ML injection 0.5 mg  0.5 mg Intravenous Q2H PRN Mcarthur Rossetti, MD      . ondansetron [facility]) tablet 4 mg  4 mg Oral Q6H PRN Mcarthur Rossetti, MD       Or  . ondansetron [facility]) injection 4 mg  4 mg Intravenous Q6H PRN Mcarthur Rossetti, MD      . oxyCODONE-acetaminophen (PERCOCET/ROXICET) 5-325 MG per tablet 1 tablet  1 tablet Oral Q4H PRN Ivor Costa, MD   1 tablet at 08/20/17 1457  . polyethylene glycol (MIRALAX / GLYCOLAX) packet 17 g  17 g Oral Daily PRN Ivor Costa, MD      . polyvinyl alcohol (LIQUIFILM TEARS) 1.4 % ophthalmic solution 1 drop  1 drop Both Eyes TID AC & HS Ivor Costa, MD   1 drop at 08/20/17 1237  . psyllium (HYDROCIL/METAMUCIL) packet 1 packet  1 packet Oral Daily Ivor Costa, MD      . Brock   Oral PRN Annita Brod, MD      . senna-docusate (Senokot-S) tablet 1 tablet  1 tablet Oral BID Ivor Costa, MD   1 tablet at 08/20/17 1457  . zolpidem (AMBIEN) tablet 5 mg  5 mg Oral QHS Ivor Costa, MD   5 mg at 08/19/17 2323     Discharge Medications: Please see discharge summary for a list of discharge medications.  Relevant Imaging Results:  Relevant Lab Results:   Additional Information SS#: [place], LCSW

## 2017-08-20 NOTE — Progress Notes (Signed)
Patient ID: Taylor Kramer, female   DOB: 1932/01/25, 84 y.o.   MRN: [id number] Awake and alert this morning.  Tolerated surgery well on right hip.  Can start therapy for mobilization with WBAT.  Social Work consult for skilled nursing placement.

## 2017-08-20 NOTE — Evaluation (Signed)
Occupational Therapy Evaluation Patient Details Name: Taylor Kramer MRN: [id number] DOB: [birth year] Today's Date: 08/20/2017    History of Present Illness Pt is an 84 y.o. female s/p Right hip IM nail. PMHx: HTN, Crest syndrome.   Clinical Impression   Pt reports she required some assist with bathing but was mod I for other BADL. Currently pt requires min assist overall for bed mobility and max-total assist for ADL. Pt with difficulty tolerating sitting EOB this session; requesting to lay back down--appearing to be gasping for air but SpO2 100% on RA, pt reports no increase in pain just fatigue with activity. Recommending SNF for follow up to maximize independence and safety with ADL and functional mobility. Pt would benefit from continued skilled OT to address established goals.   Follow Up Recommendations  SNF;Supervision/Assistance - 24 hour    Equipment Recommendations  None recommended by OT    Recommendations for Other Services PT consult     Precautions / Restrictions Precautions Precautions: Fall Restrictions Weight Bearing Restrictions: Yes RLE Weight Bearing: Weight bearing as tolerated      Mobility Bed Mobility Overal bed mobility: Needs Assistance Bed Mobility: Supine to Sit;Sit to Supine     Supine to sit: Min assist;HOB elevated Sit to supine: Min assist;HOB elevated   General bed mobility comments: Assist for RLE to EOB and back to bed. HOB elevated with use of bed rails.  Transfers                      Balance Overall balance assessment: Needs assistance;History of Falls Sitting-balance support: Feet supported;Bilateral upper extremity supported Sitting balance-Leahy Scale: Fair                                     ADL either performed or assessed with clinical judgement   ADL Overall ADL's : Needs assistance/impaired Eating/Feeding: NPO   Grooming: Minimal assistance;Sitting   Upper Body Bathing: Maximal  assistance;Sitting   Lower Body Bathing: Total assistance;Bed level   Upper Body Dressing : Maximal assistance;Sitting   Lower Body Dressing: Total assistance;Bed level                 General ADL Comments: Pt able to get to EOB then quickly requesting to lay back down. Pt appears anxious and and having difficulty breathing. SpO2 100% on RA. Pt reports no increase in pain or SOB once returned to supine. Pt reporting fatigue      Vision Baseline Vision/History: Wears glasses Wears Glasses: At all times       Perception     Praxis      Pertinent Vitals/Pain Pain Assessment: 0-10 Pain Score: 6  Pain Location: R hip Pain Descriptors / Indicators: Guarding;Sore Pain Intervention(s): Monitored during session;Limited activity within patient's tolerance     Hand Dominance     Extremity/Trunk Assessment Upper Extremity Assessment Upper Extremity Assessment: Generalized weakness   Lower Extremity Assessment Lower Extremity Assessment: Defer to PT evaluation       Communication Communication Communication: Other (comment)(dysarthric)   Cognition Arousal/Alertness: Awake/alert Behavior During Therapy: Anxious Overall Cognitive Status: No family/caregiver present to determine baseline cognitive functioning                                     General Comments       Exercises  Shoulder Instructions      Home Living Family/patient expects to be discharged to:: Skilled nursing facility                                 Additional Comments: Pt at [facility] ALF PTA.      Prior Functioning/Environment Level of Independence: Needs assistance  Gait / Transfers Assistance Needed: RW for mobility in room. w/c to get to dining room for lunch/dinner ADL's / Homemaking Assistance Needed: recieves assist with bathing, able to get dressed on her own and perform toilet transfers without assist            OT Problem List: Decreased  strength;Decreased activity tolerance;Impaired balance (sitting and/or standing);Decreased knowledge of use of DME or AE;Decreased knowledge of precautions;Pain      OT Treatment/Interventions: Self-care/ADL training;Therapeutic exercise;Energy conservation;DME and/or AE instruction;Therapeutic activities;Patient/family education;Balance training    OT Goals(Current goals can be found in the care plan section) Acute Rehab OT Goals Patient Stated Goal: get better OT Goal Formulation: With patient Time For Goal Achievement: 09/03/17 Potential to Achieve Goals: Good ADL Goals Pt Will Perform Lower Body Bathing: with min assist;sit to/from stand Pt Will Perform Lower Body Dressing: with min assist;sit to/from stand Pt Will Transfer to Toilet: with min assist;bedside commode;ambulating Pt Will Perform Toileting - Clothing Manipulation and hygiene: with min assist;sit to/from stand  OT Frequency: Min 2X/week   Barriers to D/C:            Co-evaluation              AM-PAC PT "6 Clicks" Daily Activity     Outcome Measure Help from another person eating meals?: A Little Help from another person taking care of personal grooming?: A Little Help from another person toileting, which includes using toliet, bedpan, or urinal?: Total Help from another person bathing (including washing, rinsing, drying)?: A Lot Help from another person to put on and taking off regular upper body clothing?: A Lot Help from another person to put on and taking off regular lower body clothing?: Total 6 Click Score: 12   End of Session    Activity Tolerance: Patient limited by fatigue Patient left: in bed;with call bell/phone within reach;with bed alarm set;with SCD's reapplied  OT Visit Diagnosis: Other abnormalities of gait and mobility (R26.89);Pain Pain - Right/Left: Right Pain - part of body: Hip                Time: 1150-1209 OT Time Calculation (min): 19 min Charges:  OT General Charges $OT Visit:  1 Visit OT Evaluation $OT Eval Moderate Complexity: 1 Mod G-Codes:     Tomi Grandpre A. Ulice Brilliant, M.S., OTR/L Pager: [place] 08/20/2017, 12:23 PM

## 2017-08-20 NOTE — Progress Notes (Signed)
Nutrition Brief Note  Verbal with Read Back order received per Dr. [place] Pink for bedside swallow evaluation. Pt made NPO. Spoke with pt's son, Marya Amsler via telephone and he is agreeable for Speech Path to assess.  Arthur Holms, RD, LDN Pager #: [phone] After-Hours Pager #: [phone]

## 2017-08-20 NOTE — Social Work (Addendum)
CSW to meet with patient, however she is off floor for procedure.  CSW then contacted [facility] nursing facility to confirm that patient was a resident. CSW left message for Shauna, coordinator to discuss disposition plan. CSW was advised by staff that facility has its own therapy in house and they could provide therapy depending on need/impairment.  CSW will f/u.  Elissa Hefty, LCSW Clinical Social Worker [phone]

## 2017-08-20 NOTE — Consult Note (Signed)
[facility] Nurse wound consult note Reason for Consult: left buttock wound full thickness healing wound surrounded by darkened area of  DTI Wound type: pressure and shear for DTI surrounding full thickness wound she has had since 2017, has been debrided previous admission in 2018. Unsure if pressure or trauma was original cause. Pressure Injury POA: Yes Measurement: 1.5cm x 1cm x 0.2cm Wound bed: pink, clean Drainage (amount, consistency, odor) none on current dressing, however edges look a little macerated. Periwound:macerated Dressing procedure/placement/frequency: I have provided nurses with orders for NS moist to dry dressing changes BID. Patient has had this wound since 2017 and it is much smaller than previous admissions. It is clean, pt unable to say what they use on it at AL. Patient has Crest Syndrome and sits in chair frequently for long periods. Will order a chair pad for pt to use here and take home. Patient is malnourished, supplements or a nutritional consult recommended, please order if you agree.  Will also order that pt's HOB not be more than 30 degrees except for meals, her wound has shear injury surrounding it. We will not follow, but will remain available to this patient, to nursing, and the medical and/or surgical teams.  Please re-consult if we need to assist further.   Fara Olden, RN-C, WTA-C Wound Treatment Associate

## 2017-08-20 NOTE — Clinical Social Work Note (Signed)
Clinical Social Work Assessment  Patient Details  Name: Taylor Kramer MRN: [id number] Date of Birth: September 02, 1931  Date of referral:  08/20/17               Reason for consult:  Facility Placement                Permission sought to share information with:  Facility Art therapist granted to share information::  Yes, Release of Information Signed  Name::        Agency::  SNF  Relationship::     Contact Information:     Housing/Transportation Living arrangements for the past 2 months:  [place] of Information:  Facility, Patient Patient Interpreter Needed:  None Criminal Activity/Legal Involvement Pertinent to Current Situation/Hospitalization:  No - Comment as needed Significant Relationships:  Adult Children, Other Family Members Lives with:  Facility Resident Do you feel safe going back to the place where you live?  Yes Need for family participation in patient care:  No (Coment)  Care giving concerns:  Patient from assisted Living-[facility] and may be able to return to assisted living as they do provide some therapy in house. CSW will await for PT/OT notes so that assisted living can review.  Staff at assisted confirmed that they assisted patient with bathing and dressing daily.  CSW met with patient and son at bedside. He indicated that mom will need skilled nursing as [facility] cannot provide the level of care needed. He would like her to be at a skilled nursing facility close to [facility]. CSW provided list of SNF's so that son and patient can discuss and select a SNF.  CSW f/u for PT evaluation.  Social Worker assessment / plan:  CSW will assist with disposition.  Employment status:  Retired Forensic scientist:  Medicare PT Recommendations:  [facility] / Referral to community resources:  [place]  Patient/Family's Response to care:  Patient/son thanked CSW for her time with explaining SNF  process. CSW answered questions and they reported no concerns. They are in agreement with recommendations for SNF.  Patient/Family's Understanding of and Emotional Response to Diagnosis, Current Treatment, and Prognosis:  Patient from  Assisted living facility and aware of her physical limitations and health needs. Patient and son understand that [facility] cannot care for patient given her new impairment and they are in agreement with the recommendations for SNF. Pt hopes to improve and then return to [place]. No issues or concerns at this time.  Emotional Assessment Appearance:  Appears stated age Attitude/Demeanor/Rapport:  (Cooperative) Affect (typically observed):  Accepting, Appropriate Orientation:  Oriented to Situation, Oriented to  Time, Oriented to Place, Oriented to Self Alcohol / Substance use:  Not Applicable Psych involvement (Current and /or in the community):  No (Comment)  Discharge Needs  Concerns to be addressed:  Discharge Planning Concerns Readmission within the last 30 days:  No Current discharge risk:  Dependent with Mobility, Physical Impairment Barriers to Discharge:  No Barriers Identified   Normajean Baxter, LCSW 08/20/2017, 1:04 PM

## 2017-08-20 NOTE — Evaluation (Signed)
Physical Therapy Evaluation Patient Details Name: Taylor Kramer MRN: [id number] DOB: August 26, 1931 Today's Date: 08/20/2017   History of Present Illness  Pt is an 84 y.o. female s/p Right hip IM nail. PMHx: HTN, Crest syndrome.  Clinical Impression  Pt self limiting to EOB only with me due to pain.  She may need to be pre medicated before therapy sessions as RN reports she did not request any pain medicine when asked while supine/still in the bed, however, she is unable to sit for >10 seconds EOB reporting pain and returning to supine.  She will need SNF level rehab before returning to ALF level of care.  PT to follow acutely for deficits listed below.       Follow Up Recommendations SNF    Equipment Recommendations  None recommended by PT    Recommendations for Other Services   NA    Precautions / Restrictions Precautions Precautions: Fall Restrictions Weight Bearing Restrictions: Yes RLE Weight Bearing: Weight bearing as tolerated      Mobility  Bed Mobility Overal bed mobility: Needs Assistance Bed Mobility: Supine to Sit;Sit to Supine     Supine to sit: Min assist;HOB elevated Sit to supine: Min assist;HOB elevated   General bed mobility comments: Assist for RLE to EOB and back to bed. HOB elevated with use of bed rails. Pt had difficulty lifting right leg against gravity to sit to EOB.           Balance Overall balance assessment: Needs assistance;History of Falls Sitting-balance support: Feet supported;Bilateral upper extremity supported Sitting balance-Leahy Scale: Fair Sitting balance - Comments: Almost immediately upon sitting pt wanted to lay back down and therapist was unable to convice her to get up again.  When further looking at her chart, she has not had anything for pain since early this AM.  She would likely be good to pre medicate to see if that increased her tolerance of mobility.                                      Pertinent  Vitals/Pain Pain Assessment: Faces Pain Score: 6  Faces Pain Scale: Hurts whole lot Pain Location: R hip Pain Descriptors / Indicators: Guarding;Sore Pain Intervention(s): Limited activity within patient's tolerance;Monitored during session;Repositioned;Other (comment)(pt did not want any ice)    Home Living Family/patient expects to be discharged to:: Skilled nursing facility                 Additional Comments: Pt at [facility] PTA.    Prior Function Level of Independence: Needs assistance   Gait / Transfers Assistance Needed: RW for mobility in room. w/c to get to dining room for lunch/dinner  ADL's / Homemaking Assistance Needed: recieves assist with bathing, able to get dressed on her own and perform toilet transfers without assist  Comments: Pt sleeps a lot and sits alot.  She enjoys reading.      Hand Dominance        Extremity/Trunk Assessment   Upper Extremity Assessment Upper Extremity Assessment: Defer to OT evaluation    Lower Extremity Assessment Lower Extremity Assessment: RLE deficits/detail RLE Deficits / Details: right leg with normal post op pain and weakness, ankle at least 3/5, knee 3-/5, hip 2/5 per gross functional assessment (bed level) and exercises.  Pt very emaciated with visible muscle wasting.  Has a pressure sore on her buttocks.  RLE Sensation: history of  peripheral neuropathy    Cervical / Trunk Assessment Cervical / Trunk Assessment: Kyphotic  Communication   Communication: Other (comment)(dysarthric)  Cognition Arousal/Alertness: Awake/alert Behavior During Therapy: Anxious Overall Cognitive Status: History of cognitive impairments - at baseline                                 General Comments: Son present for PT eval         Exercises Total Joint Exercises Ankle Circles/Pumps: AROM;Both;20 reps Quad Sets: AROM;Right;10 reps Heel Slides: AAROM;Right;10 reps Hip ABduction/ADduction: AAROM;Right;10 reps    Assessment/Plan    PT Assessment Patient needs continued PT services  PT Problem List Decreased strength;Decreased range of motion;Decreased activity tolerance;Decreased balance;Decreased mobility;Decreased knowledge of use of DME;Pain       PT Treatment Interventions DME instruction;Gait training;Functional mobility training;Therapeutic activities;Balance training;Therapeutic exercise;Patient/family education;Manual techniques;Modalities    PT Goals (Current goals can be found in the Care Plan section)  Acute Rehab PT Goals Patient Stated Goal: get better, decrease pain, and sleep PT Goal Formulation: With patient/family Time For Goal Achievement: 08/27/17 Potential to Achieve Goals: Good    Frequency Min 3X/week           AM-PAC PT "6 Clicks" Daily Activity  Outcome Measure Difficulty turning over in bed (including adjusting bedclothes, sheets and blankets)?: Unable Difficulty moving from lying on back to sitting on the side of the bed? : Unable Difficulty sitting down on and standing up from a chair with arms (e.g., wheelchair, bedside commode, etc,.)?: Unable Help needed moving to and from a bed to chair (including a wheelchair)?: Total Help needed walking in hospital room?: Total Help needed climbing 3-5 steps with a railing? : Total 6 Click Score: 6    End of Session   Activity Tolerance: Patient limited by pain Patient left: in bed;with call bell/phone within reach;with family/visitor present Nurse Communication: Mobility status;Patient requests pain meds PT Visit Diagnosis: Muscle weakness (generalized) (M62.81);Difficulty in walking, not elsewhere classified (R26.2);Pain Pain - Right/Left: Right Pain - part of body: Hip    Time: 5320-2334 PT Time Calculation (min) (ACUTE ONLY): 15 min   Charges:         Wells Guiles B. Denver Bentson, PT, DPT [phone]   PT Evaluation $PT Eval Moderate Complexity: 1 Mod     08/20/2017, 2:46 PM

## 2017-08-20 NOTE — Progress Notes (Signed)
PROGRESS NOTE  Taylor Kramer KYH:[id number] DOB: February 02, 1932 DOA: 08/19/2017 PCP: Leeroy Cha, MD  HPI/Recap of past 65 hours: 84 year old female with past medical history of hypertension and crest syndrome who resides in assisted living facility brought into the emergency room on the early morning of 1/22 after she sustained a fall & found to have an acute comminuted intertrochanteric fracture of the proximal right femur. Patient taken for right IM nail on evening of 1/22. No events overnight.  Given patient's chronic malnutrition from her crest syndrome, recommendation made for patient made nothing by mouth awaiting modified barium swallow. She herself has no complaints, soreness manageable.  Assessment/Plan: Principal Problem:   Closed right hip fracture [facility]): Seen by orthopedic surgery. Status post IM nail.  Monitor for blood loss anemia. Will need Short-term skilled nursing before being able to return to her assisted living Active Problems:   Hypertension: Blood pressure stable, starting to trend upward    Hypokalemia: Replace as needed   Protein-calorie malnutrition, severe/underweight: Patient meets criteria in the context of chronic illness. Her malnutrition is related to her crest syndrome. Currently nothing by mouth, awaiting modified barium swallow  History of crest syndrome  Chronic diastolic heart failure: Have noted blood pressure started to trend upward. Reviewed previous echocardiogram done April 2016 noting grade 1 diastolic dysfunction. We'll check BNP.   Code Status: DO NOT RESUSCITATE   Family Communication: Left message for sounds  Disposition Plan: Short-term skilled nursing in the next 1-2 days, then back to assisted living   Consultants:  Orthopedic surgery   Procedures:  Status post right IM nail done 1/22   Antimicrobials:  Preop Ancef   DVT prophylaxis:  SCDs   Objective: Vitals:   08/19/17 1930 08/19/17 1939 08/19/17 1945  08/20/17 0524  BP:  (!) 147/93  138/83  Pulse: 85  77 (!) 103  Resp: 13  15 15   Temp:   (!) 97 F (36.1 C) (!) 97.4 F (36.3 C)  TempSrc:    Oral  SpO2: 95%  94% 97%  Weight:      Height:        Intake/Output Summary (Last 24 hours) at 08/20/2017 1234 Last data filed at 08/20/2017 0526 Gross per 24 hour  Intake 916.5 ml  Output 500 ml  Net 416.5 ml   Filed Weights   08/19/17 0206  Weight: 44.9 kg (99 lb)    Exam:   General:  Alert and oriented 2, no acute distress, underweight  HEENT: Normocephalic/atraumatic, hemorrhage or dry, poor dentition  Neck: Supple, no JVD   Cardiovascular: Regular rate and rhythm, S1-S2   Respiratory: Clear to auscultation bilaterally, breathing not labored  Abdomen: Soft, nontender, nondistended, positive bowel sounds   Musculoskeletal: No clubbing or cyanosis or edema   Psychiatry: Appropriate, no evidence of psychoses    Data Reviewed: CBC: Recent Labs  Lab 08/19/17 0239 08/19/17 0408  WBC 9.5 11.1*  NEUTROABS 8.0*  --   HGB 11.8* 11.5*  HCT 37.5 37.1  MCV 87.6 87.7  PLT 256 151   Basic Metabolic Panel: Recent Labs  Lab 08/19/17 0239 08/19/17 0408 08/20/17 0934  NA 140 141 140  K 3.4* 3.5 4.4  CL 104 104 102  CO2 26 26 25   GLUCOSE 111* 102* 134*  BUN 21* 20 21*  CREATININE 0.84 0.77 1.10*  CALCIUM 8.5* 8.5* 8.0*   GFR: Estimated Creatinine Clearance: 27.5 mL/min (A) (by C-G formula based on SCr of 1.1 mg/dL (H)). Liver Function Tests: No results  for input(s): AST, ALT, ALKPHOS, BILITOT, PROT, ALBUMIN in the last 168 hours. No results for input(s): LIPASE, AMYLASE in the last 168 hours. No results for input(s): AMMONIA in the last 168 hours. Coagulation Profile: Recent Labs  Lab 08/19/17 0408  INR 0.98   Cardiac Enzymes: No results for input(s): CKTOTAL, CKMB, CKMBINDEX, TROPONINI in the last 168 hours. BNP (last 3 results) No results for input(s): PROBNP in the last 8760 hours. HbA1C: No results for  input(s): HGBA1C in the last 72 hours. CBG: No results for input(s): GLUCAP in the last 168 hours. Lipid Profile: No results for input(s): CHOL, HDL, LDLCALC, TRIG, CHOLHDL, LDLDIRECT in the last 72 hours. Thyroid Function Tests: No results for input(s): TSH, T4TOTAL, FREET4, T3FREE, THYROIDAB in the last 72 hours. Anemia Panel: No results for input(s): VITAMINB12, FOLATE, FERRITIN, TIBC, IRON, RETICCTPCT in the last 72 hours. Urine analysis:    Component Value Date/Time   COLORURINE YELLOW 03/19/2017 0424   APPEARANCEUR CLEAR 03/19/2017 0424   LABSPEC 1.013 03/19/2017 0424   PHURINE 6.0 03/19/2017 0424   GLUCOSEU NEGATIVE 03/19/2017 0424   HGBUR MODERATE (A) 03/19/2017 0424   BILIRUBINUR NEGATIVE 03/19/2017 0424   KETONESUR NEGATIVE 03/19/2017 0424   PROTEINUR NEGATIVE 03/19/2017 0424   UROBILINOGEN 1.0 11/21/2014 2046   NITRITE NEGATIVE 03/19/2017 0424   LEUKOCYTESUR SMALL (A) 03/19/2017 0424   Sepsis Labs: @LABRCNTIP (procalcitonin:4,lacticidven:4)  )No results found for this or any previous visit (from the past 240 hour(s)).    Studies: Dg C-arm 1-60 Min  Result Date: 08/19/2017 CLINICAL DATA:  Status post ORIF of right hip fracture. EXAM: RIGHT FEMUR 2 VIEWS; DG C-ARM 61-120 MIN COMPARISON:  08/19/2017 FINDINGS: Four images from portable C-arm radiography obtained in the operating room show intramedullary nail and hip screw reduction and internal fixation of acute intertrochanteric fracture involving the proximal right femur. IMPRESSION: Status post ORIF of proximal right femur fracture. Electronically Signed   By: Kerby Moors M.D.   On: 08/19/2017 20:50   Dg Femur, Min 2 Views Right  Result Date: 08/19/2017 CLINICAL DATA:  Status post ORIF of right hip fracture. EXAM: RIGHT FEMUR 2 VIEWS; DG C-ARM 61-120 MIN COMPARISON:  08/19/2017 FINDINGS: Four images from portable C-arm radiography obtained in the operating room show intramedullary nail and hip screw reduction and  internal fixation of acute intertrochanteric fracture involving the proximal right femur. IMPRESSION: Status post ORIF of proximal right femur fracture. Electronically Signed   By: Kerby Moors M.D.   On: 08/19/2017 20:50    Scheduled Meds: . amLODipine  2.5 mg Oral Daily  . aspirin EC  325 mg Oral Q breakfast  . cholecalciferol  1,000 Units Oral Daily  . fluticasone  2 spray Each Nare Daily  . polyvinyl alcohol  1 drop Both Eyes TID AC & HS  . psyllium  1 packet Oral Daily  . senna-docusate  1 tablet Oral BID  . zolpidem  5 mg Oral QHS    Continuous Infusions: .  ceFAZolin (ANCEF) IV Stopped (08/20/17 0656)  . lactated ringers 10 mL/hr at 08/19/17 1644     LOS: 1 day     Annita Brod, MD [facility]  To reach me or the doctor on call, go to: [email or web address] Password [facility]  08/20/2017, 12:34 PM

## 2017-08-20 NOTE — Progress Notes (Signed)
Orthopedic Tech Progress Note Patient Details:  Taylor Kramer 1932-02-08 [id number]  Patient ID: Taylor Kramer, female   DOB: Apr 09, 1932, 84 y.o.   MRN: [id number] Pt cant have ohf due to age restrictions  Taylor Kramer 08/20/2017, 11:01 PM

## 2017-08-20 NOTE — Progress Notes (Signed)
Modified Barium Swallow Progress Note  Patient Details  Name: Taylor Kramer MRN: [id number] Date of Birth: [birth year]  Today's Date: 08/20/2017  Modified Barium Swallow completed.  Full report located under Chart Review in the Imaging Section.  Brief recommendations include the following:  Clinical Impression  Pt presents with a severe oropharyngeal dysphagia. There is timely and adequate laryngeal elevation, but delayed bolus propulsion and UES opening mechanism leading to pooled liquid in the pyriforms during propulsive action, which pushes aspirate into the the larynx and trachea. Aspiration quantity is severe with minimal sensation, regardless of texture. There are also severe vallecular and pyriform residuals post swallow, due to limited base of tongue propulsion, epiglottic deflection and pharyngeal contraction. Esophageal sweep also reveals signs of esophageal dysphagia. Suspect impact on connective tissue associated with CREST syndrome. There is minimal benefit of a chin tuck, multiple effortful swallows, cued cough/throat clear and limitation of bolus size with honey thick liquids. Likely pts impairment is chronic, but potentially acutely worsened by hip fracture. CXR upon admission was clear, but pt has severe risk of respiratory infection given dysphagia, deconditioning and malnourishment. Discussed with MD who does not want to prolong NPO status and would like to trial a restricted diet with intensive interventions for potential modest improvement over hospital admission. Will initiate a puree and honey thick teaspoon diet for now. Discussed with son, who is agreeable to plan, but voices concern about his mothers potential compliance. Palliative care consult would be beneficial given complexity of impairments.    Swallow Evaluation Recommendations       SLP Diet Recommendations: Dysphagia 1 (Puree) solids;Honey thick liquids   Liquid Administration via: Spoon   Medication  Administration: Crushed with puree   Supervision: Full assist for feeding   Compensations: Slow rate;Small sips/bites;Multiple dry swallows after each bite/sip;Clear throat intermittently;Effortful swallow   Postural Changes: Remain semi-upright after after feeds/meals (Comment);Seated upright at 90 degrees       Other Recommendations: Have oral suction available;Order thickener from pharmacy    Damilola Flamm, Katherene Ponto 08/20/2017,2:16 PM

## 2017-08-20 NOTE — Progress Notes (Signed)
Physical Therapy Treatment Patient Details Name: Taylor Kramer MRN: [id number] DOB: Mar 02, 1932 Today's Date: 08/20/2017    History of Present Illness Pt is an 84 y.o. female s/p Right hip IM nail. PMHx: HTN, Crest syndrome.    PT Comments    Pt continues to be reluctant to mobilize stating that she just wants to "sleep".  I have come in her room three times today and this is her report (and she is sleeping every time).  She reluctantly agreed to stand after firm encouragement that the sooner she stood (not to put it off) the better she would be in the long run.  I had the RN pre medicate her and per pt this did help with the pain with mobility.  We were able to get OOB to the [facility] with one person mod assit and RW.  She was not agreeable to get to the chair today.   Follow Up Recommendations  SNF     Equipment Recommendations  None recommended by PT    Recommendations for Other Services   NA     Precautions / Restrictions Precautions Precautions: Fall Restrictions Weight Bearing Restrictions: Yes RLE Weight Bearing: Weight bearing as tolerated    Mobility  Bed Mobility Overal bed mobility: Needs Assistance Bed Mobility: Supine to Sit;Sit to Supine     Supine to sit: Min assist;HOB elevated Sit to supine: Min assist;HOB elevated   General bed mobility comments: Assist for RLE to EOB and back to bed. HOB elevated with use of bed rails. Pt had difficulty lifting right leg against gravity to sit to EOB.    Transfers Overall transfer level: Needs assistance   Transfers: Sit to/from Stand;Stand Pivot Transfers Sit to Stand: Mod assist;From elevated surface Stand pivot transfers: Mod assist;From elevated surface       General transfer comment: Mod assist to stand with RW from elevated bed and lower BSC.  Significant assist needed at trunk to power up and verbal cues to extend hips and push through hands once standing, pt tends to keep flexed knees and buckles when taking 2-3  pivotal steps around to the [facility].    Ambulation/Gait             General Gait Details: unable at this time.           Balance Overall balance assessment: Needs assistance;History of Falls Sitting-balance support: Feet supported;Bilateral upper extremity supported Sitting balance-Leahy Scale: Fair Sitting balance - Comments: Almost immediately upon sitting pt wanted to lay back down and therapist was unable to convice her to get up again.  When further looking at her chart, she has not had anything for pain since early this AM.  She would likely be good to pre medicate to see if that increased her tolerance of mobility.  Postural control: Posterior lean Standing balance support: Bilateral upper extremity supported Standing balance-Leahy Scale: Poor Standing balance comment: mod assist to stand at EOB with RW and support trunk over flexed and buckling legs.                             Cognition Arousal/Alertness: Awake/alert Behavior During Therapy: Anxious Overall Cognitive Status: History of cognitive impairments - at baseline                                 General Comments: Son present for PT eval  Exercises Total Joint Exercises Ankle Circles/Pumps: AROM;Both;20 reps Quad Sets: AROM;Right;10 reps Heel Slides: AAROM;Right;10 reps Hip ABduction/ADduction: AAROM;Right;10 reps        Pertinent Vitals/Pain Pain Assessment: Faces Faces Pain Scale: Hurts even more Pain Location: R hip Pain Descriptors / Indicators: Guarding;Sore Pain Intervention(s): Limited activity within patient's tolerance;Monitored during session;Premedicated before session;Repositioned    Home Living Family/patient expects to be discharged to:: Skilled nursing facility               Additional Comments: Pt at [facility] ALF PTA.    Prior Function Level of Independence: Needs assistance  Gait / Transfers Assistance Needed: RW for mobility in room. w/c to get  to dining room for lunch/dinner ADL's / Homemaking Assistance Needed: recieves assist with bathing, able to get dressed on her own and perform toilet transfers without assist Comments: Pt sleeps a lot and sits alot.  She enjoys reading.    PT Goals (current goals can now be found in the care plan section) Acute Rehab PT Goals Patient Stated Goal: get better, decrease pain, and sleep PT Goal Formulation: With patient/family Time For Goal Achievement: 08/27/17 Potential to Achieve Goals: Good Progress towards PT goals: Progressing toward goals    Frequency    Min 3X/week      PT Plan Current plan remains appropriate       AM-PAC PT "6 Clicks" Daily Activity  Outcome Measure  Difficulty turning over in bed (including adjusting bedclothes, sheets and blankets)?: Unable Difficulty moving from lying on back to sitting on the side of the bed? : Unable Difficulty sitting down on and standing up from a chair with arms (e.g., wheelchair, bedside commode, etc,.)?: Unable Help needed moving to and from a bed to chair (including a wheelchair)?: A Lot Help needed walking in hospital room?: Total Help needed climbing 3-5 steps with a railing? : Total 6 Click Score: 7    End of Session   Activity Tolerance: Patient limited by pain;Patient limited by fatigue Patient left: in bed;with call bell/phone within reach;with family/visitor present;with bed alarm set Nurse Communication: Mobility status;Patient requests pain meds PT Visit Diagnosis: Muscle weakness (generalized) (M62.81);Difficulty in walking, not elsewhere classified (R26.2);Pain Pain - Right/Left: Right Pain - part of body: Hip     Time: 1541-1600 PT Time Calculation (min) (ACUTE ONLY): 19 min  Charges:  $Therapeutic Activity: 8-22 mins          Arwyn Besaw B. [place], [place], DPT [phone]            08/20/2017, 5:01 PM

## 2017-08-20 NOTE — Evaluation (Signed)
Clinical/Bedside Swallow Evaluation Patient Details  Name: Taylor Kramer MRN: [id number] Date of Birth: 1931/09/12  Today's Date: 08/20/2017 Time: SLP Start Time (ACUTE ONLY): 1100 SLP Stop Time (ACUTE ONLY): 1110 SLP Time Calculation (min) (ACUTE ONLY): 10 min  Past Medical History:  Past Medical History:  Diagnosis Date  . CREST syndrome ([place])   . Hypertension   . Insomnia   . Neuropathy   . Osteoporosis    Past Surgical History:  Past Surgical History:  Procedure Laterality Date  . ABDOMINAL HYSTERECTOMY    . CHOLECYSTECTOMY    . renal stones     HPI:  84 year old female with past medical history of hypertension and crest syndrome who resides in assisted living facility brought into the emergency room on the early morning of 1/22 after she sustained a fall landing on her right side. Is unable to stand. Patient normally ambulates with a wheelchair although for extremely short distances such as from her bed to bathroom, she uses a walker. Underwent surgery on 1/22 in the late pm.    Assessment / Plan / Recommendation Clinical Impression  Pt demosntrates immediate and significant coughing and wet vocal quality following sips of water. She is dysarthric at baseline and cough is not sifficient to clear airway. She denies history of difficulty swallowing, but pharyngeal and esophageal dysphagia is associated with pts diagnosis of CREST syndrome. Recommend MBS for objective assessment of swallowing prior to diet intiation as aspiration risk is significant. Pt in agreement.  SLP Visit Diagnosis: Dysphagia, oropharyngeal phase (R13.12)    Aspiration Risk  Severe aspiration risk    Diet Recommendation NPO        Other  Recommendations Oral Care Recommendations: Oral care QID   Follow up Recommendations        Frequency and Duration            Prognosis        Swallow Study   General HPI: 84 year old female with past medical history of hypertension and crest syndrome  who resides in assisted living facility brought into the emergency room on the early morning of 1/22 after she sustained a fall landing on her right side. Is unable to stand. Patient normally ambulates with a wheelchair although for extremely short distances such as from her bed to bathroom, she uses a walker. Underwent surgery on 1/22 in the late pm.  Type of Study: Bedside Swallow Evaluation Previous Swallow Assessment: none Diet Prior to this Study: NPO Temperature Spikes Noted: No Respiratory Status: Room air Behavior/Cognition: Alert;Cooperative;Pleasant mood Oral Cavity Assessment: Within Functional Limits Oral Care Completed by SLP: No Oral Cavity - Dentition: Poor condition Vision: Functional for self-feeding Self-Feeding Abilities: Able to feed self Patient Positioning: Upright in bed Baseline Vocal Quality: Wet Volitional Cough: Weak Volitional Swallow: Able to elicit    Oral/Motor/Sensory Function Overall Oral Motor/Sensory Function: Mild impairment(dysarthric, movement WNL)   Ice Chips     Thin Liquid Thin Liquid: Impaired Presentation: Cup;Self Fed Pharyngeal  Phase Impairments: Suspected delayed Swallow;Decreased hyoid-laryngeal movement;Wet Vocal Quality;Cough - Immediate    Nectar Thick Nectar Thick Liquid: Not tested   Honey Thick Honey Thick Liquid: Not tested   Puree Puree: Not tested   Solid   GO   Solid: Not tested       Herbie Baltimore, MA CCC-SLP [phone]  Lynann Beaver 08/20/2017,12:30 PM

## 2017-08-21 ENCOUNTER — Inpatient Hospital Stay (HOSPITAL_COMMUNITY): Payer: Medicare Other

## 2017-08-21 DIAGNOSIS — Z7189 Other specified counseling: Secondary | ICD-10-CM

## 2017-08-21 DIAGNOSIS — R1310 Dysphagia, unspecified: Secondary | ICD-10-CM

## 2017-08-21 DIAGNOSIS — R1314 Dysphagia, pharyngoesophageal phase: Secondary | ICD-10-CM

## 2017-08-21 DIAGNOSIS — Z8739 Personal history of other diseases of the musculoskeletal system and connective tissue: Secondary | ICD-10-CM

## 2017-08-21 DIAGNOSIS — Z515 Encounter for palliative care: Secondary | ICD-10-CM

## 2017-08-21 LAB — BASIC METABOLIC PANEL
Anion gap: 9 (ref 5–15)
BUN: 32 mg/dL — ABNORMAL HIGH (ref 6–20)
CO2: 29 mmol/L (ref 22–32)
Calcium: 8.7 mg/dL — ABNORMAL LOW (ref 8.9–10.3)
Chloride: 100 mmol/L — ABNORMAL LOW (ref 101–111)
Creatinine, Ser: 1.3 mg/dL — ABNORMAL HIGH (ref 0.44–1.00)
GFR calc Af Amer: 43 mL/min — ABNORMAL LOW (ref >60)
GFR calc non Af Amer: 37 mL/min — ABNORMAL LOW (ref >60)
Glucose, Bld: 104 mg/dL — ABNORMAL HIGH (ref 65–99)
Potassium: 4.7 mmol/L (ref 3.5–5.1)
Sodium: 138 mmol/L (ref 135–145)

## 2017-08-21 LAB — CBC
HCT: 30 % — ABNORMAL LOW (ref 36.0–46.0)
Hemoglobin: 9.3 g/dL — ABNORMAL LOW (ref 12.0–15.0)
MCH: 27.4 pg (ref 26.0–34.0)
MCHC: 31 g/dL (ref 30.0–36.0)
MCV: 88.5 fL (ref 78.0–100.0)
Platelets: 323 10*3/uL (ref 150–400)
RBC: 3.39 MIL/uL — ABNORMAL LOW (ref 3.87–5.11)
RDW: 16.9 % — ABNORMAL HIGH (ref 11.5–15.5)
WBC: 12.3 10*3/uL — ABNORMAL HIGH (ref 4.0–10.5)

## 2017-08-21 MED ORDER — HYDROCODONE-ACETAMINOPHEN 5-325 MG PO TABS: 1.0000 | ORAL_TABLET | Freq: Four times a day (QID) | ORAL | 0 refills | Status: DC | PRN

## 2017-08-21 MED ORDER — ASPIRIN 325 MG PO TBEC: 325.0000 mg | DELAYED_RELEASE_TABLET | Freq: Every day | ORAL | 0 refills | Status: AC

## 2017-08-21 MED ORDER — POLYETHYLENE GLYCOL 3350 17 G PO PACK
17.0000 g | PACK | Freq: Every day | ORAL | Status: DC
  Administered 2017-08-22 – 2017-08-23 (×2): 17 g via ORAL
  Filled 2017-08-21 (×2): qty 1

## 2017-08-21 NOTE — Progress Notes (Signed)
Subjective: 2 Days Post-Op Procedure(s) (LRB): INTRAMEDULLARY (IM) NAIL INTERTROCHANTRIC (Right) Patient reports pain as moderate.  Likely at her baseline mobility.  No acute issues over past 24 hours.  Objective: Vital signs in last 24 hours: Temp:  [97.5 F (36.4 C)-98.2 F (36.8 C)] 98.2 F (36.8 C) (01/24 0605) Pulse Rate:  [102-107] 107 (01/24 0900) Resp:  [16-18] 18 (01/24 0605) BP: (104-108)/(53-75) 106/53 (01/24 0900) SpO2:  [91 %-94 %] 91 % (01/24 0900) Weight:  [105 lb 13.1 oz (48 kg)-106 lb 11.2 oz (48.4 kg)] 106 lb 11.2 oz (48.4 kg) (01/24 0605)  Intake/Output from previous day: 01/23 0701 - 01/24 0700 In: 200 [P.O.:200] Out: -  Intake/Output this shift: Total I/O In: 50 [P.O.:50] Out: 300 [Urine:300]  Recent Labs    08/19/17 0239 08/19/17 0408 08/20/17 0934 08/21/17 0755  HGB 11.8* 11.5* 9.5* 9.3*   Recent Labs    08/20/17 0934 08/21/17 0755  WBC 10.5 12.3*  RBC 3.38* 3.39*  HCT 29.6* 30.0*  PLT 298 323   Recent Labs    08/20/17 0934 08/21/17 0755  NA 140 138  K 4.4 4.7  CL 102 100*  CO2 25 29  BUN 21* 32*  CREATININE 1.10* 1.30*  GLUCOSE 134* 104*  CALCIUM 8.0* 8.7*   Recent Labs    08/19/17 0408  INR 0.98    Sensation intact distally Intact pulses distally Dorsiflexion/Plantar flexion intact Incision: scant drainage  Assessment/Plan: 2 Days Post-Op Procedure(s) (LRB): INTRAMEDULLARY (IM) NAIL INTERTROCHANTRIC (Right) Up with therapy Discharge to SNF (ok from Ortho standpoint)  Mcarthur Rossetti 08/21/2017, 12:30 PM

## 2017-08-21 NOTE — Consult Note (Signed)
Consultation Note Date: 08/21/2017   Patient Name: Taylor Kramer  DOB: 09-Sep-1931  MRN: [id number]  Age / Sex: 84 y.o., female  PCP: Leeroy Cha, MD Referring Physician: Annita Brod, MD  Reason for Consultation: Establishing goals of care  HPI/Patient Profile: 84 y.o. female  with past medical history of HTN and CREST syndrome, who came from [facility] assisted living  admitted on 08/19/2017 with fall, now s/p R IMs nailing.   Clinical Assessment and Goals of Care:  84 year old lady with a past medical history significant for hypertension and crest syndrome, resident of [facility] facility came in after a fall and had to undergo right intramedullary nailing. Patient has chronic state of malnutrition. Patient was seen and evaluated by speech language pathology colleagues showing severe oropharyngeal dysphagia, esophageal dysphagia and was recommended for dysphagic 1 pured with honey thick liquids.  Palliative consultation for additional goals of care discussions in light of the patient's current speech-language pathology evaluation, recent hip surgery, underlying history of crest syndrome.  Patient is an elderly lady resting in bed. She is able to speak slowly but is able to speak coherently. She is resting comfortably. She at about 30-40 percent of her lunch. She is having some secretions that she has to wipe out of her mouth every so often. She is able to engage in answer questions appropriately. I introduced myself and palliative care as follows: Palliative medicine is specialized medical care for people living with serious illness. It focuses on providing relief from the symptoms and stress of a serious illness. The goal is to improve quality of life for both the patient and the family.  Patient wishes to continue current mode of care, current oral diet with precautions. Her  serum albumin is 3 g/dL. Goals wishes and values discussed. Patient states she is aware she is going to skilled nursing facility for rehabilitation attempt towards the end of this hospitalization. She denies any pain she denies any congestion she denies any nausea.  Brief life review performed. Patient has 2 sons. Her granddaughter brought her a potted plant recently. She loves to read books of all kinds.  Call placed and discussed with son Marya Amsler. Reviewed patient's underlying medical history. Reviewed patient's current hospitalization. Discussed in detail about aspiration pathophysiology, risk for recurrent pneumonias and additional goals of care discussions were undertaken.  [name] wishes to continue to endorse the patient's previously expressed wishes. Safe careful oral feeding, new patient to be provided assistance if possible. [name] states he remains optimistic that the patient will continue to gradually improve. He wishes to be notified when the patient is to be discharged to [facility]. I discussed that it might be on 1-20 5-19. All of his questions and concerns addressed to the best of my ability. See recommendations below. Thank you for the consult.  NEXT OF KIN  son Marya Amsler [phone].   SUMMARY OF RECOMMENDATIONS    Agree with DNR DNI Continue current diet recommendations as put forth by SLP colleagues, patient  wishes to continue with careful PO diet, she and son both understand aspiration risk exists at all times, they both remain hopeful for some degree of stabilization/recovery from this aspect.  Recommend SNF rehab with palliative care to follow on discharge. Thank you for the consult.  Code Status/Advance Care Planning:  DNR    Symptom Management:    as above   Palliative Prophylaxis:   Aspiration  Psycho-social/Spiritual:   Desire for further Chaplaincy support:no  Additional Recommendations: Caregiving  Support/Resources  Prognosis:    Unable to determine  Discharge Planning: [facility] for rehab with Palliative care service follow-up      Primary Diagnoses: Present on Admission: . Hypertension . Fall . Closed right hip fracture ([place]) . Hypokalemia . Protein-calorie malnutrition, severe . Chronic diastolic CHF (congestive heart failure) ([place])   I have reviewed the medical record, interviewed the patient and family, and examined the patient. The following aspects are pertinent.  Past Medical History:  Diagnosis Date  . CREST syndrome ([place])   . Hypertension   . Insomnia   . Neuropathy   . Osteoporosis   . Protein calorie malnutrition ([place])    Social History   Socioeconomic History  . Marital status: Married    Spouse name: None  . Number of children: None  . Years of education: None  . Highest education level: None  Social Needs  . Financial resource strain: None  . Food insecurity - worry: None  . Food insecurity - inability: None  . Transportation needs - medical: None  . Transportation needs - non-medical: None  Occupational History  . None  Tobacco Use  . Smoking status: Former Research scientist (life sciences)  . Smokeless tobacco: Never Used  Substance and Sexual Activity  . Alcohol use: Yes    Comment: one drink a wek  . Drug use: No  . Sexual activity: No  Other Topics Concern  . None  Social History Narrative  . None   Family History  Problem Relation Age of Onset  . Neuropathy Brother    Scheduled Meds: . amLODipine  2.5 mg Oral Daily  . aspirin EC  325 mg Oral Q breakfast  . cholecalciferol  1,000 Units Oral Daily  . fluticasone  2 spray Each Nare Daily  . polyethylene glycol  17 g Oral Daily  . polyvinyl alcohol  1 drop Both Eyes TID AC & HS  . psyllium  1 packet Oral Daily  . senna-docusate  1 tablet Oral BID  . zolpidem  5 mg Oral QHS   Continuous Infusions: . lactated ringers 10 mL/hr at 08/19/17 1644   PRN Meds:.acetaminophen **OR** acetaminophen, hydrALAZINE,  HYDROcodone-acetaminophen, hydrOXYzine, loratadine, menthol-cetylpyridinium **OR** phenol, methocarbamol, metoCLOPramide **OR** metoCLOPramide (REGLAN) injection, morphine injection, ondansetron **OR** ondansetron (ZOFRAN) IV, oxyCODONE-acetaminophen, RESOURCE THICKENUP CLEAR Medications Prior to Admission:  Prior to Admission medications   Medication Sig Start Date End Date Taking? Authorizing Provider  acetaminophen (TYLENOL) 500 MG tablet Take 500 mg by mouth every 6 (six) hours as needed for moderate pain.   Yes [provider]  alendronate (FOSAMAX) 70 MG tablet Take 70 mg by mouth once a week. Take with a full glass of water on an empty stomach on Fridays.   Yes [provider]  amLODipine (NORVASC) 2.5 MG tablet Take 2.5 mg by mouth daily.   Yes [provider]  cholecalciferol (VITAMIN D) 1000 UNITS tablet Take 1,000 Units by mouth daily.   Yes [provider]  Cranberry 450 MG CAPS Take 1  capsule by mouth 2 (two) times daily.   Yes [provider]  fluticasone (FLONASE) 50 MCG/ACT nasal spray Place 2 sprays into both nostrils daily.   Yes [provider]  loratadine (CLARITIN) 10 MG tablet Take 10 mg by mouth daily as needed for allergies.    Yes [provider]  polyethylene glycol (MIRALAX / GLYCOLAX) packet Take 17 g by mouth daily as needed for mild constipation.   Yes [provider]  Propylene Glycol (SYSTANE BALANCE) 0.6 % SOLN Apply 1 drop to eye 4 (four) times daily.   Yes [provider]  psyllium (KONSYL) 0.52 G capsule Take 0.52 g by mouth daily.   Yes [provider]  senna-docusate (SENOKOT-S) 8.6-50 MG tablet Take 1 tablet by mouth 2 (two) times daily. Patient taking differently: Take 1 tablet by mouth 2 (two) times daily as needed for mild constipation.  03/24/17  Yes Domenic Polite, MD  vitamin C (ASCORBIC ACID) 500 MG tablet Take 500 mg by mouth daily.   Yes [provider]    zinc sulfate (ZINC-220) 220 (50 Zn) MG capsule Take 220 mg by mouth daily.   Yes [provider]  zolpidem (AMBIEN) 10 MG tablet Take 10 mg by mouth at bedtime.    Yes [provider]  aspirin EC 325 MG EC tablet Take 1 tablet (325 mg total) by mouth daily with breakfast. 08/22/17   Mcarthur Rossetti, MD  HYDROcodone-acetaminophen (NORCO/VICODIN) 5-325 MG tablet Take 1-2 tablets by mouth every 6 (six) hours as needed for moderate pain. 08/21/17   Mcarthur Rossetti, MD   No Known Allergies Review of Systems Some cough  Physical Exam Thin elderly lady Awake alert A little slow in her speech but is able to answer all questions appropriately Scattered coarse breath sounds S1 S2 Abdomen soft Poor dentition No edema Some muscle wasting  Vital Signs: BP (!) 104/55 (BP Location: Left Arm)   Pulse (!) 108   Temp 98 F (36.7 C) (Oral)   Resp 18   Ht 5\' 7"  (1.702 m)   Wt 48.4 kg (106 lb 11.2 oz)   SpO2 (!) 85%   BMI 16.71 kg/m  Pain Assessment: No/denies pain POSS *See Group Information*: S-Acceptable,Sleep, easy to arouse Pain Score: 0-No pain   SpO2: SpO2: (!) 85 % O2 Device:SpO2: (!) 85 % O2 Flow Rate: .O2 Flow Rate (L/min): 2 L/min  IO: Intake/output summary:   Intake/Output Summary (Last 24 hours) at 08/21/2017 1447 Last data filed at 08/21/2017 1300 Gross per 24 hour  Intake 220 ml  Output 300 ml  Net -80 ml    LBM: Last BM Date: 08/18/17 Baseline Weight: Weight: 44.9 kg (99 lb) Most recent weight: Weight: 48.4 kg (106 lb 11.2 oz)     Palliative Assessment/Data:   PPS 30-40%  Time In:  1300 Time Out:  1410 Time Total:  70 min  Greater than 50%  of this time was spent counseling and coordinating care related to the above assessment and plan.  Signed by: Loistine Chance, MD  [phone]  Please contact Palliative Medicine Team phone at [phone] for questions and concerns.  For individual provider: See Shea Evans

## 2017-08-21 NOTE — Social Work (Signed)
CSW spoke with son Marya Amsler and they have accepted SNF bed offer from [facility].  CSW f/u with SNF to confirm. CSW left message for admission staff to call back.  CSW will f/u.  Elissa Hefty, LCSW Clinical Social Worker [phone]

## 2017-08-21 NOTE — Progress Notes (Signed)
PROGRESS NOTE  Taylor Kramer:[id number] DOB: [birth year] DOA: 08/19/2017 PCP: Leeroy Cha, MD  HPI/Recap of past 54 hours: 84 year old female with past medical history of hypertension and crest syndrome who resides in assisted living facility brought into the emergency room on the early morning of 1/22 after she sustained a fall & found to have an acute comminuted intertrochanteric fracture of the proximal right femur. Patient taken for right IM nail on evening of 1/22. No events overnight.  Given patient's chronic malnutrition from her crest syndrome, recommendation made for speech therapy evaluation. Patient unfortunately failed a modified barium swallow with complete aspiration risk. In discussion, even with dysphagia 1 with honey thick liquids, still remains aspiration risk. Patient understands these issues and does not want to be on major restrictions. She understands the risks regardless. Noted white blood cell count trending upward and oxygen levels on room air dropped to 91%. Chest x-ray ordered  Assessment/Plan: Principal Problem:   Closed right hip fracture [facility]): Seen by orthopedic surgery. Status post IM nail.  Monitor for blood loss anemia. Will need Short-term skilled nursing before being able to return to her assisted living Active Problems:   Hypertension: Blood pressure stable    Hypokalemia: Replace as needed    Protein-calorie malnutrition, severe/underweight: Patient meets criteria in the context of chronic illness. Her malnutrition is related to her crest syndrome. On vitamin supplementation  History of crest syndrome  Chronic diastolic heart failure:  Reviewed previous echocardiogram done April 2016 noting grade 1 diastolic dysfunction. BNP within normal limits.  Dysphagia with high aspiration risk: As above. Patient has had these issues for a very long time. She is a DO NOT RESUSCITATE. She does not want full restrictions, understanding that even with  them in place, she still remains an aspiration risk. Discussed goals of care and she would prefer not to come to the hospital to treat her recurrent pneumonia.  Noted few rales on examination, mild leukocytosis today, oxygen saturation (94%-now at 91% and mild tachycardia. Checking chest x-ray to rule out pneumonia.   Code Status: DO NOT RESUSCITATE   Family Communication: Left message for sons.  Disposition Plan: Short-term skilled nursing in the next 24 hours   Consultants:  Orthopedic surgery   Procedures:  Status post right IM nail done 1/22   Antimicrobials:  Preop Ancef   DVT prophylaxis:  SCDs   Objective: Vitals:   08/20/17 1300 08/20/17 2200 08/21/17 0605 08/21/17 0900  BP: (!) 104/56 105/75 (!) 108/56 (!) 106/53  Pulse: (!) 106 (!) 102 (!) 106 (!) 107  Resp: 16 16 18    Temp: (!) 97.5 F (36.4 C) 98 F (36.7 C) 98.2 F (36.8 C)   TempSrc: Oral Oral Oral   SpO2: 94% 92% 93% 91%  Weight:  48 kg (105 lb 13.1 oz) 48.4 kg (106 lb 11.2 oz)   Height:        Intake/Output Summary (Last 24 hours) at 08/21/2017 1314 Last data filed at 08/21/2017 1116 Gross per 24 hour  Intake 100 ml  Output 300 ml  Net -200 ml   Filed Weights   08/19/17 0206 08/20/17 2200 08/21/17 0605  Weight: 44.9 kg (99 lb) 48 kg (105 lb 13.1 oz) 48.4 kg (106 lb 11.2 oz)    Exam:   General:  Alert and oriented 2, no acute distress, underweight  HEENT: Normocephalic/atraumatic, hemorrhage or dry, poor dentition  Neck: Supple, no JVD   Cardiovascular: Regular rate and rhythm, S1-S2   Respiratory: Few rales  bibasilar, breathing slightly more labored today. Audible wheezing  Abdomen: Soft, nontender, nondistended, positive bowel sounds   Musculoskeletal: No clubbing or cyanosis or edema   Psychiatry: Appropriate, no evidence of psychoses    Data Reviewed: CBC: Recent Labs  Lab 08/19/17 0239 08/19/17 0408 08/20/17 0934 08/21/17 0755  WBC 9.5 11.1* 10.5 12.3*  NEUTROABS  8.0*  --   --   --   HGB 11.8* 11.5* 9.5* 9.3*  HCT 37.5 37.1 29.6* 30.0*  MCV 87.6 87.7 87.6 88.5  PLT 256 276 298 188   Basic Metabolic Panel: Recent Labs  Lab 08/19/17 0239 08/19/17 0408 08/20/17 0934 08/21/17 0755  NA 140 141 140 138  K 3.4* 3.5 4.4 4.7  CL 104 104 102 100*  CO2 26 26 25 29   GLUCOSE 111* 102* 134* 104*  BUN 21* 20 21* 32*  CREATININE 0.84 0.77 1.10* 1.30*  CALCIUM 8.5* 8.5* 8.0* 8.7*   GFR: Estimated Creatinine Clearance: 25.1 mL/min (A) (by C-G formula based on SCr of 1.3 mg/dL (H)). Liver Function Tests: No results for input(s): AST, ALT, ALKPHOS, BILITOT, PROT, ALBUMIN in the last 168 hours. No results for input(s): LIPASE, AMYLASE in the last 168 hours. No results for input(s): AMMONIA in the last 168 hours. Coagulation Profile: Recent Labs  Lab 08/19/17 0408  INR 0.98   Cardiac Enzymes: No results for input(s): CKTOTAL, CKMB, CKMBINDEX, TROPONINI in the last 168 hours. BNP (last 3 results) No results for input(s): PROBNP in the last 8760 hours. HbA1C: No results for input(s): HGBA1C in the last 72 hours. CBG: No results for input(s): GLUCAP in the last 168 hours. Lipid Profile: No results for input(s): CHOL, HDL, LDLCALC, TRIG, CHOLHDL, LDLDIRECT in the last 72 hours. Thyroid Function Tests: No results for input(s): TSH, T4TOTAL, FREET4, T3FREE, THYROIDAB in the last 72 hours. Anemia Panel: No results for input(s): VITAMINB12, FOLATE, FERRITIN, TIBC, IRON, RETICCTPCT in the last 72 hours. Urine analysis:    Component Value Date/Time   COLORURINE YELLOW 03/19/2017 0424   APPEARANCEUR CLEAR 03/19/2017 0424   LABSPEC 1.013 03/19/2017 0424   PHURINE 6.0 03/19/2017 0424   GLUCOSEU NEGATIVE 03/19/2017 0424   HGBUR MODERATE (A) 03/19/2017 0424   BILIRUBINUR NEGATIVE 03/19/2017 0424   KETONESUR NEGATIVE 03/19/2017 0424   PROTEINUR NEGATIVE 03/19/2017 0424   UROBILINOGEN 1.0 11/21/2014 2046   NITRITE NEGATIVE 03/19/2017 0424    LEUKOCYTESUR SMALL (A) 03/19/2017 0424   Sepsis Labs: @LABRCNTIP (procalcitonin:4,lacticidven:4)  )No results found for this or any previous visit (from the past 240 hour(s)).    Studies: Dg Swallowing Func-speech Pathology  Result Date: 08/20/2017 Objective Swallowing Evaluation: Type of Study: MBS-Modified Barium Swallow Study  Patient Details Name: Taylor Kramer MRN: [id number] Date of Birth: 24-Mar-1932 Today's Date: 08/20/2017 Time: SLP Start Time (ACUTE ONLY): 1310 -SLP Stop Time (ACUTE ONLY): 1345 SLP Time Calculation (min) (ACUTE ONLY): 35 min Past Medical History: Past Medical History: Diagnosis Date . CREST syndrome ([place])  . Hypertension  . Insomnia  . Neuropathy  . Osteoporosis  Past Surgical History: Past Surgical History: Procedure Laterality Date . ABDOMINAL HYSTERECTOMY   . CHOLECYSTECTOMY   . INTRAMEDULLARY (IM) NAIL INTERTROCHANTERIC Right 08/19/2017  Procedure: INTRAMEDULLARY (IM) NAIL INTERTROCHANTRIC;  Surgeon: Mcarthur Rossetti, MD;  Location: [place];  Service: Orthopedics;  Laterality: Right; . renal stones   HPI: 84 year old female with past medical history of hypertension and crest syndrome who resides in assisted living facility brought into the emergency room on the early morning of 1/22 after  she sustained a fall landing on her right side. Is unable to stand. Patient normally ambulates with a wheelchair although for extremely short distances such as from her bed to bathroom, she uses a walker. Underwent surgery on 1/22 in the late pm.  No Data Recorded Assessment / Plan / Recommendation CHL IP CLINICAL IMPRESSIONS 08/20/2017 Clinical Impression Pt presents with a severe oropharyngeal dysphagia. There is timely and adequate laryngeal elevation, but delayed bolus propulsion and UES opening mechanism leading to pooled liquid in the pyriforms during propulsive action, which pushes aspirate into the the larynx and trachea. Aspiration quantity is severe with minimal sensation,  regardless of texture. There are also severe vallecular and pyriform residuals post swallow, due to limited base of tongue propulsion, epiglottic deflection and pharyngeal contraction. Esophageal sweep also reveals signs of esophageal dysphagia. Suspect impact on connective tissue associated with CREST syndrome. There is minimal benefit of a chin tuck, multiple effortful swallows, cued cough/throat clear and limitation of bolus size with honey thick liquids. Likely pts impairment is chronic, but potentially acutely worsened by hip fracture. CXR upon admission was clear, but pt has severe risk of respiratory infection given dysphagia, deconditioning and malnourishment. Discussed with MD who does not want to prolong NPO status and would like to trial a restricted diet with intensive interventions for potential modest improvement over hospital admission. Will initiate a puree and honey thick teaspoon diet for now. Discussed with son, who is agreeable to plan, but voices concern about his mothers potential compliance. Palliative care consult would be beneficial given complexity of impairments.   SLP Visit Diagnosis Dysphagia, oropharyngeal phase (R13.12) Attention and concentration deficit following -- Frontal lobe and executive function deficit following -- Impact on safety and function Severe aspiration risk   CHL IP TREATMENT RECOMMENDATION 08/20/2017 Treatment Recommendations Therapy as outlined in treatment plan below   Prognosis 08/20/2017 Prognosis for Safe Diet Advancement Guarded Barriers to Reach Goals -- Barriers/Prognosis Comment -- CHL IP DIET RECOMMENDATION 08/20/2017 SLP Diet Recommendations Dysphagia 1 (Puree) solids;Honey thick liquids Liquid Administration via Spoon Medication Administration Crushed with puree Compensations Slow rate;Small sips/bites;Multiple dry swallows after each bite/sip;Clear throat intermittently;Effortful swallow Postural Changes Remain semi-upright after after feeds/meals  (Comment);Seated upright at 90 degrees   CHL IP OTHER RECOMMENDATIONS 08/20/2017 Recommended Consults -- Oral Care Recommendations -- Other Recommendations Have oral suction available;Order thickener from pharmacy   CHL IP FOLLOW UP RECOMMENDATIONS 08/20/2017 Follow up Recommendations Skilled Nursing facility   [facility] IP FREQUENCY AND DURATION 08/20/2017 Speech Therapy Frequency (ACUTE ONLY) min 3x week Treatment Duration 3 weeks      CHL IP ORAL PHASE 08/20/2017 Oral Phase WFL Oral - Pudding Teaspoon -- Oral - Pudding Cup -- Oral - Honey Teaspoon -- Oral - Honey Cup -- Oral - Nectar Teaspoon -- Oral - Nectar Cup -- Oral - Nectar Straw -- Oral - Thin Teaspoon -- Oral - Thin Cup -- Oral - Thin Straw -- Oral - Puree -- Oral - Mech Soft -- Oral - Regular -- Oral - Multi-Consistency -- Oral - Pill -- Oral Phase - Comment --  CHL IP PHARYNGEAL PHASE 08/20/2017 Pharyngeal Phase Impaired Pharyngeal- Pudding Teaspoon -- Pharyngeal -- Pharyngeal- Pudding Cup -- Pharyngeal -- Pharyngeal- Honey Teaspoon Delayed swallow initiation-pyriform sinuses;Reduced pharyngeal peristalsis;Reduced epiglottic inversion;Reduced anterior laryngeal mobility;Reduced airway/laryngeal closure;Reduced tongue base retraction;Penetration/Aspiration before swallow;Penetration/Aspiration during swallow;Penetration/Apiration after swallow;Significant aspiration (Amount);Pharyngeal residue - valleculae;Pharyngeal residue - pyriform Pharyngeal Material enters airway, passes BELOW cords and not ejected out despite cough attempt by patient;Material enters airway,  passes BELOW cords without attempt by patient to eject out (silent aspiration) Pharyngeal- Honey Cup Delayed swallow initiation-pyriform sinuses;Reduced pharyngeal peristalsis;Reduced epiglottic inversion;Reduced anterior laryngeal mobility;Reduced airway/laryngeal closure;Reduced tongue base retraction;Penetration/Aspiration before swallow;Penetration/Aspiration during swallow;Penetration/Apiration after  swallow;Significant aspiration (Amount);Pharyngeal residue - valleculae;Pharyngeal residue - pyriform Pharyngeal Material enters airway, passes BELOW cords and not ejected out despite cough attempt by patient;Material enters airway, passes BELOW cords without attempt by patient to eject out (silent aspiration) Pharyngeal- Nectar Teaspoon Delayed swallow initiation-pyriform sinuses;Reduced pharyngeal peristalsis;Reduced epiglottic inversion;Reduced anterior laryngeal mobility;Reduced airway/laryngeal closure;Reduced tongue base retraction;Penetration/Aspiration before swallow;Penetration/Aspiration during swallow;Penetration/Apiration after swallow;Significant aspiration (Amount);Pharyngeal residue - valleculae;Pharyngeal residue - pyriform Pharyngeal Material enters airway, passes BELOW cords and not ejected out despite cough attempt by patient;Material enters airway, passes BELOW cords without attempt by patient to eject out (silent aspiration) Pharyngeal- Nectar Cup Delayed swallow initiation-pyriform sinuses;Reduced pharyngeal peristalsis;Reduced epiglottic inversion;Reduced anterior laryngeal mobility;Reduced airway/laryngeal closure;Reduced tongue base retraction;Penetration/Aspiration before swallow;Penetration/Aspiration during swallow;Penetration/Apiration after swallow;Significant aspiration (Amount);Pharyngeal residue - valleculae;Pharyngeal residue - pyriform Pharyngeal Material enters airway, passes BELOW cords and not ejected out despite cough attempt by patient;Material enters airway, passes BELOW cords without attempt by patient to eject out (silent aspiration) Pharyngeal- Nectar Straw Delayed swallow initiation-pyriform sinuses;Reduced pharyngeal peristalsis;Reduced epiglottic inversion;Reduced anterior laryngeal mobility;Reduced airway/laryngeal closure;Reduced tongue base retraction;Penetration/Aspiration before swallow;Penetration/Aspiration during swallow;Penetration/Apiration after  swallow;Significant aspiration (Amount);Pharyngeal residue - valleculae;Pharyngeal residue - pyriform Pharyngeal Material enters airway, passes BELOW cords and not ejected out despite cough attempt by patient;Material enters airway, passes BELOW cords without attempt by patient to eject out (silent aspiration) Pharyngeal- Thin Teaspoon -- Pharyngeal -- Pharyngeal- Thin Cup Delayed swallow initiation-pyriform sinuses;Reduced pharyngeal peristalsis;Reduced epiglottic inversion;Reduced anterior laryngeal mobility;Reduced airway/laryngeal closure;Reduced tongue base retraction;Penetration/Aspiration before swallow;Penetration/Aspiration during swallow;Penetration/Apiration after swallow;Significant aspiration (Amount);Pharyngeal residue - valleculae;Pharyngeal residue - pyriform Pharyngeal -- Pharyngeal- Thin Straw -- Pharyngeal -- Pharyngeal- Puree Delayed swallow initiation-pyriform sinuses;Reduced pharyngeal peristalsis;Reduced epiglottic inversion;Reduced anterior laryngeal mobility;Reduced airway/laryngeal closure;Reduced tongue base retraction;Penetration/Aspiration before swallow;Penetration/Aspiration during swallow;Penetration/Apiration after swallow;Significant aspiration (Amount);Pharyngeal residue - valleculae;Pharyngeal residue - pyriform Pharyngeal Material does not enter airway Pharyngeal- Mechanical Soft Delayed swallow initiation-pyriform sinuses;Reduced pharyngeal peristalsis;Reduced epiglottic inversion;Reduced anterior laryngeal mobility;Reduced airway/laryngeal closure;Reduced tongue base retraction;Penetration/Aspiration before swallow;Penetration/Aspiration during swallow;Penetration/Apiration after swallow;Significant aspiration (Amount);Pharyngeal residue - valleculae;Pharyngeal residue - pyriform Pharyngeal Material does not enter airway Pharyngeal- Regular -- Pharyngeal -- Pharyngeal- Multi-consistency -- Pharyngeal -- Pharyngeal- Pill -- Pharyngeal -- Pharyngeal Comment --  CHL IP CERVICAL  ESOPHAGEAL PHASE 08/20/2017 Cervical Esophageal Phase Impaired Pudding Teaspoon -- Pudding Cup -- Honey Teaspoon -- Honey Cup -- Nectar Teaspoon -- Nectar Cup -- Nectar Straw -- Thin Teaspoon -- Thin Cup -- Thin Straw -- Puree -- Mechanical Soft -- Regular -- Multi-consistency -- Pill -- Cervical Esophageal Comment -- No flowsheet data found. Herbie [place], [place] CCC-SLP [phone] [place], Katherene Ponto 08/20/2017, 2:17 PM               Scheduled Meds: . amLODipine  2.5 mg Oral Daily  . aspirin EC  325 mg Oral Q breakfast  . cholecalciferol  1,000 Units Oral Daily  . fluticasone  2 spray Each Nare Daily  . polyethylene glycol  17 g Oral Daily  . polyvinyl alcohol  1 drop Both Eyes TID AC & HS  . psyllium  1 packet Oral Daily  . senna-docusate  1 tablet Oral BID  . zolpidem  5 mg Oral QHS    Continuous Infusions: . lactated ringers 10 mL/hr at 08/19/17 1644     LOS: 2 days  Annita Brod, MD [facility]  To reach me or the doctor on call, go to: [email or web address] Password [email or web address]  08/21/2017, 1:14 PM

## 2017-08-21 NOTE — Discharge Instructions (Signed)
Can put weight as tolerated on right hip. Only up with assistance and supervision. New dry dressing right hip incisions as needed.

## 2017-08-21 NOTE — Progress Notes (Signed)
SLP Cancellation Note  Patient Details Name: Taylor Kramer MRN: [id number] DOB: 1931/11/30   Cancelled treatment:       Reason Eval/Treat Not Completed: Other (comment). Pt refused any PO with SLP despite encouragement. Informed pt I would not be able to return later, but she continued to refused. Reposted precautions and instructions to staff.    Knox Holdman, Katherene Ponto 08/21/2017, 8:41 AM

## 2017-08-21 NOTE — Social Work (Signed)
CSW confirmed SNF offer with [facility].  Pt will meet medicare qualifying night tomorrow.  Pt will follow for disposition.  Elissa Hefty, LCSW Clinical Social Worker [phone]

## 2017-08-21 NOTE — Social Work (Signed)
CSW met with son and patient at bedside and provided them the list of SNF offers.  Son indicated that he will review list and f/u with CSW this afternoon.  CSW will continue to follow.  Elissa Hefty, LCSW Clinical Social Worker [phone]

## 2017-08-22 ENCOUNTER — Encounter (HOSPITAL_COMMUNITY): Payer: Self-pay | Admitting: Radiology

## 2017-08-22 ENCOUNTER — Inpatient Hospital Stay (HOSPITAL_COMMUNITY): Payer: Medicare Other

## 2017-08-22 DIAGNOSIS — K5641 Fecal impaction: Secondary | ICD-10-CM

## 2017-08-22 DIAGNOSIS — W19XXXD Unspecified fall, subsequent encounter: Secondary | ICD-10-CM

## 2017-08-22 DIAGNOSIS — J9601 Acute respiratory failure with hypoxia: Secondary | ICD-10-CM

## 2017-08-22 DIAGNOSIS — J690 Pneumonitis due to inhalation of food and vomit: Secondary | ICD-10-CM

## 2017-08-22 LAB — PROCALCITONIN: Procalcitonin: 0.17 ng/mL

## 2017-08-22 LAB — BASIC METABOLIC PANEL
ANION GAP: 9 (ref 5–15)
BUN: 41 mg/dL — ABNORMAL HIGH (ref 6–20)
CALCIUM: 8.9 mg/dL (ref 8.9–10.3)
CO2: 28 mmol/L (ref 22–32)
CREATININE: 1.15 mg/dL — AB (ref 0.44–1.00)
Chloride: 103 mmol/L (ref 101–111)
GFR, EST AFRICAN AMERICAN: 50 mL/min — AB (ref >60)
GFR, EST NON AFRICAN AMERICAN: 43 mL/min — AB (ref >60)
Glucose, Bld: 101 mg/dL — ABNORMAL HIGH (ref 65–99)
Potassium: 4.7 mmol/L (ref 3.5–5.1)
SODIUM: 140 mmol/L (ref 135–145)

## 2017-08-22 LAB — CBC
HEMATOCRIT: 26.9 % — AB (ref 36.0–46.0)
HEMOGLOBIN: 8.4 g/dL — AB (ref 12.0–15.0)
MCH: 27.5 pg (ref 26.0–34.0)
MCHC: 31.2 g/dL (ref 30.0–36.0)
MCV: 88.2 fL (ref 78.0–100.0)
Platelets: 268 10*3/uL (ref 150–400)
RBC: 3.05 MIL/uL — ABNORMAL LOW (ref 3.87–5.11)
RDW: 16.8 % — ABNORMAL HIGH (ref 11.5–15.5)
WBC: 7.2 10*3/uL (ref 4.0–10.5)

## 2017-08-22 MED ORDER — ORAL CARE MOUTH RINSE
15.0000 mL | Freq: Two times a day (BID) | OROMUCOSAL | Status: DC
  Administered 2017-08-22 – 2017-08-23 (×2): 15 mL via OROMUCOSAL

## 2017-08-22 MED ORDER — LEVOFLOXACIN 250 MG PO TABS
250.0000 mg | ORAL_TABLET | Freq: Every day | ORAL | Status: DC
  Administered 2017-08-22 – 2017-08-23 (×2): 250 mg via ORAL
  Filled 2017-08-22 (×2): qty 1

## 2017-08-22 MED ORDER — DM-GUAIFENESIN ER 30-600 MG PO TB12
1.0000 | ORAL_TABLET | Freq: Two times a day (BID) | ORAL | Status: DC | PRN
  Administered 2017-08-23: 1 via ORAL
  Filled 2017-08-22 (×2): qty 1

## 2017-08-22 MED ORDER — LACTULOSE 10 GM/15ML PO SOLN
10.0000 g | ORAL | Status: AC
  Administered 2017-08-22: 10 g via ORAL
  Filled 2017-08-22: qty 15

## 2017-08-22 MED ORDER — IOPAMIDOL (ISOVUE-300) INJECTION 61%
INTRAVENOUS | Status: AC
  Administered 2017-08-22: 75 mL
  Filled 2017-08-22: qty 75

## 2017-08-22 MED ORDER — LEVOFLOXACIN 250 MG PO TABS: 375.0000 mg | ORAL_TABLET | Freq: Every day | ORAL | Status: DC

## 2017-08-22 NOTE — Discharge Summary (Addendum)
Addendum 1/26  Patient seen and examined today, plans were in place for d/c to SNF on 1/25 however patient complained of abdominal discomfort and underwent a CT scan which showed significant constipation / impacted stool. She received an enema and manual disimpaction today, improved significantly with several good BMs, she appears comfortable, able to eat, please continue aggressive bowel regimen at SNF.   Rest of plan per Dr. Lyman Speller d/c summary as below.  BP 118/68 (BP Location: Left Arm)   Pulse 97   Temp 98.1 F (36.7 C) (Oral)   Resp 16   Ht 5\' 7"  (1.702 m)   Wt 48.2 kg (106 lb 4.2 oz) Comment: without SCD   SpO2 92%   BMI 16.64 kg/m   Praise Dolecki M. Cruzita Lederer, MD [facility] [phone]  If 7PM-7AM, please contact night-coverage [email or web address] [facility]    Discharge Summary  Taylor Kramer ALP:[id number] DOB: [birth year]  PCP: Leeroy Cha, MD  Admit date: 08/19/2017 Discharge date: 08/22/2017  Time spent: 35 minutes   Recommendations for Outpatient Follow-up:  1. New medication: Levaquin 375 mg by mouth daily (starting 1/26) x 4 days 2. Oxygen 2 L nasal cannula-attempt weaning off after one week. 3. Please check daily pulse ox with vital signs check 4.  Speech therapy to follow-up with patient in 2-4 weeks to see diet can be upgraded  Discharge Diagnoses:  Active Hospital Problems   Diagnosis Date Noted  . Closed right hip fracture ([place]) 08/19/2017  . Dysphagia 08/21/2017  . Encounter for palliative care   . Goals of care, counseling/discussion   . Chronic diastolic CHF (congestive heart failure) ([place]) 08/20/2017  . Fall 08/19/2017  . Hypokalemia 08/19/2017  . Protein-calorie malnutrition, severe 08/19/2017  . Hypertension 11/21/2014  . History of CREST syndrome 11/21/2014    Resolved Hospital Problems  No resolved problems to display.    Discharge Condition: Improved, being discharged to skilled nursing   Diet recommendation:   Dysphagia 1 (pured solids) with honey thick liquids. All meds crushed with pure. Full assistance for feeding.  Vitals:   08/21/17 1900 08/22/17 0734  BP: (!) 107/57 (!) 113/53  Pulse: 99 (!) 103  Resp: 18 16  Temp: 98.2 F (36.8 C) 99.1 F (37.3 C)  SpO2: (!) 89% (!) 89%    History of present illness:  84 year old female with past medical history of hypertension and crest syndrome who resides in assisted living facility brought into the emergency room on the early morning of 1/22 after she sustained a fall & found to have an acute comminuted intertrochanteric fracture of the proximal right femur.   Hospital Course:  Principal Problem:   Closed right hip fracture [facility]): Seen by orthopedic surgery. Status post IM nail done later that day on 1/22. Monitoring for blood loss anemia.  Hemoglobin at 8.4 by day of discharge. Aspirin 325 by mouth daily prophylaxis. Patient will go to short-term skilled nursing prior to return back to assisted living. Active Problems:   Hypertension: Blood pressures remained stable   History of CREST syndrome: See below   Fall   Hypokalemia: Potassium replaced as needed   Protein-calorie malnutrition, severe/underweight: Patient meets criteria in the context of chronic illness. Her malnutrition is related to her crest syndrome. On vitamin supplementation   opioid induced constipation: Bowels have not moved since prior to admission. Patient on when necessary MiraLAX. Dose given with no results. Gave lactulose prior to discharge    Chronic diastolic CHF (congestive heart failure) [facility]): Reviewed previous echocardiogram  done April 2016 noting grade 1 diastolic dysfunction. BNP within normal limits.    Dysphagia: Given patient's chronic malnutrition from her crest syndrome, recommendation made for speech therapy evaluation. Patient unfortunate failed a modified barium swallow with complete aspiration risk. She is placed on a dysphagia 1 diet with honey thick  liquids, and even this still places her at risk for aspiration. Patient initially was resistant to thickening her liquids, however palliative care consulted for goals of care therapy. Patient understands is a chronic issue and is willing to at least try some restrictions to minimize aspiration risk.  Acute respiratory failure with hypoxia secondary to early aspiration pneumonia: Oxygen levels on room air so trending downward as of 1/24. Lung exam noted some scattered rails. By day of discharge, oxygen saturation about 88% on room air. White blood cell count normal and no fever. Pro calcitonin level checked noted minimal elevation of 0.17. Suspect early aspiration pneumonia, secondary to above. Have started by mouth Levaquin with first dose given prior to discharge. Have added several oxygen nasal cannula. This will be continued upon discharge. Chemistry reassess within 1 week to try to wean off. Patient remains a constant risk for aspiration pneumonia  Consultants:  Orthopedic surgery   Palliative care  Procedures:  Status post right IM nail done 1/22    Discharge Exam: BP (!) 113/53 (BP Location: Left Arm)   Pulse (!) 103   Temp 99.1 F (37.3 C) (Oral)   Resp 16   Ht 5\' 7"  (1.702 m)   Wt 48.2 kg (106 lb 4.2 oz)   SpO2 (!) 89%   BMI 16.64 kg/m   General: Alert and oriented 3, no acute distress  Cardiovascular: Regular rate and rhythm, S1-S2  Respiratory: Scattered rales   Discharge Instructions You were cared for by a hospitalist during your hospital stay. If you have any questions about your discharge medications or the care you received while you were in the hospital after you are discharged, you can call the unit and asked to speak with the hospitalist on call if the hospitalist that took care of you is not available. Once you are discharged, your primary care physician will handle any further medical issues. Please note that NO REFILLS for any discharge medications will be  authorized once you are discharged, as it is imperative that you return to your primary care physician (or establish a relationship with a primary care physician if you do not have one) for your aftercare needs so that they can reassess your need for medications and monitor your lab values.  Discharge Instructions    Full weight bearing   Complete by:  As directed      Allergies as of 08/22/2017   No Known Allergies     Medication List    TAKE these medications   acetaminophen 500 MG tablet Commonly known as:  TYLENOL Take 500 mg by mouth every 6 (six) hours as needed for moderate pain.   alendronate 70 MG tablet Commonly known as:  FOSAMAX Take 70 mg by mouth once a week. Take with a full glass of water on an empty stomach on Fridays.   amLODipine 2.5 MG tablet Commonly known as:  NORVASC Take 2.5 mg by mouth daily.   aspirin 325 MG EC tablet Take 1 tablet (325 mg total) by mouth daily with breakfast.   cholecalciferol 1000 units tablet Commonly known as:  VITAMIN D Take 1,000 Units by mouth daily.   Cranberry 450 MG Caps Take 1  capsule by mouth 2 (two) times daily.   fluticasone 50 MCG/ACT nasal spray Commonly known as:  FLONASE Place 2 sprays into both nostrils daily.   HYDROcodone-acetaminophen 5-325 MG tablet Commonly known as:  NORCO/VICODIN Take 1-2 tablets by mouth every 6 (six) hours as needed for moderate pain.   KONSYL 0.52 g capsule Generic drug:  psyllium Take 0.52 g by mouth daily.   levofloxacin 250 MG tablet Commonly known as:  LEVAQUIN Take 1.5 tablets (375 mg total) by mouth daily. Start taking on:  08/23/2017   loratadine 10 MG tablet Commonly known as:  CLARITIN Take 10 mg by mouth daily as needed for allergies.   polyethylene glycol packet Commonly known as:  MIRALAX / GLYCOLAX Take 17 g by mouth daily as needed for mild constipation.   senna-docusate 8.6-50 MG tablet Commonly known as:  Senokot-S Take 1 tablet by mouth 2 (two) times  daily. What changed:    when to take this  reasons to take this   SYSTANE BALANCE 0.6 % Soln Generic drug:  Propylene Glycol Apply 1 drop to eye 4 (four) times daily.   vitamin C 500 MG tablet Commonly known as:  ASCORBIC ACID Take 500 mg by mouth daily.   ZINC-220 220 (50 Zn) MG capsule Generic drug:  zinc sulfate Take 220 mg by mouth daily.   zolpidem 10 MG tablet Commonly known as:  AMBIEN Take 10 mg by mouth at bedtime.            Discharge Care Instructions  (From admission, onward)        Start     Ordered   08/21/17 0000  Full weight bearing     08/21/17 1233       No Known Allergies  Contact information for follow-up providers    Mcarthur Rossetti, MD. Schedule an appointment as soon as possible for a visit in 2 week(s).   Specialty:  Orthopedic Surgery Contact information: [place] [address] [phone]            Contact information for after-discharge care    Destination    [facility] .   Service:  Skilled Nursing Contact information: [place] [phone]                   The results of significant diagnostics from this hospitalization (including imaging, microbiology, ancillary and laboratory) are listed below for reference.    Significant Diagnostic Studies: Dg Chest 1 View  Result Date: 08/19/2017 CLINICAL DATA:  Cough.  Fall with fracture of hip. EXAM: CHEST 1 VIEW COMPARISON:  03/19/2017 FINDINGS: Patient rotation limits the evaluation. Shallow inspiration. Heart size and pulmonary vascularity are normal. Probable emphysematous and chronic bronchitic changes in the lungs. No focal consolidation or airspace disease. No pleural effusions. No pneumothorax. Gas-filled stomach may indicate gastroparesis or ileus. IMPRESSION: Shallow inspiration. Emphysematous and chronic bronchitic changes in the lungs. No evidence of active  pulmonary disease. Gas distended stomach may indicate ileus or gastroparesis. Electronically Signed   By: Lucienne Capers M.D.   On: 08/19/2017 02:43   Dg Chest Kramer 1 View  Result Date: 08/21/2017 CLINICAL DATA:  Aspiration pneumonia. EXAM: PORTABLE CHEST 1 VIEW COMPARISON:  08/19/2016.  03/19/2017 FINDINGS: Patient rotated to the right. Tortuous thoracic aorta is again noted. Aneurysmal dilatation cannot be excluded. No change noted from multiple prior exams. Right base atelectasis/infiltrate noted. Distended loops of bowel are noted. Contrast  noted in the bowel. Free intraperitoneal air cannot be excluded. Abdominal series suggested for further evaluation. Surgical clips right upper quadrant. IMPRESSION: 1. Distended loops of bowel are noted. Free intraperitoneal air cannot be excluded. Abdominal series suggested for further evaluation. 2.  Right lower lobe atelectasis and infiltrate. 3. Stable tortuous thoracic aorta. Thoracic aortic aneurysm cannot be excluded. Heart size stable. Critical Value/emergent results were called by telephone at the time of interpretation on 08/21/2017 at 3:58 pm to Nurse Lattie Haw who verbally acknowledged these results. Electronically Signed   By: Marcello Moores  Register   On: 08/21/2017 16:00   Dg Swallowing Func-speech Pathology  Result Date: 08/20/2017 Objective Swallowing Evaluation: Type of Study: MBS-Modified Barium Swallow Study  Patient Details Name: Rochell Puett MRN: [id number] Date of Birth: 06-22-1932 Today's Date: 08/20/2017 Time: SLP Start Time (ACUTE ONLY): 1310 -SLP Stop Time (ACUTE ONLY): 1345 SLP Time Calculation (min) (ACUTE ONLY): 35 min Past Medical History: Past Medical History: Diagnosis Date . CREST syndrome ([place])  . Hypertension  . Insomnia  . Neuropathy  . Osteoporosis  Past Surgical History: Past Surgical History: Procedure Laterality Date . ABDOMINAL HYSTERECTOMY   . CHOLECYSTECTOMY   . INTRAMEDULLARY (IM) NAIL INTERTROCHANTERIC Right 08/19/2017  Procedure:  INTRAMEDULLARY (IM) NAIL INTERTROCHANTRIC;  Surgeon: Mcarthur Rossetti, MD;  Location: [address];  Service: Orthopedics;  Laterality: Right; . renal stones   HPI: 84 year old female with past medical history of hypertension and crest syndrome who resides in assisted living facility brought into the emergency room on the early morning of 1/22 after she sustained a fall landing on her right side. Is unable to stand. Patient normally ambulates with a wheelchair although for extremely short distances such as from her bed to bathroom, she uses a walker. Underwent surgery on 1/22 in the late pm.  No Data Recorded Assessment / Plan / Recommendation CHL IP CLINICAL IMPRESSIONS 08/20/2017 Clinical Impression Pt presents with a severe oropharyngeal dysphagia. There is timely and adequate laryngeal elevation, but delayed bolus propulsion and UES opening mechanism leading to pooled liquid in the pyriforms during propulsive action, which pushes aspirate into the the larynx and trachea. Aspiration quantity is severe with minimal sensation, regardless of texture. There are also severe vallecular and pyriform residuals post swallow, due to limited base of tongue propulsion, epiglottic deflection and pharyngeal contraction. Esophageal sweep also reveals signs of esophageal dysphagia. Suspect impact on connective tissue associated with CREST syndrome. There is minimal benefit of a chin tuck, multiple effortful swallows, cued cough/throat clear and limitation of bolus size with honey thick liquids. Likely pts impairment is chronic, but potentially acutely worsened by hip fracture. CXR upon admission was clear, but pt has severe risk of respiratory infection given dysphagia, deconditioning and malnourishment. Discussed with MD who does not want to prolong NPO status and would like to trial a restricted diet with intensive interventions for potential modest improvement over hospital admission. Will initiate a puree and honey thick  teaspoon diet for now. Discussed with son, who is agreeable to plan, but voices concern about his mothers potential compliance. Palliative care consult would be beneficial given complexity of impairments.   SLP Visit Diagnosis Dysphagia, oropharyngeal phase (R13.12) Attention and concentration deficit following -- Frontal lobe and executive function deficit following -- Impact on safety and function Severe aspiration risk   CHL IP TREATMENT RECOMMENDATION 08/20/2017 Treatment Recommendations Therapy as outlined in treatment plan below   Prognosis 08/20/2017 Prognosis for Safe Diet Advancement Guarded Barriers to Reach Goals -- Barriers/Prognosis Comment -- CHL  IP DIET RECOMMENDATION 08/20/2017 SLP Diet Recommendations Dysphagia 1 (Puree) solids;Honey thick liquids Liquid Administration via Spoon Medication Administration Crushed with puree Compensations Slow rate;Small sips/bites;Multiple dry swallows after each bite/sip;Clear throat intermittently;Effortful swallow Postural Changes Remain semi-upright after after feeds/meals (Comment);Seated upright at 90 degrees   CHL IP OTHER RECOMMENDATIONS 08/20/2017 Recommended Consults -- Oral Care Recommendations -- Other Recommendations Have oral suction available;Order thickener from pharmacy   CHL IP FOLLOW UP RECOMMENDATIONS 08/20/2017 Follow up Recommendations Skilled Nursing facility   [facility] IP FREQUENCY AND DURATION 08/20/2017 Speech Therapy Frequency (ACUTE ONLY) min 3x week Treatment Duration 3 weeks      CHL IP ORAL PHASE 08/20/2017 Oral Phase WFL Oral - Pudding Teaspoon -- Oral - Pudding Cup -- Oral - Honey Teaspoon -- Oral - Honey Cup -- Oral - Nectar Teaspoon -- Oral - Nectar Cup -- Oral - Nectar Straw -- Oral - Thin Teaspoon -- Oral - Thin Cup -- Oral - Thin Straw -- Oral - Puree -- Oral - Mech Soft -- Oral - Regular -- Oral - Multi-Consistency -- Oral - Pill -- Oral Phase - Comment --  CHL IP PHARYNGEAL PHASE 08/20/2017 Pharyngeal Phase Impaired Pharyngeal- Pudding  Teaspoon -- Pharyngeal -- Pharyngeal- Pudding Cup -- Pharyngeal -- Pharyngeal- Honey Teaspoon Delayed swallow initiation-pyriform sinuses;Reduced pharyngeal peristalsis;Reduced epiglottic inversion;Reduced anterior laryngeal mobility;Reduced airway/laryngeal closure;Reduced tongue base retraction;Penetration/Aspiration before swallow;Penetration/Aspiration during swallow;Penetration/Apiration after swallow;Significant aspiration (Amount);Pharyngeal residue - valleculae;Pharyngeal residue - pyriform Pharyngeal Material enters airway, passes BELOW cords and not ejected out despite cough attempt by patient;Material enters airway, passes BELOW cords without attempt by patient to eject out (silent aspiration) Pharyngeal- Honey Cup Delayed swallow initiation-pyriform sinuses;Reduced pharyngeal peristalsis;Reduced epiglottic inversion;Reduced anterior laryngeal mobility;Reduced airway/laryngeal closure;Reduced tongue base retraction;Penetration/Aspiration before swallow;Penetration/Aspiration during swallow;Penetration/Apiration after swallow;Significant aspiration (Amount);Pharyngeal residue - valleculae;Pharyngeal residue - pyriform Pharyngeal Material enters airway, passes BELOW cords and not ejected out despite cough attempt by patient;Material enters airway, passes BELOW cords without attempt by patient to eject out (silent aspiration) Pharyngeal- Nectar Teaspoon Delayed swallow initiation-pyriform sinuses;Reduced pharyngeal peristalsis;Reduced epiglottic inversion;Reduced anterior laryngeal mobility;Reduced airway/laryngeal closure;Reduced tongue base retraction;Penetration/Aspiration before swallow;Penetration/Aspiration during swallow;Penetration/Apiration after swallow;Significant aspiration (Amount);Pharyngeal residue - valleculae;Pharyngeal residue - pyriform Pharyngeal Material enters airway, passes BELOW cords and not ejected out despite cough attempt by patient;Material enters airway, passes BELOW cords  without attempt by patient to eject out (silent aspiration) Pharyngeal- Nectar Cup Delayed swallow initiation-pyriform sinuses;Reduced pharyngeal peristalsis;Reduced epiglottic inversion;Reduced anterior laryngeal mobility;Reduced airway/laryngeal closure;Reduced tongue base retraction;Penetration/Aspiration before swallow;Penetration/Aspiration during swallow;Penetration/Apiration after swallow;Significant aspiration (Amount);Pharyngeal residue - valleculae;Pharyngeal residue - pyriform Pharyngeal Material enters airway, passes BELOW cords and not ejected out despite cough attempt by patient;Material enters airway, passes BELOW cords without attempt by patient to eject out (silent aspiration) Pharyngeal- Nectar Straw Delayed swallow initiation-pyriform sinuses;Reduced pharyngeal peristalsis;Reduced epiglottic inversion;Reduced anterior laryngeal mobility;Reduced airway/laryngeal closure;Reduced tongue base retraction;Penetration/Aspiration before swallow;Penetration/Aspiration during swallow;Penetration/Apiration after swallow;Significant aspiration (Amount);Pharyngeal residue - valleculae;Pharyngeal residue - pyriform Pharyngeal Material enters airway, passes BELOW cords and not ejected out despite cough attempt by patient;Material enters airway, passes BELOW cords without attempt by patient to eject out (silent aspiration) Pharyngeal- Thin Teaspoon -- Pharyngeal -- Pharyngeal- Thin Cup Delayed swallow initiation-pyriform sinuses;Reduced pharyngeal peristalsis;Reduced epiglottic inversion;Reduced anterior laryngeal mobility;Reduced airway/laryngeal closure;Reduced tongue base retraction;Penetration/Aspiration before swallow;Penetration/Aspiration during swallow;Penetration/Apiration after swallow;Significant aspiration (Amount);Pharyngeal residue - valleculae;Pharyngeal residue - pyriform Pharyngeal -- Pharyngeal- Thin Straw -- Pharyngeal -- Pharyngeal- Puree Delayed swallow initiation-pyriform sinuses;Reduced  pharyngeal peristalsis;Reduced epiglottic inversion;Reduced anterior laryngeal mobility;Reduced airway/laryngeal closure;Reduced tongue base retraction;Penetration/Aspiration before swallow;Penetration/Aspiration during swallow;Penetration/Apiration  after swallow;Significant aspiration (Amount);Pharyngeal residue - valleculae;Pharyngeal residue - pyriform Pharyngeal Material does not enter airway Pharyngeal- Mechanical Soft Delayed swallow initiation-pyriform sinuses;Reduced pharyngeal peristalsis;Reduced epiglottic inversion;Reduced anterior laryngeal mobility;Reduced airway/laryngeal closure;Reduced tongue base retraction;Penetration/Aspiration before swallow;Penetration/Aspiration during swallow;Penetration/Apiration after swallow;Significant aspiration (Amount);Pharyngeal residue - valleculae;Pharyngeal residue - pyriform Pharyngeal Material does not enter airway Pharyngeal- Regular -- Pharyngeal -- Pharyngeal- Multi-consistency -- Pharyngeal -- Pharyngeal- Pill -- Pharyngeal -- Pharyngeal Comment --  CHL IP CERVICAL ESOPHAGEAL PHASE 08/20/2017 Cervical Esophageal Phase Impaired Pudding Teaspoon -- Pudding Cup -- Honey Teaspoon -- Honey Cup -- Nectar Teaspoon -- Nectar Cup -- Nectar Straw -- Thin Teaspoon -- Thin Cup -- Thin Straw -- Puree -- Mechanical Soft -- Regular -- Multi-consistency -- Pill -- Cervical Esophageal Comment -- No flowsheet data found. Herbie Baltimore, MA CCC-SLP [phone] Lynann Beaver 08/20/2017, 2:17 PM              Dg C-arm 1-60 Min  Result Date: 08/19/2017 CLINICAL DATA:  Status post ORIF of right hip fracture. EXAM: RIGHT FEMUR 2 VIEWS; DG C-ARM 61-120 MIN COMPARISON:  08/19/2017 FINDINGS: Four images from portable C-arm radiography obtained in the operating room show intramedullary nail and hip screw reduction and internal fixation of acute intertrochanteric fracture involving the proximal right femur. IMPRESSION: Status post ORIF of proximal right femur fracture.  Electronically Signed   By: Kerby Moors M.D.   On: 08/19/2017 20:50   Dg Hip Unilat  With Pelvis 2-3 Views Right  Result Date: 08/19/2017 CLINICAL DATA:  Fall with right hip pain EXAM: DG HIP (WITH OR WITHOUT PELVIS) 2-3V RIGHT COMPARISON:  CT abdomen pelvis 03/17/2017 FINDINGS: Comminuted intertrochanteric fracture of the proximal right femur. No femoral head dislocation. Pubic symphysis is intact. Massive feces in the pelvis. IMPRESSION: Of acute comminuted intertrochanteric fracture of the proximal right femur. Massive feces in the pelvis. Electronically Signed   By: Donavan Foil M.D.   On: 08/19/2017 02:43   Dg Femur, Min 2 Views Right  Result Date: 08/19/2017 CLINICAL DATA:  Status post ORIF of right hip fracture. EXAM: RIGHT FEMUR 2 VIEWS; DG C-ARM 61-120 MIN COMPARISON:  08/19/2017 FINDINGS: Four images from portable C-arm radiography obtained in the operating room show intramedullary nail and hip screw reduction and internal fixation of acute intertrochanteric fracture involving the proximal right femur. IMPRESSION: Status post ORIF of proximal right femur fracture. Electronically Signed   By: Kerby Moors M.D.   On: 08/19/2017 20:50    Microbiology: No results found for this or any previous visit (from the past 240 hour(s)).   Labs: Basic Metabolic Panel: Recent Labs  Lab 08/19/17 0239 08/19/17 0408 08/20/17 0934 08/21/17 0755 08/22/17 0432  NA 140 141 140 138 140  K 3.4* 3.5 4.4 4.7 4.7  CL 104 104 102 100* 103  CO2 26 26 25 29 28   GLUCOSE 111* 102* 134* 104* 101*  BUN 21* 20 21* 32* 41*  CREATININE 0.84 0.77 1.10* 1.30* 1.15*  CALCIUM 8.5* 8.5* 8.0* 8.7* 8.9   Liver Function Tests: No results for input(s): AST, ALT, ALKPHOS, BILITOT, PROT, ALBUMIN in the last 168 hours. No results for input(s): LIPASE, AMYLASE in the last 168 hours. No results for input(s): AMMONIA in the last 168 hours. CBC: Recent Labs  Lab 08/19/17 0239 08/19/17 0408 08/20/17 0934  08/21/17 0755 08/22/17 0432  WBC 9.5 11.1* 10.5 12.3* 7.2  NEUTROABS 8.0*  --   --   --   --   HGB 11.8* 11.5* 9.5* 9.3* 8.4*  HCT 37.5 37.1 29.6* 30.0* 26.9*  MCV 87.6 87.7 87.6 88.5 88.2  PLT 256 276 298 323 268   Cardiac Enzymes: No results for input(s): CKTOTAL, CKMB, CKMBINDEX, TROPONINI in the last 168 hours. BNP: BNP (last 3 results) Recent Labs    08/20/17 1342  BNP 84.0    ProBNP (last 3 results) No results for input(s): PROBNP in the last 8760 hours.  CBG: No results for input(s): GLUCAP in the last 168 hours.     Signed:  Annita Brod, MD [facility] 08/22/2017, 11:08 AM

## 2017-08-22 NOTE — Social Work (Signed)
Clinical Social Worker facilitated patient discharge including contacting patient family and facility to confirm patient discharge plans.  Clinical information faxed to facility and family agreeable with plan.    CSW arranged ambulance transport via [facility] to [facility].    RN to call [phone] to give report prior to discharge. Pt going to Room 206.  Clinical Social Worker will sign off for now as social work intervention is no longer needed. Please consult [place] again if new need arises.  Taylor Hefty, LCSW Clinical Social Worker [phone]

## 2017-08-22 NOTE — Progress Notes (Signed)
PMT addendum:  Call placed and reviewed abdomen series X rays with Dr Randel Pigg from Radiology:  Patient is noted to have marked fecal impaction, as well as possible free air versus super imposed bowel and a CT abdomen is recommended for further evaluation.   Full report is noted below:  1. Marked fecal impaction in the rectosigmoid extending up to the L1 level. 2. Colonic interposition is noted with bowel loops projecting over the liver shadow. Crescentic air like lucencies seen on the upright view of the chest which cannot entirely exclude the presence of a small amount of free air versus superimposed bowel. CT of the abdomen is recommended for more certainty. These results were called by telephone at the time of interpretation on 08/22/2017 at 1:55 pm to Dr. Loistine Chance , who verbally acknowledged these results. 3. Small amount of mottled soft tissue emphysema projects adjacent to the right hemipelvis and hip possibly associated with a decubitus ulcer. Soft tissue infection or instrumentation might also account for this and should be correlated.  Call placed and discussed with TRH MD Dr. Maryland Pink, bedside RN, CSW, call also placed to patient's son Marya Amsler, unable to reach him  NPO  CT abdomen has been ordered.  Cancel discharge.  35 minutes spent coordinating care Loistine Chance MD [facility] health palliative medicine team [phone]

## 2017-08-22 NOTE — Progress Notes (Signed)
PT Cancellation Note  Patient Details Name: Carinna Newhart MRN: [id number] DOB: Dec 21, 1931   Cancelled Treatment:    Reason Eval/Treat Not Completed: Patient at procedure or test/unavailable.  Pt is away at a test.  PT to check back later today as time allows, if now Monday if still here.  Thanks,    Barbarann Ehlers. [place], [place], DPT [phone]   08/22/2017, 4:08 PM

## 2017-08-22 NOTE — Care Management Important Message (Signed)
Important Message  Patient Details  Name: Taylor Kramer MRN: [id number] Date of Birth: 1931-09-22   Medicare Important Message Given:  Yes    Barb Merino Diani [place] 08/22/2017, 2:37 PM

## 2017-08-22 NOTE — Clinical Social Work Placement (Signed)
   CLINICAL SOCIAL WORK PLACEMENT  NOTE  Date:  08/22/2017  Patient Details  Name: Taylor Kramer MRN: [id number] Date of Birth: 06-11-1932  Clinical Social Work is seeking post-discharge placement for this patient at the [place] level of care (*CSW will initial, date and re-position this form in  chart as items are completed):  Yes   Patient/family provided with [place] Work Department's list of facilities offering this level of care within the geographic area requested by the patient (or if unable, by the patient's family).  Yes   Patient/family informed of their freedom to choose among providers that offer the needed level of care, that participate in Medicare, Medicaid or managed care program needed by the patient, have an available bed and are willing to accept the patient.  Yes   Patient/family informed of [facility]'s ownership interest in [facility] and [facility], as well as of the fact that they are under no obligation to receive care at these facilities.  PASRR submitted to EDS on       PASRR number received on 08/20/17     Existing PASRR number confirmed on       FL2 transmitted to all facilities in geographic area requested by pt/family on 08/20/17     FL2 transmitted to all facilities within larger geographic area on       Patient informed that his/her managed care company has contracts with or will negotiate with certain facilities, including the following:        Yes   Patient/family informed of bed offers received.  Patient chooses bed at [place] [place]     Physician recommends and patient chooses bed at      Patient to be transferred to [facility] on 08/22/17.  Patient to be transferred to facility by PTAR     Patient family notified on 08/22/17 of transfer.  Name of family member notified:  son advised     PHYSICIAN       Additional Comment:     _______________________________________________ Normajean Baxter, LCSW 08/22/2017, 11:52 AM

## 2017-08-22 NOTE — Social Work (Signed)
CSW canceled transport to [facility] as patient not medically ready.  CSW advised SNF of same.   CSW will follow for disposition.  Elissa Hefty, LCSW Clinical Social Worker [phone]

## 2017-08-22 NOTE — Progress Notes (Signed)
PMT progress note  Ms Taylor Kramer is awake alert resting in bed, no family at bedside, she states she feels tired, but over all is feeling better. She denies abdominal pain, how ever, is lying on one side, has some mild tenderness in her L flank area. She denies shortness of breath, denies chest pain.   BP (!) 113/53 (BP Location: Left Arm)   Pulse (!) 103   Temp 99.1 F (37.3 C) (Oral)   Resp 16   Ht 5\' 7"  (1.702 m)   Wt 48.2 kg (106 lb 4.2 oz)   SpO2 (!) 89%   BMI 16.64 kg/m  Labs and imaging noted Medication history noted  Awake alert In no distress S1 S2 Diminished at bases Abdomen soft non tender, some L flank mild discomfort noted on palpation No edema Thin lady, poor dentition  A/P: Closed R hip fracture, s/p IM nailing, patient to go to SNF for rehab. Agree with current pain regimen, TRH MD and ortho notes reviewed.  Possible early asp pna, patient now on Abx. Constipation: agree with current bowel regimen.  CXR results noted, patient to have follow up abd series prior to discharge today.  Goals of care: not comfort only, patient and son wish for ongoing stabilization/recovery, PT efforts, patient to continue safe diet as per SLP recommendations on discharge and understands aspiration risk.  History of CREST syndrome.   25 minutes spent.  Loistine Chance MD [facility] health palliative medicine team [phone]

## 2017-08-22 NOTE — Progress Notes (Signed)
SATURATION QUALIFICATIONS: (This note is used to comply with regulatory documentation for home oxygen)  Patient Saturations on Room Air at Rest =86%   

## 2017-08-22 NOTE — Progress Notes (Signed)
SLP Cancellation Note  Patient Details Name: Taylor Kramer MRN: [id number] DOB: 07-04-1932   Cancelled treatment:       Reason Eval/Treat Not Completed: Medical issues which prohibited therapy. Pt NPO for CT abdomen.     Deetya Drouillard, Katherene Ponto 08/22/2017, 3:23 PM

## 2017-08-23 DIAGNOSIS — Z90710 Acquired absence of both cervix and uterus: Secondary | ICD-10-CM | POA: Diagnosis not present

## 2017-08-23 DIAGNOSIS — M810 Age-related osteoporosis without current pathological fracture: Secondary | ICD-10-CM | POA: Diagnosis present

## 2017-08-23 DIAGNOSIS — M6389 Disorders of muscle in diseases classified elsewhere, multiple sites: Secondary | ICD-10-CM | POA: Diagnosis not present

## 2017-08-23 DIAGNOSIS — L89152 Pressure ulcer of sacral region, stage 2: Secondary | ICD-10-CM | POA: Diagnosis not present

## 2017-08-23 DIAGNOSIS — E860 Dehydration: Secondary | ICD-10-CM | POA: Diagnosis not present

## 2017-08-23 DIAGNOSIS — G8911 Acute pain due to trauma: Secondary | ICD-10-CM | POA: Diagnosis not present

## 2017-08-23 DIAGNOSIS — R1312 Dysphagia, oropharyngeal phase: Secondary | ICD-10-CM | POA: Diagnosis not present

## 2017-08-23 DIAGNOSIS — N179 Acute kidney failure, unspecified: Secondary | ICD-10-CM | POA: Diagnosis not present

## 2017-08-23 DIAGNOSIS — S72001D Fracture of unspecified part of neck of right femur, subsequent encounter for closed fracture with routine healing: Secondary | ICD-10-CM | POA: Diagnosis not present

## 2017-08-23 DIAGNOSIS — R41841 Cognitive communication deficit: Secondary | ICD-10-CM | POA: Diagnosis not present

## 2017-08-23 DIAGNOSIS — R031 Nonspecific low blood-pressure reading: Secondary | ICD-10-CM | POA: Diagnosis not present

## 2017-08-23 DIAGNOSIS — R5381 Other malaise: Secondary | ICD-10-CM | POA: Diagnosis not present

## 2017-08-23 DIAGNOSIS — R2681 Unsteadiness on feet: Secondary | ICD-10-CM | POA: Diagnosis not present

## 2017-08-23 DIAGNOSIS — E43 Unspecified severe protein-calorie malnutrition: Secondary | ICD-10-CM | POA: Diagnosis not present

## 2017-08-23 DIAGNOSIS — I110 Hypertensive heart disease with heart failure: Secondary | ICD-10-CM | POA: Diagnosis present

## 2017-08-23 DIAGNOSIS — I10 Essential (primary) hypertension: Secondary | ICD-10-CM | POA: Diagnosis not present

## 2017-08-23 DIAGNOSIS — J9811 Atelectasis: Secondary | ICD-10-CM | POA: Diagnosis not present

## 2017-08-23 DIAGNOSIS — I959 Hypotension, unspecified: Secondary | ICD-10-CM | POA: Diagnosis present

## 2017-08-23 DIAGNOSIS — Z87891 Personal history of nicotine dependence: Secondary | ICD-10-CM | POA: Diagnosis not present

## 2017-08-23 DIAGNOSIS — Z66 Do not resuscitate: Secondary | ICD-10-CM | POA: Diagnosis present

## 2017-08-23 DIAGNOSIS — Z8673 Personal history of transient ischemic attack (TIA), and cerebral infarction without residual deficits: Secondary | ICD-10-CM | POA: Diagnosis not present

## 2017-08-23 DIAGNOSIS — M341 CR(E)ST syndrome: Secondary | ICD-10-CM | POA: Diagnosis present

## 2017-08-23 DIAGNOSIS — R1314 Dysphagia, pharyngoesophageal phase: Secondary | ICD-10-CM | POA: Diagnosis not present

## 2017-08-23 DIAGNOSIS — Z681 Body mass index (BMI) 19 or less, adult: Secondary | ICD-10-CM | POA: Diagnosis not present

## 2017-08-23 DIAGNOSIS — R5383 Other fatigue: Secondary | ICD-10-CM | POA: Diagnosis not present

## 2017-08-23 DIAGNOSIS — I5032 Chronic diastolic (congestive) heart failure: Secondary | ICD-10-CM | POA: Diagnosis present

## 2017-08-23 DIAGNOSIS — K5909 Other constipation: Secondary | ICD-10-CM | POA: Diagnosis not present

## 2017-08-23 DIAGNOSIS — M6281 Muscle weakness (generalized): Secondary | ICD-10-CM | POA: Diagnosis not present

## 2017-08-23 DIAGNOSIS — R627 Adult failure to thrive: Secondary | ICD-10-CM | POA: Diagnosis present

## 2017-08-23 DIAGNOSIS — Z7982 Long term (current) use of aspirin: Secondary | ICD-10-CM | POA: Diagnosis not present

## 2017-08-23 DIAGNOSIS — S79911A Unspecified injury of right hip, initial encounter: Secondary | ICD-10-CM | POA: Diagnosis not present

## 2017-08-23 LAB — CBC
HCT: 26.8 % — ABNORMAL LOW (ref 36.0–46.0)
Hemoglobin: 8.4 g/dL — ABNORMAL LOW (ref 12.0–15.0)
MCH: 27.8 pg (ref 26.0–34.0)
MCHC: 31.3 g/dL (ref 30.0–36.0)
MCV: 88.7 fL (ref 78.0–100.0)
PLATELETS: 299 10*3/uL (ref 150–400)
RBC: 3.02 MIL/uL — ABNORMAL LOW (ref 3.87–5.11)
RDW: 16.8 % — ABNORMAL HIGH (ref 11.5–15.5)
WBC: 9 10*3/uL (ref 4.0–10.5)

## 2017-08-23 MED ORDER — MILK AND MOLASSES ENEMA
1.0000 | Freq: Once | RECTAL | Status: AC
  Administered 2017-08-23: 250 mL via RECTAL
  Filled 2017-08-23: qty 250

## 2017-08-23 NOTE — Clinical Social Work Note (Signed)
Clinical Social Worker facilitated patient discharge including contacting patient family and facility to confirm patient discharge plans.  Clinical information faxed to facility and family agreeable with plan.  CSW arranged ambulance transport via [facility] to [facility] Room 206.  RN to call report prior to discharge [phone]).  Clinical Social Worker will sign off for now as social work intervention is no longer needed. Please consult [place] again if new need arises.  Barbette Or, [place]

## 2017-08-23 NOTE — Progress Notes (Signed)
Patient ID: Taylor Kramer, female   DOB: Mar 31, 1932, 84 y.o.   MRN: [id number] No acute changes from her right hip standpoint.  Apparently being treated for pneumonia.  Her right hip is stable.  Only scant drainage on dressings.  Can continue attempt to mobilize with therapy - WBAT right hip, but only up with assistance due to significant fall risk.

## 2017-08-23 NOTE — Clinical Social Work Placement (Signed)
   CLINICAL SOCIAL WORK PLACEMENT  NOTE  Date:  08/23/2017  Patient Details  Name: Taylor Kramer MRN: [id number] Date of Birth: 1932-05-09  Clinical Social Work is seeking post-discharge placement for this patient at the [place] level of care (*CSW will initial, date and re-position this form in  chart as items are completed):  Yes   Patient/family provided with [place] Work Department's list of facilities offering this level of care within the geographic area requested by the patient (or if unable, by the patient's family).  Yes   Patient/family informed of their freedom to choose among providers that offer the needed level of care, that participate in Medicare, Medicaid or managed care program needed by the patient, have an available bed and are willing to accept the patient.  Yes   Patient/family informed of [facility]'s ownership interest in [facility] and [facility], as well as of the fact that they are under no obligation to receive care at these facilities.  PASRR submitted to EDS on       PASRR number received on 08/20/17     Existing PASRR number confirmed on       FL2 transmitted to all facilities in geographic area requested by pt/family on 08/20/17     FL2 transmitted to all facilities within larger geographic area on       Patient informed that his/her managed care company has contracts with or will negotiate with certain facilities, including the following:        Yes   Patient/family informed of bed offers received.  Patient chooses bed at [facility] [place]     Physician recommends and patient chooses bed at      Patient to be transferred to [place] on 08/23/17.  Patient to be transferred to facility by PTAR     Patient family notified on 08/23/17 of transfer.  Name of family member notified:  son advised     PHYSICIAN Please prepare priority discharge summary, including medications      Additional Comment:   Barbette Or, [place]

## 2017-08-23 NOTE — Progress Notes (Signed)
Removed IV, all concerns addressed, called facility and gave report to nurse [facility], Pt not in distress, discharged via PTAR with belongings.

## 2017-08-23 NOTE — Plan of Care (Signed)
  Nutrition: Adequate nutrition will be maintained 08/23/2017 1229 - Progressing by Williams Che, RN   Elimination: Will not experience complications related to bowel motility 08/23/2017 1229 - Progressing by Williams Che, RN   Pain Managment: General experience of comfort will improve 08/23/2017 1229 - Progressing by Williams Che, RN   Safety: Ability to remain free from injury will improve 08/23/2017 1229 - Progressing by Williams Che, RN   Skin Integrity: Risk for impaired skin integrity will decrease 08/23/2017 1229 - Progressing by Williams Che, RN

## 2017-08-26 ENCOUNTER — Emergency Department (HOSPITAL_COMMUNITY): Payer: Medicare Other

## 2017-08-26 ENCOUNTER — Encounter (HOSPITAL_COMMUNITY): Payer: Self-pay

## 2017-08-26 ENCOUNTER — Inpatient Hospital Stay (HOSPITAL_COMMUNITY)
Admission: EM | Admit: 2017-08-26 | Discharge: 2017-08-28 | DRG: 682 | Disposition: A | Discharge status: Skilled Nursing Facility | Payer: Medicare Other | Attending: Internal Medicine | Admitting: Internal Medicine

## 2017-08-26 DIAGNOSIS — Z8673 Personal history of transient ischemic attack (TIA), and cerebral infarction without residual deficits: Secondary | ICD-10-CM

## 2017-08-26 DIAGNOSIS — K5909 Other constipation: Secondary | ICD-10-CM

## 2017-08-26 DIAGNOSIS — Z7982 Long term (current) use of aspirin: Secondary | ICD-10-CM

## 2017-08-26 DIAGNOSIS — I110 Hypertensive heart disease with heart failure: Secondary | ICD-10-CM | POA: Diagnosis present

## 2017-08-26 DIAGNOSIS — M810 Age-related osteoporosis without current pathological fracture: Secondary | ICD-10-CM | POA: Diagnosis present

## 2017-08-26 DIAGNOSIS — I5032 Chronic diastolic (congestive) heart failure: Secondary | ICD-10-CM | POA: Diagnosis not present

## 2017-08-26 DIAGNOSIS — R6251 Failure to thrive (child): Secondary | ICD-10-CM

## 2017-08-26 DIAGNOSIS — Z87891 Personal history of nicotine dependence: Secondary | ICD-10-CM | POA: Diagnosis not present

## 2017-08-26 DIAGNOSIS — E860 Dehydration: Secondary | ICD-10-CM

## 2017-08-26 DIAGNOSIS — R627 Adult failure to thrive: Secondary | ICD-10-CM | POA: Diagnosis not present

## 2017-08-26 DIAGNOSIS — K5900 Constipation, unspecified: Secondary | ICD-10-CM

## 2017-08-26 DIAGNOSIS — I10 Essential (primary) hypertension: Secondary | ICD-10-CM | POA: Diagnosis present

## 2017-08-26 DIAGNOSIS — N179 Acute kidney failure, unspecified: Principal | ICD-10-CM | POA: Diagnosis present

## 2017-08-26 DIAGNOSIS — Z90710 Acquired absence of both cervix and uterus: Secondary | ICD-10-CM

## 2017-08-26 DIAGNOSIS — Z66 Do not resuscitate: Secondary | ICD-10-CM | POA: Diagnosis present

## 2017-08-26 DIAGNOSIS — Z8739 Personal history of other diseases of the musculoskeletal system and connective tissue: Secondary | ICD-10-CM

## 2017-08-26 DIAGNOSIS — I959 Hypotension, unspecified: Secondary | ICD-10-CM | POA: Diagnosis present

## 2017-08-26 DIAGNOSIS — J9811 Atelectasis: Secondary | ICD-10-CM | POA: Diagnosis not present

## 2017-08-26 DIAGNOSIS — Z681 Body mass index (BMI) 19 or less, adult: Secondary | ICD-10-CM | POA: Diagnosis not present

## 2017-08-26 DIAGNOSIS — E43 Unspecified severe protein-calorie malnutrition: Secondary | ICD-10-CM

## 2017-08-26 DIAGNOSIS — L89152 Pressure ulcer of sacral region, stage 2: Secondary | ICD-10-CM | POA: Diagnosis not present

## 2017-08-26 DIAGNOSIS — M341 CR(E)ST syndrome: Secondary | ICD-10-CM | POA: Diagnosis present

## 2017-08-26 LAB — CREATININE, SERUM
CREATININE: 1.64 mg/dL — AB (ref 0.44–1.00)
GFR calc Af Amer: 32 mL/min — ABNORMAL LOW (ref >60)
GFR, EST NON AFRICAN AMERICAN: 28 mL/min — AB (ref >60)

## 2017-08-26 LAB — CBC WITH DIFFERENTIAL/PLATELET
BASOS ABS: 0 10*3/uL (ref 0.0–0.1)
BASOS PCT: 0 %
Eosinophils Absolute: 0.1 10*3/uL (ref 0.0–0.7)
Eosinophils Relative: 1 %
HEMATOCRIT: 28.1 % — AB (ref 36.0–46.0)
HEMOGLOBIN: 8.8 g/dL — AB (ref 12.0–15.0)
LYMPHS PCT: 8 %
Lymphs Abs: 0.7 10*3/uL (ref 0.7–4.0)
MCH: 28 pg (ref 26.0–34.0)
MCHC: 31.3 g/dL (ref 30.0–36.0)
MCV: 89.5 fL (ref 78.0–100.0)
MONO ABS: 0.7 10*3/uL (ref 0.1–1.0)
Monocytes Relative: 8 %
NEUTROS ABS: 6.8 10*3/uL (ref 1.7–7.7)
NEUTROS PCT: 83 %
Platelets: 484 10*3/uL — ABNORMAL HIGH (ref 150–400)
RBC: 3.14 MIL/uL — ABNORMAL LOW (ref 3.87–5.11)
RDW: 16.5 % — AB (ref 11.5–15.5)
WBC: 8.3 10*3/uL (ref 4.0–10.5)

## 2017-08-26 LAB — CBC
HCT: 26.1 % — ABNORMAL LOW (ref 36.0–46.0)
HEMOGLOBIN: 8 g/dL — AB (ref 12.0–15.0)
MCH: 27.6 pg (ref 26.0–34.0)
MCHC: 30.7 g/dL (ref 30.0–36.0)
MCV: 90 fL (ref 78.0–100.0)
PLATELETS: 301 10*3/uL (ref 150–400)
RBC: 2.9 MIL/uL — ABNORMAL LOW (ref 3.87–5.11)
RDW: 16.7 % — ABNORMAL HIGH (ref 11.5–15.5)
WBC: 7.7 10*3/uL (ref 4.0–10.5)

## 2017-08-26 LAB — COMPREHENSIVE METABOLIC PANEL
ALBUMIN: 3.2 g/dL — AB (ref 3.5–5.0)
ALT: 9 U/L — ABNORMAL LOW (ref 14–54)
ANION GAP: 10 (ref 5–15)
AST: 20 U/L (ref 15–41)
Alkaline Phosphatase: 70 U/L (ref 38–126)
BUN: 72 mg/dL — AB (ref 6–20)
CHLORIDE: 105 mmol/L (ref 101–111)
CO2: 27 mmol/L (ref 22–32)
Calcium: 8.4 mg/dL — ABNORMAL LOW (ref 8.9–10.3)
Creatinine, Ser: 1.95 mg/dL — ABNORMAL HIGH (ref 0.44–1.00)
GFR calc Af Amer: 26 mL/min — ABNORMAL LOW (ref >60)
GFR, EST NON AFRICAN AMERICAN: 23 mL/min — AB (ref >60)
GLUCOSE: 99 mg/dL (ref 65–99)
POTASSIUM: 4.1 mmol/L (ref 3.5–5.1)
Sodium: 142 mmol/L (ref 135–145)
Total Bilirubin: 0.8 mg/dL (ref 0.3–1.2)
Total Protein: 6.5 g/dL (ref 6.5–8.1)

## 2017-08-26 LAB — BRAIN NATRIURETIC PEPTIDE: B NATRIURETIC PEPTIDE 5: 66.5 pg/mL (ref 0.0–100.0)

## 2017-08-26 LAB — LACTIC ACID, PLASMA: Lactic Acid, Venous: 1.3 mmol/L (ref 0.5–1.9)

## 2017-08-26 LAB — LIPASE, BLOOD: LIPASE: 61 U/L — AB (ref 11–51)

## 2017-08-26 MED ORDER — ZOLPIDEM TARTRATE 5 MG PO TABS
5.0000 mg | ORAL_TABLET | Freq: Every evening | ORAL | Status: DC | PRN
  Administered 2017-08-26 – 2017-08-27 (×2): 5 mg via ORAL
  Filled 2017-08-26 (×2): qty 1

## 2017-08-26 MED ORDER — SENNOSIDES-DOCUSATE SODIUM 8.6-50 MG PO TABS
1.0000 | ORAL_TABLET | Freq: Two times a day (BID) | ORAL | Status: DC
  Administered 2017-08-27: 1 via ORAL
  Filled 2017-08-26: qty 1

## 2017-08-26 MED ORDER — FLUTICASONE PROPIONATE 50 MCG/ACT NA SUSP
2.0000 | Freq: Every day | NASAL | Status: DC
  Administered 2017-08-26 – 2017-08-28 (×3): 2 via NASAL
  Filled 2017-08-26: qty 16

## 2017-08-26 MED ORDER — ONDANSETRON HCL 4 MG PO TABS: 4.0000 mg | ORAL_TABLET | Freq: Four times a day (QID) | ORAL | Status: DC | PRN

## 2017-08-26 MED ORDER — ASPIRIN EC 325 MG PO TBEC
325.0000 mg | DELAYED_RELEASE_TABLET | Freq: Every day | ORAL | Status: DC
  Administered 2017-08-26 – 2017-08-28 (×3): 325 mg via ORAL
  Filled 2017-08-26 (×3): qty 1

## 2017-08-26 MED ORDER — SODIUM CHLORIDE 0.9 % IV SOLN
INTRAVENOUS | Status: AC
  Administered 2017-08-26 – 2017-08-27 (×2): via INTRAVENOUS

## 2017-08-26 MED ORDER — ENOXAPARIN SODIUM 30 MG/0.3ML ~~LOC~~ SOLN
30.0000 mg | Freq: Every day | SUBCUTANEOUS | Status: DC
  Administered 2017-08-26 – 2017-08-27 (×2): 30 mg via SUBCUTANEOUS
  Filled 2017-08-26 (×2): qty 0.3

## 2017-08-26 MED ORDER — SODIUM CHLORIDE 0.9 % IV SOLN
INTRAVENOUS | Status: DC
  Administered 2017-08-26: 17:00:00 via INTRAVENOUS

## 2017-08-26 MED ORDER — SODIUM CHLORIDE 0.9 % IV BOLUS (SEPSIS)
500.0000 mL | Freq: Once | INTRAVENOUS | Status: AC
  Administered 2017-08-26: 500 mL via INTRAVENOUS

## 2017-08-26 MED ORDER — POLYVINYL ALCOHOL 1.4 % OP SOLN
1.0000 [drp] | Freq: Four times a day (QID) | OPHTHALMIC | Status: DC
  Administered 2017-08-27 – 2017-08-28 (×5): 1 [drp] via OPHTHALMIC
  Filled 2017-08-26: qty 15

## 2017-08-26 MED ORDER — ZINC SULFATE 220 (50 ZN) MG PO CAPS
220.0000 mg | ORAL_CAPSULE | Freq: Every day | ORAL | Status: DC
  Administered 2017-08-26 – 2017-08-28 (×3): 220 mg via ORAL
  Filled 2017-08-26 (×4): qty 1

## 2017-08-26 MED ORDER — ACETAMINOPHEN 650 MG RE SUPP: 650.0000 mg | Freq: Four times a day (QID) | RECTAL | Status: DC | PRN

## 2017-08-26 MED ORDER — POLYETHYLENE GLYCOL 3350 17 G PO PACK: 17.0000 g | PACK | Freq: Every day | ORAL | Status: DC | PRN

## 2017-08-26 MED ORDER — PSYLLIUM 95 % PO PACK
1.0000 | PACK | Freq: Every day | ORAL | Status: DC
  Administered 2017-08-27 – 2017-08-28 (×3): 1 via ORAL
  Filled 2017-08-26 (×4): qty 1

## 2017-08-26 MED ORDER — ONDANSETRON HCL 4 MG/2ML IJ SOLN: 4.0000 mg | Freq: Four times a day (QID) | INTRAMUSCULAR | Status: DC | PRN

## 2017-08-26 MED ORDER — ACETAMINOPHEN 325 MG PO TABS: 650.0000 mg | ORAL_TABLET | Freq: Four times a day (QID) | ORAL | Status: DC | PRN

## 2017-08-26 MED ORDER — HYDROCODONE-ACETAMINOPHEN 5-325 MG PO TABS: 2.0000 | ORAL_TABLET | Freq: Four times a day (QID) | ORAL | Status: DC | PRN

## 2017-08-26 NOTE — ED Triage Notes (Signed)
Pt to ED via [facility] from nursing facility [facility] with c/o of patient not eating for the past two weeks and  Hypotension. Pt has hx of stroke and has verbal deficits.

## 2017-08-26 NOTE — ED Provider Notes (Signed)
[facility] DEPT Provider Note   CSN: [id number] Arrival date & time: 08/26/17  1601     History   Chief Complaint Chief Complaint  Patient presents with  . Hypotension  . Failure To Thrive    HPI Taylor Kramer is a 84 y.o. female.  Patient sent in from [facility] nursing facility.  She was sent for low blood pressure.  And for not eating and drinking very well.  Patient was admitted January 22 with a right hip fracture.  That underwent operative repair.  Postop complicated by some constipation.  Patient was discharged home recently.  Patient did have her constipation addressed when she was in the hospital.  Did have enemas did have MiraLAX and did have a digital disimpaction.  With some relief.  Nursing home is continue the MiraLAX.  Blood pressure upon arrival here systolic was 98.  Nursing reports patient's had previous stroke and has some speech difficulties.  Patient also known to have CHF.  And patient has crest syndrome.      Past Medical History:  Diagnosis Date  . CREST syndrome ([place])   . Hypertension   . Insomnia   . Neuropathy   . Osteoporosis   . Protein calorie malnutrition [facility])     Patient Active Problem List   Diagnosis Date Noted  . Dysphagia 08/21/2017  . Encounter for palliative care   . Goals of care, counseling/discussion   . Chronic diastolic CHF (congestive heart failure) ([place]) 08/20/2017  . Fall 08/19/2017  . Closed right hip fracture ([place]) 08/19/2017  . Hypokalemia 08/19/2017  . Protein-calorie malnutrition, severe 08/19/2017  . Cellulitis of left buttock   . Decubitus ulcer of back 03/19/2017  . Fecal impaction ([place]) 03/19/2017  . UTI (lower urinary tract infection) 11/21/2014  . Near syncope 11/21/2014  . Hypertension 11/21/2014  . History of CREST syndrome 11/21/2014    Past Surgical History:  Procedure Laterality Date  . ABDOMINAL HYSTERECTOMY    . CHOLECYSTECTOMY    . INTRAMEDULLARY (IM) NAIL  INTERTROCHANTERIC Right 08/19/2017   Procedure: INTRAMEDULLARY (IM) NAIL INTERTROCHANTRIC;  Surgeon: Mcarthur Rossetti, MD;  Location: [facility];  Service: Orthopedics;  Laterality: Right;  . renal stones      OB History    No data available       Home Medications    Prior to Admission medications   Medication Sig Start Date End Date Taking? Authorizing Provider  acetaminophen (TYLENOL) 500 MG tablet Take 500 mg by mouth every 6 (six) hours as needed for moderate pain.    [provider]  alendronate (FOSAMAX) 70 MG tablet Take 70 mg by mouth once a week. Take with a full glass of water on an empty stomach on Fridays.    [provider]  amLODipine (NORVASC) 2.5 MG tablet Take 2.5 mg by mouth daily.    [provider]  aspirin EC 325 MG EC tablet Take 1 tablet (325 mg total) by mouth daily with breakfast. 08/22/17   Mcarthur Rossetti, MD  cholecalciferol (VITAMIN D) 1000 UNITS tablet Take 1,000 Units by mouth daily.    [provider]  Cranberry 450 MG CAPS Take 1 capsule by mouth 2 (two) times daily.    [provider]  fluticasone (FLONASE) 50 MCG/ACT nasal spray Place 2 sprays into both nostrils daily.    [provider]  HYDROcodone-acetaminophen (NORCO/VICODIN) 5-325 MG tablet Take 1-2 tablets by mouth every 6 (six) hours as needed for moderate pain. 08/21/17  Mcarthur Rossetti, MD  levofloxacin (LEVAQUIN) 250 MG tablet Take 1.5 tablets (375 mg total) by mouth daily. 08/23/17   Annita Brod, MD  loratadine (CLARITIN) 10 MG tablet Take 10 mg by mouth daily as needed for allergies.     [provider]  polyethylene glycol (MIRALAX / GLYCOLAX) packet Take 17 g by mouth daily as needed for mild constipation.    [provider]  Propylene Glycol (SYSTANE BALANCE) 0.6 % SOLN Apply 1 drop to eye 4 (four) times daily.    [provider]  psyllium (KONSYL) 0.52 G capsule Take 0.52 g by mouth  daily.    [provider]  senna-docusate (SENOKOT-S) 8.6-50 MG tablet Take 1 tablet by mouth 2 (two) times daily. Patient taking differently: Take 1 tablet by mouth 2 (two) times daily as needed for mild constipation.  03/24/17   Domenic Polite, MD  vitamin C (ASCORBIC ACID) 500 MG tablet Take 500 mg by mouth daily.    [provider]  zinc sulfate (ZINC-220) 220 (50 Zn) MG capsule Take 220 mg by mouth daily.    [provider]  zolpidem (AMBIEN) 10 MG tablet Take 10 mg by mouth at bedtime.     [provider]    Family History Family History  Problem Relation Age of Onset  . Neuropathy Brother     Social History Social History   Tobacco Use  . Smoking status: Former Research scientist (life sciences)  . Smokeless tobacco: Never Used  Substance Use Topics  . Alcohol use: Yes    Comment: one drink a wek  . Drug use: No     Allergies   Patient has no known allergies.   Review of Systems Review of Systems  Constitutional: Negative for fever.  HENT: Negative for congestion.   Eyes: Negative for redness.  Respiratory: Negative for shortness of breath.   Cardiovascular: Negative for chest pain.  Gastrointestinal: Positive for abdominal distention and constipation. Negative for abdominal pain.  Genitourinary: Negative for dysuria.  Musculoskeletal: Negative for back pain.  Neurological: Positive for speech difficulty. Negative for headaches.  Hematological: Does not bruise/bleed easily.  Psychiatric/Behavioral: Negative for confusion.     Physical Exam Updated Vital Signs BP 124/65   Pulse 88   Temp 97.7 F (36.5 C) (Oral)   Resp 16   Ht 1.702 m (5\' 7" )   Wt 44.5 kg (98 lb)   SpO2 98%   BMI 15.35 kg/m   Physical Exam  Constitutional: She appears well-developed and well-nourished. No distress.  HENT:  Head: Normocephalic and atraumatic.  Mucous membranes very dry.  Eyes: Conjunctivae and EOM are normal. Pupils are equal, round, and reactive to light.    Neck: Neck supple.  Cardiovascular: Normal rate and regular rhythm.  Pulmonary/Chest: Effort normal and breath sounds normal.  Abdominal: Soft. Bowel sounds are normal. She exhibits distension. There is no tenderness.  Musculoskeletal: Normal range of motion. She exhibits no edema.  Neurological: She is alert. No cranial nerve deficit. She exhibits normal muscle tone. Coordination normal.  Patient with previous CVA does have some deficit.  But able to communicate will follow commands without any difficulties.  Skin: Skin is warm.  Nursing note and vitals reviewed.    ED Treatments / Results  Labs (all labs ordered are listed, but only abnormal results are displayed) Labs Reviewed  COMPREHENSIVE METABOLIC PANEL - Abnormal; Notable for the following components:      Result Value   BUN 72 (*)  Creatinine, Ser 1.95 (*)    Calcium 8.4 (*)    Albumin 3.2 (*)    ALT 9 (*)    GFR calc non Af Amer 23 (*)    GFR calc Af Amer 26 (*)    All other components within normal limits  LIPASE, BLOOD - Abnormal; Notable for the following components:   Lipase 61 (*)    All other components within normal limits  CBC WITH DIFFERENTIAL/PLATELET - Abnormal; Notable for the following components:   RBC 3.14 (*)    Hemoglobin 8.8 (*)    HCT 28.1 (*)    RDW 16.5 (*)    Platelets 484 (*)    All other components within normal limits  CULTURE, BLOOD (ROUTINE X 2)  CULTURE, BLOOD (ROUTINE X 2)  LACTIC ACID, PLASMA  BRAIN NATRIURETIC PEPTIDE  URINALYSIS, ROUTINE W REFLEX MICROSCOPIC    EKG  EKG Interpretation None       Radiology Dg Chest Port 1 View  Result Date: 08/26/2017 CLINICAL DATA:  84 year old with prior stroke, presenting with 2 week history of anorexia and acute hypotension. EXAM: PORTABLE CHEST 1 VIEW COMPARISON:  08/22/2017, 08/21/2017 dating back to 08/09/2010. FINDINGS: Cardiac silhouette normal in size, unchanged. Thoracic aorta tortuous and atherosclerotic, unchanged. Mild  bibasilar atelectasis, right greater than left. Lungs otherwise clear. Bronchovascular markings normal. No visible pleural effusions. Gaseous distention of the visualized colon as noted previously, though to a lesser degree than on the 08/22/2017 exam. IMPRESSION: 1. Mild bibasilar atelectasis, right greater than left. No acute cardiopulmonary disease otherwise. 2. Gaseous distention of the visualized colon which has improved since 08/22/2017. Electronically Signed   By: Evangeline Dakin M.D.   On: 08/26/2017 17:01    Procedures Procedures (including critical care time)  Medications Ordered in ED Medications  0.9 %  sodium chloride infusion ( Intravenous New Bag/Given 08/26/17 1723)  sodium chloride 0.9 % bolus 500 mL (500 mLs Intravenous New Bag/Given 08/26/17 1723)     Initial Impression / Assessment and Plan / ED Course  I have reviewed the triage vital signs and the nursing notes.  Pertinent labs & imaging results that were available during my care of the patient were reviewed by me and considered in my medical decision making (see chart for details).    Patient sent from nursing facility for hypotension.  Lowest blood pressure we have had here is been a systolic of 98.  Otherwise pressures have been pretty much systolic 026-378 patient clinically appears very dehydrated.  Patient's abdomen is distended.  Patient's family member states that she has known chronic constipation.  Worsening BUN and creatinine from a renal function standpoint from her last labs on January 25.  Did contact nursing facility they are able to give IV fluids they were actually doing that some.  Patient still seems clinically behind.  Contacted hospitalist for admission probably in observation overnight for further hydration.  Patient has not produced any urine yet.  Urinary tract infection not completely ruled out.  Feel that the abdominal distention is related to her chronic constipation.  Chest x-ray that was done which  was negative did show some signs of that being improved compared to before.  No significant abdominal tenderness.   Patient received a total of 1 L of fluids here IV.  Final Clinical Impressions(s) / ED Diagnoses   Final diagnoses:  Dehydration  Failure to thrive (0-17)  Constipation, chronic  Acute renal failure, unspecified acute renal failure type [facility])    ED  Discharge Orders    None       Fredia Sorrow, MD 08/26/17 2025

## 2017-08-26 NOTE — H&P (Signed)
History and Physical    Taylor Kramer VPX:[id number] DOB: 1931-11-07 DOA: 08/26/2017  PCP: Leeroy Cha, MD  Patient coming from: Skilled nursing facility.  Chief Complaint: Low blood pressure.  HPI: Taylor Kramer is a 84 y.o. female with history of hypertension, crest syndrome, anemia, constipation secondary to narcotics who was recently discharged from hospital last week after being admitted for right hip fracture was found to be hypotensive at skilled nursing facility.  As per the patient's family patient has not been eating well last few days.  Denies any diarrhea but patient has been having some constipation.  ED Course: In the ER patient's labs revealed worsening creatinine of 1.1-1.9 with elevated BUN.  UA showing features concerning for UTI.  On exam patient's abdomen is distended.  X-ray KUB is pending.  Patient admitted for dehydration.  Review of Systems: As per HPI, rest all negative.   Past Medical History:  Diagnosis Date  . CREST syndrome ([place])   . Hypertension   . Insomnia   . Neuropathy   . Osteoporosis   . Protein calorie malnutrition ([place])     Past Surgical History:  Procedure Laterality Date  . ABDOMINAL HYSTERECTOMY    . CHOLECYSTECTOMY    . INTRAMEDULLARY (IM) NAIL INTERTROCHANTERIC Right 08/19/2017   Procedure: INTRAMEDULLARY (IM) NAIL INTERTROCHANTRIC;  Surgeon: Mcarthur Rossetti, MD;  Location: [facility];  Service: Orthopedics;  Laterality: Right;  . renal stones       reports that she has quit smoking. she has never used smokeless tobacco. She reports that she drinks alcohol. She reports that she does not use drugs.  No Known Allergies  Family History  Problem Relation Age of Onset  . Neuropathy Brother     Prior to Admission medications   Medication Sig Start Date End Date Taking? Authorizing Provider  alendronate (FOSAMAX) 70 MG tablet Take 70 mg by mouth once a week. Take with a full glass of water on an empty stomach on  Fridays.   Yes [provider]  amLODipine (NORVASC) 2.5 MG tablet Take 2.5 mg by mouth daily.   Yes [provider]  aspirin EC 325 MG EC tablet Take 1 tablet (325 mg total) by mouth daily with breakfast. 08/22/17  Yes Mcarthur Rossetti, MD  cholecalciferol (VITAMIN D) 1000 UNITS tablet Take 1,000 Units by mouth daily.   Yes [provider]  Cranberry 450 MG CAPS Take 1 capsule by mouth 2 (two) times daily.   Yes [provider]  fluticasone (FLONASE) 50 MCG/ACT nasal spray Place 2 sprays into both nostrils daily.   Yes [provider]  HYDROcodone-acetaminophen (NORCO/VICODIN) 5-325 MG tablet Take 1-2 tablets by mouth every 6 (six) hours as needed for moderate pain. Patient taking differently: Take 2 tablets by mouth every 6 (six) hours as needed for moderate pain. pain 08/21/17  Yes Mcarthur Rossetti, MD  levofloxacin (LEVAQUIN) 250 MG tablet Take 1.5 tablets (375 mg total) by mouth daily. 08/23/17  Yes Annita Brod, MD  loratadine (CLARITIN) 10 MG tablet Take 10 mg by mouth daily as needed for allergies.    Yes [provider]  polyethylene glycol (MIRALAX / GLYCOLAX) packet Take 17 g by mouth daily as needed for mild constipation.   Yes [provider]  Propylene Glycol (SYSTANE BALANCE) 0.6 % SOLN Apply 1 drop to eye 4 (four) times daily.   Yes [provider]  psyllium (KONSYL) 0.52 G capsule Take 0.52 g by mouth daily.  Yes [provider]  senna-docusate (SENOKOT-S) 8.6-50 MG tablet Take 1 tablet by mouth 2 (two) times daily. 03/24/17  Yes Domenic Polite, MD  vitamin C (ASCORBIC ACID) 500 MG tablet Take 500 mg by mouth daily.   Yes [provider]  zinc sulfate (ZINC-220) 220 (50 Zn) MG capsule Take 220 mg by mouth daily.   Yes [provider]  zolpidem (AMBIEN) 10 MG tablet Take 10 mg by mouth at bedtime.    Yes [provider]  acetaminophen (TYLENOL) 500 MG tablet  Take 500 mg by mouth every 6 (six) hours as needed for moderate pain.    [provider]    Physical Exam: Vitals:   08/26/17 2000 08/26/17 2030 08/26/17 2100 08/26/17 2131  BP: 109/69 106/70 109/66 108/63  Pulse: 92 91 94 93  Resp:    14  Temp:      TempSrc:      SpO2: 99% 98% 97% 97%  Weight:      Height:          Constitutional: Moderately built and nourished. Vitals:   08/26/17 2000 08/26/17 2030 08/26/17 2100 08/26/17 2131  BP: 109/69 106/70 109/66 108/63  Pulse: 92 91 94 93  Resp:    14  Temp:      TempSrc:      SpO2: 99% 98% 97% 97%  Weight:      Height:       Eyes: Anicteric no pallor. ENMT: No discharge from the ears eyes nose or mouth. Neck: No mass felt.  No neck rigidity. Respiratory: No rhonchi or crepitations. Cardiovascular: S1-S2 heard no murmurs appreciated. Abdomen: Distended nontender bowel sounds present. Musculoskeletal: No edema.  No joint effusion. Skin: No rash.  Skin appears warm. Neurologic: Alert awake oriented to time place and person.  Moves all extremities. Psychiatric: Appears normal.  Normal affect.   Labs on Admission: I have personally reviewed following labs and imaging studies  CBC: Recent Labs  Lab 08/20/17 0934 08/21/17 0755 08/22/17 0432 08/23/17 0538 08/26/17 1637  WBC 10.5 12.3* 7.2 9.0 8.3  NEUTROABS  --   --   --   --  6.8  HGB 9.5* 9.3* 8.4* 8.4* 8.8*  HCT 29.6* 30.0* 26.9* 26.8* 28.1*  MCV 87.6 88.5 88.2 88.7 89.5  PLT 298 323 268 299 086*   Basic Metabolic Panel: Recent Labs  Lab 08/20/17 0934 08/21/17 0755 08/22/17 0432 08/26/17 1637  NA 140 138 140 142  K 4.4 4.7 4.7 4.1  CL 102 100* 103 105  CO2 25 29 28 27   GLUCOSE 134* 104* 101* 99  BUN 21* 32* 41* 72*  CREATININE 1.10* 1.30* 1.15* 1.95*  CALCIUM 8.0* 8.7* 8.9 8.4*   GFR: Estimated Creatinine Clearance: 15.4 mL/min (A) (by C-G formula based on SCr of 1.95 mg/dL (H)). Liver Function Tests: Recent Labs  Lab 08/26/17 1637  AST 20    ALT 9*  ALKPHOS 70  BILITOT 0.8  PROT 6.5  ALBUMIN 3.2*   Recent Labs  Lab 08/26/17 1637  LIPASE 61*   No results for input(s): AMMONIA in the last 168 hours. Coagulation Profile: No results for input(s): INR, PROTIME in the last 168 hours. Cardiac Enzymes: No results for input(s): CKTOTAL, CKMB, CKMBINDEX, TROPONINI in the last 168 hours. BNP (last 3 results) No results for input(s): PROBNP in the last 8760 hours. HbA1C: No results for input(s): HGBA1C in the last 72 hours. CBG: No results for input(s): GLUCAP in the last 168 hours.  Lipid Profile: No results for input(s): CHOL, HDL, LDLCALC, TRIG, CHOLHDL, LDLDIRECT in the last 72 hours. Thyroid Function Tests: No results for input(s): TSH, T4TOTAL, FREET4, T3FREE, THYROIDAB in the last 72 hours. Anemia Panel: No results for input(s): VITAMINB12, FOLATE, FERRITIN, TIBC, IRON, RETICCTPCT in the last 72 hours. Urine analysis:    Component Value Date/Time   COLORURINE YELLOW 03/19/2017 0424   APPEARANCEUR CLEAR 03/19/2017 0424   LABSPEC 1.013 03/19/2017 0424   PHURINE 6.0 03/19/2017 0424   GLUCOSEU NEGATIVE 03/19/2017 0424   HGBUR MODERATE (A) 03/19/2017 0424   BILIRUBINUR NEGATIVE 03/19/2017 0424   KETONESUR NEGATIVE 03/19/2017 0424   PROTEINUR NEGATIVE 03/19/2017 0424   UROBILINOGEN 1.0 11/21/2014 2046   NITRITE NEGATIVE 03/19/2017 0424   LEUKOCYTESUR SMALL (A) 03/19/2017 0424   Sepsis Labs: @LABRCNTIP (procalcitonin:4,lacticidven:4) )No results found for this or any previous visit (from the past 240 hour(s)).   Radiological Exams on Admission: Dg Chest Port 1 View  Result Date: 08/26/2017 CLINICAL DATA:  84 year old with prior stroke, presenting with 2 week history of anorexia and acute hypotension. EXAM: PORTABLE CHEST 1 VIEW COMPARISON:  08/22/2017, 08/21/2017 dating back to 08/09/2010. FINDINGS: Cardiac silhouette normal in size, unchanged. Thoracic aorta tortuous and atherosclerotic, unchanged. Mild bibasilar  atelectasis, right greater than left. Lungs otherwise clear. Bronchovascular markings normal. No visible pleural effusions. Gaseous distention of the visualized colon as noted previously, though to a lesser degree than on the 08/22/2017 exam. IMPRESSION: 1. Mild bibasilar atelectasis, right greater than left. No acute cardiopulmonary disease otherwise. 2. Gaseous distention of the visualized colon which has improved since 08/22/2017. Electronically Signed   By: Evangeline Dakin M.D.   On: 08/26/2017 17:01     Assessment/Plan Principal Problem:   ARF (acute renal failure) (HCC) Active Problems:   Hypertension   History of CREST syndrome   Chronic diastolic CHF (congestive heart failure) ([place])    1. Acute renal failure likely from dehydration from poor oral intake -gently hydrate and closely for intake output and metabolic panel. 2. Constipation with abdominal distention.  Will check KUB.  I have ordered 1 dose of soap suds. 3. History of crest with dysphagia on dysphagia 1 diet. 4. Hypertension presently patient has low normal blood pressure will hold Norvasc and keep patient on PRN IV hydralazine and closely follow blood pressure trends. 5. Anemia normocytic normochromic will check anemia panel.  Follow CBC. 6. Possible UTI presently placed on ceftriaxone check urine cultures. 7. Recent hip fracture.   DVT prophylaxis: Lovenox. Code Status: DNR. Family Communication: Family at the bedside. Disposition Plan: Skilled nursing facility. Consults called: Physical therapy. Admission status: Observation.   Rise Patience MD [facility] Pager [phone].  If 7PM-7AM, please contact night-coverage [email or web address] Password [facility]  08/26/2017, 9:43 PM

## 2017-08-26 NOTE — ED Notes (Signed)
Family- Son Marya Amsler [phone] Wants notified when has room and what room

## 2017-08-26 NOTE — ED Notes (Signed)
Bed: WA04 Expected date:  Expected time:  Means of arrival:  Comments: EMS-lethargy

## 2017-08-27 ENCOUNTER — Other Ambulatory Visit: Payer: Self-pay

## 2017-08-27 ENCOUNTER — Observation Stay (HOSPITAL_COMMUNITY): Payer: Medicare Other

## 2017-08-27 DIAGNOSIS — Z90710 Acquired absence of both cervix and uterus: Secondary | ICD-10-CM | POA: Diagnosis not present

## 2017-08-27 DIAGNOSIS — I110 Hypertensive heart disease with heart failure: Secondary | ICD-10-CM | POA: Diagnosis present

## 2017-08-27 DIAGNOSIS — I10 Essential (primary) hypertension: Secondary | ICD-10-CM

## 2017-08-27 DIAGNOSIS — K5900 Constipation, unspecified: Secondary | ICD-10-CM | POA: Diagnosis not present

## 2017-08-27 DIAGNOSIS — N179 Acute kidney failure, unspecified: Secondary | ICD-10-CM | POA: Diagnosis not present

## 2017-08-27 DIAGNOSIS — K5909 Other constipation: Secondary | ICD-10-CM | POA: Diagnosis not present

## 2017-08-27 DIAGNOSIS — M6389 Disorders of muscle in diseases classified elsewhere, multiple sites: Secondary | ICD-10-CM | POA: Diagnosis not present

## 2017-08-27 DIAGNOSIS — R2681 Unsteadiness on feet: Secondary | ICD-10-CM | POA: Diagnosis not present

## 2017-08-27 DIAGNOSIS — Z681 Body mass index (BMI) 19 or less, adult: Secondary | ICD-10-CM | POA: Diagnosis not present

## 2017-08-27 DIAGNOSIS — E869 Volume depletion, unspecified: Secondary | ICD-10-CM | POA: Diagnosis not present

## 2017-08-27 DIAGNOSIS — Z87891 Personal history of nicotine dependence: Secondary | ICD-10-CM | POA: Diagnosis not present

## 2017-08-27 DIAGNOSIS — Z66 Do not resuscitate: Secondary | ICD-10-CM | POA: Diagnosis present

## 2017-08-27 DIAGNOSIS — I959 Hypotension, unspecified: Secondary | ICD-10-CM | POA: Diagnosis not present

## 2017-08-27 DIAGNOSIS — E43 Unspecified severe protein-calorie malnutrition: Secondary | ICD-10-CM | POA: Diagnosis not present

## 2017-08-27 DIAGNOSIS — I5032 Chronic diastolic (congestive) heart failure: Secondary | ICD-10-CM | POA: Diagnosis not present

## 2017-08-27 DIAGNOSIS — Z7982 Long term (current) use of aspirin: Secondary | ICD-10-CM | POA: Diagnosis not present

## 2017-08-27 DIAGNOSIS — E860 Dehydration: Secondary | ICD-10-CM | POA: Diagnosis present

## 2017-08-27 DIAGNOSIS — M810 Age-related osteoporosis without current pathological fracture: Secondary | ICD-10-CM | POA: Diagnosis present

## 2017-08-27 DIAGNOSIS — M6281 Muscle weakness (generalized): Secondary | ICD-10-CM | POA: Diagnosis not present

## 2017-08-27 DIAGNOSIS — M341 CR(E)ST syndrome: Secondary | ICD-10-CM | POA: Diagnosis present

## 2017-08-27 DIAGNOSIS — Z8673 Personal history of transient ischemic attack (TIA), and cerebral infarction without residual deficits: Secondary | ICD-10-CM | POA: Diagnosis not present

## 2017-08-27 DIAGNOSIS — S72001D Fracture of unspecified part of neck of right femur, subsequent encounter for closed fracture with routine healing: Secondary | ICD-10-CM | POA: Diagnosis not present

## 2017-08-27 DIAGNOSIS — R627 Adult failure to thrive: Secondary | ICD-10-CM | POA: Diagnosis present

## 2017-08-27 DIAGNOSIS — R278 Other lack of coordination: Secondary | ICD-10-CM | POA: Diagnosis not present

## 2017-08-27 DIAGNOSIS — Z4789 Encounter for other orthopedic aftercare: Secondary | ICD-10-CM | POA: Diagnosis not present

## 2017-08-27 DIAGNOSIS — J690 Pneumonitis due to inhalation of food and vomit: Secondary | ICD-10-CM | POA: Diagnosis not present

## 2017-08-27 LAB — URINALYSIS, ROUTINE W REFLEX MICROSCOPIC
BILIRUBIN URINE: NEGATIVE
Glucose, UA: NEGATIVE mg/dL
Hgb urine dipstick: NEGATIVE
Ketones, ur: NEGATIVE mg/dL
Nitrite: NEGATIVE
PH: 5 (ref 5.0–8.0)
Protein, ur: 30 mg/dL — AB
Specific Gravity, Urine: 1.026 (ref 1.005–1.030)

## 2017-08-27 LAB — BASIC METABOLIC PANEL
Anion gap: 7 (ref 5–15)
BUN: 65 mg/dL — ABNORMAL HIGH (ref 6–20)
CO2: 25 mmol/L (ref 22–32)
CREATININE: 1.44 mg/dL — AB (ref 0.44–1.00)
Calcium: 7.3 mg/dL — ABNORMAL LOW (ref 8.9–10.3)
Chloride: 113 mmol/L — ABNORMAL HIGH (ref 101–111)
GFR calc non Af Amer: 33 mL/min — ABNORMAL LOW (ref >60)
GFR, EST AFRICAN AMERICAN: 38 mL/min — AB (ref >60)
Glucose, Bld: 87 mg/dL (ref 65–99)
POTASSIUM: 3.4 mmol/L — AB (ref 3.5–5.1)
SODIUM: 145 mmol/L (ref 135–145)

## 2017-08-27 LAB — CBC
HEMATOCRIT: 25.1 % — AB (ref 36.0–46.0)
Hemoglobin: 7.9 g/dL — ABNORMAL LOW (ref 12.0–15.0)
MCH: 27.8 pg (ref 26.0–34.0)
MCHC: 31.5 g/dL (ref 30.0–36.0)
MCV: 88.4 fL (ref 78.0–100.0)
Platelets: 464 10*3/uL — ABNORMAL HIGH (ref 150–400)
RBC: 2.84 MIL/uL — AB (ref 3.87–5.11)
RDW: 16.5 % — ABNORMAL HIGH (ref 11.5–15.5)
WBC: 7.3 10*3/uL (ref 4.0–10.5)

## 2017-08-27 LAB — RETICULOCYTES
RBC.: 2.84 MIL/uL — ABNORMAL LOW (ref 3.87–5.11)
RETIC COUNT ABSOLUTE: 59.6 10*3/uL (ref 19.0–186.0)
RETIC CT PCT: 2.1 % (ref 0.4–3.1)

## 2017-08-27 LAB — MRSA PCR SCREENING: MRSA BY PCR: POSITIVE — AB

## 2017-08-27 LAB — IRON AND TIBC
Iron: 11 ug/dL — ABNORMAL LOW (ref 28–170)
Saturation Ratios: 5 % — ABNORMAL LOW (ref 10.4–31.8)
TIBC: 217 ug/dL — ABNORMAL LOW (ref 250–450)
UIBC: 206 ug/dL

## 2017-08-27 LAB — FOLATE: FOLATE: 6.6 ng/mL (ref >5.9)

## 2017-08-27 LAB — VITAMIN B12: VITAMIN B 12: 247 pg/mL (ref 180–914)

## 2017-08-27 LAB — FERRITIN: Ferritin: 174 ng/mL (ref 11–307)

## 2017-08-27 MED ORDER — SORBITOL 70 % SOLN: 960.0000 mL | TOPICAL_OIL | Freq: Once | ORAL | Status: DC | PRN

## 2017-08-27 MED ORDER — POLYETHYLENE GLYCOL 3350 17 G PO PACK
17.0000 g | PACK | Freq: Two times a day (BID) | ORAL | Status: DC
  Administered 2017-08-27 – 2017-08-28 (×2): 17 g via ORAL
  Filled 2017-08-27 (×3): qty 1

## 2017-08-27 MED ORDER — RESOURCE THICKENUP CLEAR PO POWD
ORAL | Status: DC | PRN
  Administered 2017-08-28: 10:00:00 via ORAL
  Filled 2017-08-27: qty 125

## 2017-08-27 MED ORDER — MUPIROCIN 2 % EX OINT
1.0000 "application " | TOPICAL_OINTMENT | Freq: Two times a day (BID) | CUTANEOUS | Status: DC
  Administered 2017-08-27 – 2017-08-28 (×2): 1 via NASAL
  Filled 2017-08-27: qty 22

## 2017-08-27 MED ORDER — HYDRALAZINE HCL 20 MG/ML IJ SOLN: 5.0000 mg | INTRAMUSCULAR | Status: DC | PRN

## 2017-08-27 MED ORDER — DEXTROSE 5 % IV SOLN
1.0000 g | INTRAVENOUS | Status: DC
  Administered 2017-08-27 – 2017-08-28 (×2): 1 g via INTRAVENOUS
  Filled 2017-08-27 (×3): qty 10

## 2017-08-27 MED ORDER — SODIUM CHLORIDE 0.9 % IV SOLN
INTRAVENOUS | Status: DC
  Administered 2017-08-28: 10:00:00 via INTRAVENOUS

## 2017-08-27 MED ORDER — CHLORHEXIDINE GLUCONATE CLOTH 2 % EX PADS
6.0000 | MEDICATED_PAD | Freq: Every day | CUTANEOUS | Status: DC
  Administered 2017-08-28: 6 via TOPICAL

## 2017-08-27 MED ORDER — BISACODYL 10 MG RE SUPP
10.0000 mg | Freq: Every day | RECTAL | Status: DC
  Administered 2017-08-27 – 2017-08-28 (×2): 10 mg via RECTAL
  Filled 2017-08-27 (×2): qty 1

## 2017-08-27 MED ORDER — POLYETHYLENE GLYCOL 3350 17 G PO PACK: 17.0000 g | PACK | Freq: Two times a day (BID) | ORAL | Status: DC

## 2017-08-27 NOTE — ED Notes (Signed)
Didn't mean to click off soap suds enema, pt did have 2 bowel movements this evening

## 2017-08-27 NOTE — Plan of Care (Signed)
  Elimination: Will not experience complications related to bowel motility 08/27/2017 1430 - Progressing by Dorene Sorrow, RN   Nutrition: Adequate nutrition will be maintained 08/27/2017 1430 - Progressing by Dorene Sorrow, RN   Safety: Ability to remain free from injury will improve 08/27/2017 1430 - Progressing by Dorene Sorrow, RN

## 2017-08-27 NOTE — ED Notes (Signed)
Pt resting in bed with eyes closed. Respirations even and unlabored. Pt expressed no concerns or needs at this time. Will continue to monitor

## 2017-08-27 NOTE — Progress Notes (Signed)
Pharmacy Antibiotic Note  Taylor Kramer is a 84 y.o. female admitted on 08/26/2017 with UTI.  Pharmacy has been consulted for ceftriaxone dosing.  Plan: Ceftriaxone 1gm IV q24h Pharmacy will sign off as adjustment isn't needed in renal dysfunction  Height: 5\' 7"  (170.2 cm) Weight: 98 lb (44.5 kg) IBW/kg (Calculated) : 61.6  Temp (24hrs), Avg:97.7 F (36.5 C), Min:97.7 F (36.5 C), Max:97.7 F (36.5 C)  Recent Labs  Lab 08/20/17 0934 08/21/17 0755 08/22/17 0432 08/23/17 0538 08/26/17 1637 08/26/17 2212  WBC 10.5 12.3* 7.2 9.0 8.3 7.7  CREATININE 1.10* 1.30* 1.15*  --  1.95* 1.64*  LATICACIDVEN  --   --   --   --  1.3  --     Estimated Creatinine Clearance: 18.3 mL/min (A) (by C-G formula based on SCr of 1.64 mg/dL (H)).    No Known Allergies  Thank you for allowing pharmacy to be a part of this patient's care.  Dolly Rias RPh 08/27/2017, 8:12 AM Pager [phone]

## 2017-08-27 NOTE — Clinical Social Work Note (Signed)
CSW consulted for "patient from [facility]". Patient recently assessed on 08/20/17 please see psychosocial assessment below, no psychosocial changes reported. CSW spoke with patient and patient's son Guadelupe Sabin), they both confirmed plan for patient to return to [facility] SNF to complete rehab. [name] reported that patient will need PTAR for transport. CSW contacted [facility] SN[facility], staff confirmed patient's ability to return. CSW completed FL2. CSW will continue to follow and assist with discharge planning.    Clinical Social Work Assessment  Patient Details  Name: Taylor Kramer MRN: [id number] Date of Birth: Dec 20, 1931  Date of referral:   08/27/2017               Reason for consult:   Facility Placement; Discharge Planning                Permission sought to share information with:   Customer service manager; Family Supports Permission granted to share information::   Yes, verbal permission granted  Name::      Guadelupe Sabin   Agency::   [facility]  Relationship::   Son  Contact Information:   [phone]  Housing/Transportation Living arrangements for the past 2 months:   [place] of Information:   Patient; Son Patient Interpreter Needed:   None Criminal Activity/Legal Involvement Pertinent to Current Situation/Hospitalization:   No Significant Relationships:   Adult Children Lives with:   Facility Resident Do you feel safe going back to the place where you live?   Yes Need for family participation in patient care:   No  Care giving concerns:  Patient from assisted Living-[facility] and may be able to return to assisted living as they do provide some therapy in house. CSW will await for PT/OT notes so that assisted living can review.  Staff at assisted confirmed that they assisted patient with bathing and dressing daily.  CSW met with patient and son at bedside. He indicated that mom will need skilled nursing as [facility] cannot provide the  level of care needed. He would like her to be at a skilled nursing facility close to [facility]. CSW provided list of SNF's so that son and patient can discuss and select a SNF.  CSW f/u for PT evaluation.  Social Worker assessment / plan:  CSW will assist with disposition.  Employment status:   Retired  Forensic scientist:   Medicare PT Recommendations:   [place] / Referral to community resources:   [place]  Patient/Family's Response to care:  Patient/son thanked CSW for her time with explaining SNF process. CSW answered questions and they reported no concerns. They are in agreement with recommendations for SNF.  Patient/Family's Understanding of and Emotional Response to Diagnosis, Current Treatment, and Prognosis:  Patient from  Assisted living facility and aware of her physical limitations and health needs. Patient and son understand that [facility] cannot care for patient given her new impairment and they are in agreement with the recommendations for SNF. Pt hopes to improve and then return to [facility]. No issues or concerns at this time.  Emotional Assessment Appearance:   Appears stated age Attitude/Demeanor/Rapport:   (cooperative) Affect (typically observed):   Accepting, Appropriate Orientation:   Oriented to situation, Oriented to time, Oriented to place, Oriented to self Alcohol / Substance use:   Not applicable  Psych involvement (Current and /or in the community):   No (Comment)  Discharge Needs  Concerns to be addressed:   Discharge Planning Concerns Readmission within the  last 30 days:   No Current discharge risk:   Dependent with Mobility, Physical Imapirment Barriers to Discharge:   No Barriers Identified   Burnis Medin, LCSW 08/27/2017, 12:10 PM

## 2017-08-27 NOTE — Progress Notes (Signed)
PT Cancellation Note  Patient Details Name: Taylor Kramer MRN: [id number] DOB: 03-Jun-1932   Cancelled Treatment:    Reason Eval/Treat Not Completed: Patient declined," I want to Sleep."  [name] 08/27/2017, 5:03 PM Tresa Endo PT [phone]

## 2017-08-27 NOTE — ED Notes (Signed)
ED TO INPATIENT HANDOFF REPORT  Name/Age/Gender Taylor Kramer 84 y.o. female  Code Status    Code Status Orders  (From admission, onward)        Start     Ordered   08/26/17 2142  Do not attempt resuscitation (DNR)  Continuous    Question Answer Comment  In the event of cardiac or respiratory ARREST Do not call a "code blue"   In the event of cardiac or respiratory ARREST Do not perform Intubation, CPR, defibrillation or ACLS   In the event of cardiac or respiratory ARREST Use medication by any route, position, wound care, and other measures to relive pain and suffering. May use oxygen, suction and manual treatment of airway obstruction as needed for comfort.      08/26/17 2143    Code Status History    Date Active Date Inactive Code Status Order ID Comments User Context   08/19/2017 03:57 08/23/2017 19:11 DNR [id number]  Ivor Costa, MD ED   03/19/2017 10:02 03/24/2017 21:05 DNR [id number]  Vashti Hey, MD ED   11/21/2014 23:28 11/24/2014 16:46 Full Code [id number]  Rise Patience, MD Inpatient      Home/SNF/Other Home  Chief Complaint weakness   Level of Care/Admitting Diagnosis ED Disposition    ED Disposition Condition Comment   [place] [facility]: [facility] [[id number]]  Level of Care: Telemetry [5]  Admit to tele based on following criteria: Monitor for Ischemic changes  Diagnosis: ARF (acute renal failure) [facility]) [[id number]]  Admitting Physician: Rise Patience [id number]  Attending Physician: Rise Patience [email or web address]  PT Class (Do Not Modify): Observation [104]  PT Acc Code (Do Not Modify): Observation [10022]       Medical History Past Medical History:  Diagnosis Date  . CREST syndrome ([place])   . Hypertension   . Insomnia   . Neuropathy   . Osteoporosis   . Protein calorie malnutrition ([place])     Allergies No Known Allergies  IV Location/Drains/Wounds Patient Lines/Drains/Airways Status   Active  Line/Drains/Airways    Name:   Placement date:   Placement time:   Site:   Days:   Peripheral IV 08/26/17 Left Antecubital   08/26/17    1720    Antecubital   1   Incision (Closed) 08/19/17 Hip Right   08/19/17    1829     8   Pressure Injury 03/19/17 Stage IV - Full thickness tissue loss with exposed bone, tendon or muscle.   03/19/17    1159     161   Wound / Incision (Open or Dehisced) 08/19/17 Non-pressure wound Buttocks Left   08/19/17    0510    Buttocks   8          Labs/Imaging Results for orders placed or performed during the hospital encounter of 08/26/17 (from the past 48 hour(s))  Comprehensive metabolic panel     Status: Abnormal   Collection Time: 08/26/17  4:37 PM  Result Value Ref Range   Sodium 142 135 - 145 mmol/L   Potassium 4.1 3.5 - 5.1 mmol/L   Chloride 105 101 - 111 mmol/L   CO2 27 22 - 32 mmol/L   Glucose, Bld 99 65 - 99 mg/dL   BUN 72 (H) 6 - 20 mg/dL   Creatinine, Ser 1.95 (H) 0.44 - 1.00 mg/dL   Calcium 8.4 (L) 8.9 - 10.3 mg/dL   Total Protein 6.5 6.5 - 8.1 g/dL   Albumin 3.2 (  L) 3.5 - 5.0 g/dL   AST 20 15 - 41 U/L   ALT 9 (L) 14 - 54 U/L   Alkaline Phosphatase 70 38 - 126 U/L   Total Bilirubin 0.8 0.3 - 1.2 mg/dL   GFR calc non Af Amer 23 (L) >60 mL/min   GFR calc Af Amer 26 (L) >60 mL/min    Comment: (NOTE) The eGFR has been calculated using the CKD EPI equation. This calculation has not been validated in all clinical situations. eGFR's persistently <60 mL/min signify possible Chronic Kidney Disease.    Anion gap 10 5 - 15  Lipase, blood     Status: Abnormal   Collection Time: 08/26/17  4:37 PM  Result Value Ref Range   Lipase 61 (H) 11 - 51 U/L  CBC with Differential/Platelet     Status: Abnormal   Collection Time: 08/26/17  4:37 PM  Result Value Ref Range   WBC 8.3 4.0 - 10.5 K/uL   RBC 3.14 (L) 3.87 - 5.11 MIL/uL   Hemoglobin 8.8 (L) 12.0 - 15.0 g/dL   HCT 28.1 (L) 36.0 - 46.0 %   MCV 89.5 78.0 - 100.0 fL   MCH 28.0 26.0 - 34.0 pg    MCHC 31.3 30.0 - 36.0 g/dL   RDW 16.5 (H) 11.5 - 15.5 %   Platelets 484 (H) 150 - 400 K/uL   Neutrophils Relative % 83 %   Neutro Abs 6.8 1.7 - 7.7 K/uL   Lymphocytes Relative 8 %   Lymphs Abs 0.7 0.7 - 4.0 K/uL   Monocytes Relative 8 %   Monocytes Absolute 0.7 0.1 - 1.0 K/uL   Eosinophils Relative 1 %   Eosinophils Absolute 0.1 0.0 - 0.7 K/uL   Basophils Relative 0 %   Basophils Absolute 0.0 0.0 - 0.1 K/uL  Lactic acid, plasma     Status: None   Collection Time: 08/26/17  4:37 PM  Result Value Ref Range   Lactic Acid, Venous 1.3 0.5 - 1.9 mmol/L  Brain natriuretic peptide     Status: None   Collection Time: 08/26/17  4:37 PM  Result Value Ref Range   B Natriuretic Peptide 66.5 0.0 - 100.0 pg/mL  CBC     Status: Abnormal   Collection Time: 08/26/17 10:12 PM  Result Value Ref Range   WBC 7.7 4.0 - 10.5 K/uL   RBC 2.90 (L) 3.87 - 5.11 MIL/uL   Hemoglobin 8.0 (L) 12.0 - 15.0 g/dL   HCT 26.1 (L) 36.0 - 46.0 %   MCV 90.0 78.0 - 100.0 fL   MCH 27.6 26.0 - 34.0 pg   MCHC 30.7 30.0 - 36.0 g/dL   RDW 16.7 (H) 11.5 - 15.5 %   Platelets 301 150 - 400 K/uL  Creatinine, serum     Status: Abnormal   Collection Time: 08/26/17 10:12 PM  Result Value Ref Range   Creatinine, Ser 1.64 (H) 0.44 - 1.00 mg/dL   GFR calc non Af Amer 28 (L) >60 mL/min   GFR calc Af Amer 32 (L) >60 mL/min    Comment: (NOTE) The eGFR has been calculated using the CKD EPI equation. This calculation has not been validated in all clinical situations. eGFR's persistently <60 mL/min signify possible Chronic Kidney Disease.   Urinalysis, Routine w reflex microscopic     Status: Abnormal   Collection Time: 08/27/17 12:55 AM  Result Value Ref Range   Color, Urine AMBER (A) YELLOW    Comment: BIOCHEMICALS MAY BE AFFECTED  BY COLOR   APPearance HAZY (A) CLEAR   Specific Gravity, Urine 1.026 1.005 - 1.030   pH 5.0 5.0 - 8.0   Glucose, UA NEGATIVE NEGATIVE mg/dL   Hgb urine dipstick NEGATIVE NEGATIVE   Bilirubin  Urine NEGATIVE NEGATIVE   Ketones, ur NEGATIVE NEGATIVE mg/dL   Protein, ur 30 (A) NEGATIVE mg/dL   Nitrite NEGATIVE NEGATIVE   Leukocytes, UA SMALL (A) NEGATIVE   RBC / HPF 6-30 0 - 5 RBC/hpf   WBC, UA 6-30 0 - 5 WBC/hpf   Bacteria, UA RARE (A) NONE SEEN   Squamous Epithelial / LPF 0-5 (A) NONE SEEN   Mucus PRESENT    Hyaline Casts, UA PRESENT    Ca Oxalate Crys, UA PRESENT    Dg Abd 1 View  Result Date: 08/27/2017 CLINICAL DATA:  Constipation.  Not eating for 2 weeks. EXAM: ABDOMEN - 1 VIEW COMPARISON:  CT abdomen and pelvis 08/22/2017. Abdominal series 08/22/2017. FINDINGS: As seen on previous images, there is diffuse distention of the colon which is filled with stool and gas. Probable mild dilatation of small bowel as well although differentiating small from large bowel is difficult due to the large amount of gas and stool in the colon. Large amount of stool in the rectum. Residual contrast material is demonstrated in the colon. Similar appearance to previous study. Surgical clips in the right upper quadrant. Postoperative changes in the right hip. IMPRESSION: Large amount of gas and stool throughout the colon, particularly in the rectum. Colonic and small bowel dilatation. Changes likely due to ileus and constipation. Similar appearance to previous study. Electronically Signed   By: Lucienne Capers M.D.   On: 08/27/2017 02:51   Dg Chest Port 1 View  Result Date: 08/26/2017 CLINICAL DATA:  84 year old with prior stroke, presenting with 2 week history of anorexia and acute hypotension. EXAM: PORTABLE CHEST 1 VIEW COMPARISON:  08/22/2017, 08/21/2017 dating back to 08/09/2010. FINDINGS: Cardiac silhouette normal in size, unchanged. Thoracic aorta tortuous and atherosclerotic, unchanged. Mild bibasilar atelectasis, right greater than left. Lungs otherwise clear. Bronchovascular markings normal. No visible pleural effusions. Gaseous distention of the visualized colon as noted previously, though to  a lesser degree than on the 08/22/2017 exam. IMPRESSION: 1. Mild bibasilar atelectasis, right greater than left. No acute cardiopulmonary disease otherwise. 2. Gaseous distention of the visualized colon which has improved since 08/22/2017. Electronically Signed   By: Evangeline Dakin M.D.   On: 08/26/2017 17:01    Pending Labs Unresulted Labs (From admission, onward)   Start     Ordered   09/02/17 0500  Creatinine, serum  (enoxaparin (LOVENOX)    CrCl >/= 30 ml/min)  Weekly,   R    Comments:  while on enoxaparin therapy    08/26/17 2143   08/27/17 6301  Basic metabolic panel  Tomorrow morning,   R     08/26/17 2143   08/27/17 0500  CBC  Tomorrow morning,   R     08/26/17 2143   08/26/17 1637  Culture, blood (Routine X 2) w Reflex to ID Panel  BLOOD CULTURE X 2,   R     08/26/17 1637      Vitals/Pain Today's Vitals   08/27/17 0245 08/27/17 0300 08/27/17 0330 08/27/17 0730  BP:  (!) 107/59 103/66 108/61  Pulse:   92 93  Resp:      Temp:      TempSrc:      SpO2: 96%  94% 98%  Weight:  Height:      PainSc:        Isolation Precautions No active isolations  Medications Medications  aspirin EC tablet 325 mg (325 mg Oral Given 08/26/17 2354)  fluticasone (FLONASE) 50 MCG/ACT nasal spray 2 spray (2 sprays Each Nare Given 08/26/17 2354)  HYDROcodone-acetaminophen (NORCO/VICODIN) 5-325 MG per tablet 2 tablet (not administered)  psyllium (HYDROCIL/METAMUCIL) packet 1 packet (1 packet Oral Given 08/27/17 0034)  polyvinyl alcohol (LIQUIFILM TEARS) 1.4 % ophthalmic solution 1 drop (not administered)  polyethylene glycol (MIRALAX / GLYCOLAX) packet 17 g (not administered)  senna-docusate (Senokot-S) tablet 1 tablet (1 tablet Oral Given 08/27/17 0034)  zinc sulfate capsule 220 mg (220 mg Oral Given 08/26/17 2353)  zolpidem (AMBIEN) tablet 5 mg (5 mg Oral Given 08/26/17 2210)  acetaminophen (TYLENOL) tablet 650 mg (not administered)    Or  acetaminophen (TYLENOL) suppository 650 mg (not  administered)  ondansetron (ZOFRAN) tablet 4 mg (not administered)    Or  ondansetron (ZOFRAN) injection 4 mg (not administered)  enoxaparin (LOVENOX) injection 30 mg (30 mg Subcutaneous Given 08/26/17 2355)  0.9 %  sodium chloride infusion ( Intravenous New Bag/Given 08/26/17 2353)  sodium chloride 0.9 % bolus 500 mL (0 mLs Intravenous Stopped 08/26/17 1952)  sodium chloride 0.9 % bolus 500 mL (0 mLs Intravenous Stopped 08/26/17 2130)    Mobility walks

## 2017-08-27 NOTE — Progress Notes (Signed)
[facility] PROGRESS NOTE    Progress Note  Zonya Gudger  [facility]:[id number] DOB: 07-May-1932 DOA: 08/26/2017 PCP: Leeroy Cha, MD     Brief Narrative:   Taylor Kramer is an 84 y.o. female with history of hypertension, crest syndrome, anemia, constipation secondary to narcotics who was recently discharged from hospital last week after being admitted for right hip fracture was found to be hypotensive at skilled nursing facility Assessment/Plan:   AKI: Baseline cr. < 1.0. Likely pre-renal cont IV fluids, improving with IV hydration. Strict I and O's. Allow aggressive oral hydration.  Possible UTI: On rocephin, culture date pending.  Constipation: Start miralax BID, doculax suppp. Use SMOG PRN. Will need bowel regimen as an outpatient  Essential  Hypertension Hold anti-hypertensive medications. BP improved.  Normocytic anemia: Anemia panel pending.  Chronic diastolic CHF (congestive heart failure) (HCC) Seems Euvolemic.  Severe protein caloric malnutrition: encure TID   DVT prophylaxis: lovenox Family Communication:son at bedside Disposition Plan/Barrier to D/C: SNF in am Code Status:     Code Status Orders  (From admission, onward)        Start     Ordered   08/26/17 2142  Do not attempt resuscitation (DNR)  Continuous    Question Answer Comment  In the event of cardiac or respiratory ARREST Do not call a "code blue"   In the event of cardiac or respiratory ARREST Do not perform Intubation, CPR, defibrillation or ACLS   In the event of cardiac or respiratory ARREST Use medication by any route, position, wound care, and other measures to relive pain and suffering. May use oxygen, suction and manual treatment of airway obstruction as needed for comfort.      08/26/17 2143    Code Status History    Date Active Date Inactive Code Status Order ID Comments User Context   08/19/2017 03:57 08/23/2017 19:11 DNR [id number]  Ivor Costa, MD ED   03/19/2017 10:02 03/24/2017 21:05 DNR [id number]  Vashti Hey, MD ED   11/21/2014 23:28 11/24/2014 16:46 Full Code [id number]  Rise Patience, MD Inpatient        IV Access:    Peripheral IV   Procedures and diagnostic studies:   Dg Abd 1 View  Result Date: 08/27/2017 CLINICAL DATA:  Constipation.  Not eating for 2 weeks. EXAM: ABDOMEN - 1 VIEW COMPARISON:  CT abdomen and pelvis 08/22/2017. Abdominal series 08/22/2017. FINDINGS: As seen on previous images, there is diffuse distention of the colon which is filled with stool and gas. Probable mild dilatation of small bowel as well although differentiating small from large bowel is difficult due to the large amount of gas and stool in the colon. Large amount of stool in the rectum. Residual contrast material is demonstrated in the colon. Similar appearance to previous study. Surgical clips in the right upper quadrant. Postoperative changes in the right hip. IMPRESSION: Large amount of gas and stool throughout the colon, particularly in the rectum. Colonic and small bowel dilatation. Changes likely due to ileus and constipation. Similar appearance to previous study. Electronically Signed   By: Lucienne Capers M.D.   On: 08/27/2017 02:51   Dg Chest Port 1 View  Result Date: 08/26/2017 CLINICAL DATA:  84 year old with prior stroke, presenting with 2 week history of anorexia and acute hypotension. EXAM: PORTABLE CHEST 1 VIEW COMPARISON:  08/22/2017, 08/21/2017 dating back to 08/09/2010. FINDINGS: Cardiac silhouette normal in size, unchanged. Thoracic aorta tortuous and atherosclerotic, unchanged. Mild bibasilar atelectasis, right greater than left. Lungs otherwise  clear. Bronchovascular markings normal. No visible pleural effusions. Gaseous distention of the visualized colon as noted previously, though to a lesser degree than on the 08/22/2017 exam. IMPRESSION: 1. Mild bibasilar atelectasis, right greater than left. No acute  cardiopulmonary disease otherwise. 2. Gaseous distention of the visualized colon which has improved since 08/22/2017. Electronically Signed   By: Evangeline Dakin M.D.   On: 08/26/2017 17:01     Medical Consultants:    None.  Anti-Infectives:   IV rocephin  Subjective:    Chyrl Elwell realates she is feeling  Much better.  Objective:    Vitals:   08/27/17 0300 08/27/17 0330 08/27/17 0730 08/27/17 0824  BP: (!) 107/59 103/66 108/61 100/64  Pulse:  92 93 (!) 105  Resp:    18  Temp:    97.7 F (36.5 C)  TempSrc:    Oral  SpO2:  94% 98% 96%  Weight:      Height:        Intake/Output Summary (Last 24 hours) at 08/27/2017 0921 Last data filed at 08/27/2017 0151 Gross per 24 hour  Intake 2000 ml  Output 562 ml  Net 1438 ml   Filed Weights   08/26/17 1619  Weight: 44.5 kg (98 lb)    Exam: General exam: In no acute distress, cachectic Respiratory system: Good air movement and clear to auscultation. Cardiovascular system: S1 & S2 heard, RRR.  Gastrointestinal system: Abdomen is nondistended, soft and nontender.  Central nervous system: Alert and oriented. No focal neurological deficits. Extremities: No pedal edema. Skin: No rashes, lesions or ulcers Psychiatry: Judgement and insight appear normal. Mood & affect appropriate.    Data Reviewed:    Labs: Basic Metabolic Panel: Recent Labs  Lab 08/20/17 0934 08/21/17 0755 08/22/17 0432 08/26/17 1637 08/26/17 2212  NA 140 138 140 142  --   K 4.4 4.7 4.7 4.1  --   CL 102 100* 103 105  --   CO2 25 29 28 27   --   GLUCOSE 134* 104* 101* 99  --   BUN 21* 32* 41* 72*  --   CREATININE 1.10* 1.30* 1.15* 1.95* 1.64*  CALCIUM 8.0* 8.7* 8.9 8.4*  --    GFR Estimated Creatinine Clearance: 18.3 mL/min (A) (by C-G formula based on SCr of 1.64 mg/dL (H)). Liver Function Tests: Recent Labs  Lab 08/26/17 1637  AST 20  ALT 9*  ALKPHOS 70  BILITOT 0.8  PROT 6.5  ALBUMIN 3.2*   Recent Labs  Lab 08/26/17 1637    LIPASE 61*   No results for input(s): AMMONIA in the last 168 hours. Coagulation profile No results for input(s): INR, PROTIME in the last 168 hours.  CBC: Recent Labs  Lab 08/21/17 0755 08/22/17 0432 08/23/17 0538 08/26/17 1637 08/26/17 2212  WBC 12.3* 7.2 9.0 8.3 7.7  NEUTROABS  --   --   --  6.8  --   HGB 9.3* 8.4* 8.4* 8.8* 8.0*  HCT 30.0* 26.9* 26.8* 28.1* 26.1*  MCV 88.5 88.2 88.7 89.5 90.0  PLT 323 268 299 484* 301   Cardiac Enzymes: No results for input(s): CKTOTAL, CKMB, CKMBINDEX, TROPONINI in the last 168 hours. BNP (last 3 results) No results for input(s): PROBNP in the last 8760 hours. CBG: No results for input(s): GLUCAP in the last 168 hours. D-Dimer: No results for input(s): DDIMER in the last 72 hours. Hgb A1c: No results for input(s): HGBA1C in the last 72 hours. Lipid Profile: No results for input(s): CHOL, HDL,  LDLCALC, TRIG, CHOLHDL, LDLDIRECT in the last 72 hours. Thyroid function studies: No results for input(s): TSH, T4TOTAL, T3FREE, THYROIDAB in the last 72 hours.  Invalid input(s): FREET3 Anemia work up: No results for input(s): VITAMINB12, FOLATE, FERRITIN, TIBC, IRON, RETICCTPCT in the last 72 hours. Sepsis Labs: Recent Labs  Lab 08/22/17 0432 08/22/17 3536 08/23/17 0538 08/26/17 1637 08/26/17 2212  PROCALCITON  --  0.17  --   --   --   WBC 7.2  --  9.0 8.3 7.7  LATICACIDVEN  --   --   --  1.3  --    Microbiology No results found for this or any previous visit (from the past 240 hour(s)).   Medications:   . aspirin  325 mg Oral Q breakfast  . enoxaparin (LOVENOX) injection  30 mg Subcutaneous QHS  . fluticasone  2 spray Each Nare Daily  . polyvinyl alcohol  1 drop Both Eyes QID  . psyllium  1 packet Oral Daily  . senna-docusate  1 tablet Oral BID  . zinc sulfate  220 mg Oral Daily   Continuous Infusions: . sodium chloride 75 mL/hr at 08/26/17 2353  . cefTRIAXone (ROCEPHIN)  IV       LOS: 0 days   Charlynne Cousins  [facility] Pager [phone]  *Please refer to [email or web address], password [email or web address] to get updated schedule on who will round on this patient, as hospitalists switch teams weekly. If 7PM-7AM, please contact night-coverage at [email or web address], password [email or web address] for any overnight needs.  08/27/2017, 9:21 AM

## 2017-08-27 NOTE — NC FL2 (Signed)
[place] LEVEL OF CARE SCREENING TOOL     IDENTIFICATION  Patient Name: Taylor Kramer Birthdate: 1932/04/06 Sex: female Admission Date (Current Location): 08/26/2017  [facility] and [place] Number:  Herbalist and Address:  [facility],  [address] [place], [place]      Provider Number: [id number]  Attending Physician Name and Address:  Rise Patience, MD  Relative Name and Phone Number:  Guadelupe Sabin, son, [phone]    Current Level of Care: Hospital Recommended Level of Care: [place] Prior Approval Number:    Date Approved/Denied:   PASRR Number: [id number]  Discharge Plan: SNF    Current Diagnoses: Patient Active Problem List   Diagnosis Date Noted  . Constipation, chronic   . AKI (acute kidney injury) ([place])   . ARF (acute renal failure) ([place]) 08/26/2017  . Dehydration   . Dysphagia 08/21/2017  . Encounter for palliative care   . Goals of care, counseling/discussion   . Chronic diastolic CHF (congestive heart failure) ([place]) 08/20/2017  . Fall 08/19/2017  . Closed right hip fracture ([place]) 08/19/2017  . Hypokalemia 08/19/2017  . Severe protein-calorie malnutrition ([place]) 08/19/2017  . Cellulitis of left buttock   . Decubitus ulcer of back 03/19/2017  . Fecal impaction ([place]) 03/19/2017  . UTI (lower urinary tract infection) 11/21/2014  . Near syncope 11/21/2014  . Hypertension 11/21/2014  . History of CREST syndrome 11/21/2014    Orientation RESPIRATION BLADDER Height & Weight     Self, Time, Situation, Place  O2 Incontinent Weight: 98 lb (44.5 kg) Height:  5\' 7"  (170.2 cm)  BEHAVIORAL SYMPTOMS/MOOD NEUROLOGICAL BOWEL NUTRITION STATUS        Diet(see dc summary)  AMBULATORY STATUS COMMUNICATION OF NEEDS Skin   Extensive Assist Verbally Surgical wounds, Other (Comment)(Wound/Incision(OpenorDehisced)Non-pressurewoundButtocksLeft Foam;Gauze wet to dry;      RSWNIOEV(OJJKKX)38/18/29HBZJIRCV  Silicone Dressing Mepilex)                       Personal Care Assistance Level of Assistance  Bathing, Feeding, Dressing Bathing Assistance: Maximum assistance Feeding assistance: Limited assistance Dressing Assistance: Maximum assistance     Functional Limitations Info  Sight, Hearing, Speech Sight Info: Adequate Hearing Info: Adequate Speech Info: Adequate    SPECIAL CARE FACTORS FREQUENCY  PT (By licensed PT), OT (By licensed OT)     PT Frequency: 5x/week OT Frequency: 5x/week            Contractures Contractures Info: Not present    Additional Factors Info  Code Status, Allergies Code Status Info: DNR Allergies Info: NKA           Current Medications (08/27/2017):  This is the current hospital active medication list Current Facility-Administered Medications  Medication Dose Route Frequency Provider Last Rate Last Dose  . 0.9 %  sodium chloride infusion   Intravenous Continuous Rise Patience, MD 75 mL/hr at 08/26/17 2353    . acetaminophen (TYLENOL) tablet 650 mg  650 mg Oral Q6H PRN Rise Patience, MD       Or  . acetaminophen (TYLENOL) suppository 650 mg  650 mg Rectal Q6H PRN Rise Patience, MD      . aspirin EC tablet 325 mg  325 mg Oral Q breakfast Rise Patience, MD   325 mg at 08/27/17 8938  . bisacodyl (DULCOLAX) suppository 10 mg  10 mg Rectal Daily Charlynne Cousins, MD   10 mg at 08/27/17 1017  .  cefTRIAXone (ROCEPHIN) 1 g in dextrose 5 % 50 mL IVPB  1 g Intravenous Q24H Charlynne Cousins, MD      . enoxaparin (LOVENOX) injection 30 mg  30 mg Subcutaneous QHS Rise Patience, MD   30 mg at 08/26/17 2355  . fluticasone (FLONASE) 50 MCG/ACT nasal spray 2 spray  2 spray Each Nare Daily Rise Patience, MD   2 spray at 08/27/17 [phone]  . hydrALAZINE (APRESOLINE) injection 5 mg  5 mg Intravenous Q4H PRN Rise Patience, MD      . HYDROcodone-acetaminophen (NORCO/VICODIN)  5-325 MG per tablet 2 tablet  2 tablet Oral Q6H PRN Rise Patience, MD      . ondansetron [facility]) tablet 4 mg  4 mg Oral Q6H PRN Rise Patience, MD       Or  . ondansetron [facility]) injection 4 mg  4 mg Intravenous Q6H PRN Rise Patience, MD      . polyethylene glycol (MIRALAX / GLYCOLAX) packet 17 g  17 g Oral BID Charlynne Cousins, MD   17 g at 08/27/17 [phone]  . polyvinyl alcohol (LIQUIFILM TEARS) 1.4 % ophthalmic solution 1 drop  1 drop Both Eyes QID Rise Patience, MD   1 drop at 08/27/17 1034  . psyllium (HYDROCIL/METAMUCIL) packet 1 packet  1 packet Oral Daily Rise Patience, MD   1 packet at 08/27/17 [phone]  . [place]   Oral PRN Charlynne Cousins, MD      . sorbitol, milk of mag, mineral oil, glycerin (SMOG) enema  960 mL Rectal Once PRN Charlynne Cousins, MD      . zinc sulfate capsule 220 mg  220 mg Oral Daily Rise Patience, MD   220 mg at 08/26/17 2353  . zolpidem (AMBIEN) tablet 5 mg  5 mg Oral QHS PRN Rise Patience, MD   5 mg at 08/26/17 2210     Discharge Medications: Please see discharge summary for a list of discharge medications.  Relevant Imaging Results:  Relevant Lab Results:   Additional Information SS#: [place], LCSW

## 2017-08-27 NOTE — ED Notes (Signed)
Pt resting in bed with eyes close. Respirations even and unlabored. Pt has no complaints or requests at this time. Pt had to be reminded to leave [place] O2 on face

## 2017-08-27 NOTE — Progress Notes (Signed)
08/27/17  1000 Orthostatic done lying and sitting. Patient refused to stand for orthostatics. Pt made aware that MD ordered orthostatics.

## 2017-08-28 DIAGNOSIS — I959 Hypotension, unspecified: Secondary | ICD-10-CM | POA: Diagnosis not present

## 2017-08-28 DIAGNOSIS — R278 Other lack of coordination: Secondary | ICD-10-CM | POA: Diagnosis not present

## 2017-08-28 DIAGNOSIS — S72001A Fracture of unspecified part of neck of right femur, initial encounter for closed fracture: Secondary | ICD-10-CM | POA: Diagnosis not present

## 2017-08-28 DIAGNOSIS — N179 Acute kidney failure, unspecified: Secondary | ICD-10-CM | POA: Diagnosis not present

## 2017-08-28 DIAGNOSIS — I5032 Chronic diastolic (congestive) heart failure: Secondary | ICD-10-CM | POA: Diagnosis not present

## 2017-08-28 DIAGNOSIS — K5909 Other constipation: Secondary | ICD-10-CM | POA: Diagnosis not present

## 2017-08-28 DIAGNOSIS — M6281 Muscle weakness (generalized): Secondary | ICD-10-CM | POA: Diagnosis not present

## 2017-08-28 DIAGNOSIS — M6389 Disorders of muscle in diseases classified elsewhere, multiple sites: Secondary | ICD-10-CM | POA: Diagnosis not present

## 2017-08-28 DIAGNOSIS — M341 CR(E)ST syndrome: Secondary | ICD-10-CM | POA: Diagnosis not present

## 2017-08-28 DIAGNOSIS — S72001D Fracture of unspecified part of neck of right femur, subsequent encounter for closed fracture with routine healing: Secondary | ICD-10-CM | POA: Diagnosis not present

## 2017-08-28 DIAGNOSIS — I10 Essential (primary) hypertension: Secondary | ICD-10-CM | POA: Diagnosis not present

## 2017-08-28 DIAGNOSIS — E43 Unspecified severe protein-calorie malnutrition: Secondary | ICD-10-CM | POA: Diagnosis not present

## 2017-08-28 DIAGNOSIS — J690 Pneumonitis due to inhalation of food and vomit: Secondary | ICD-10-CM | POA: Diagnosis not present

## 2017-08-28 DIAGNOSIS — E869 Volume depletion, unspecified: Secondary | ICD-10-CM | POA: Diagnosis not present

## 2017-08-28 DIAGNOSIS — R2681 Unsteadiness on feet: Secondary | ICD-10-CM | POA: Diagnosis not present

## 2017-08-28 DIAGNOSIS — Z4789 Encounter for other orthopedic aftercare: Secondary | ICD-10-CM | POA: Diagnosis not present

## 2017-08-28 LAB — URINE CULTURE: CULTURE: NO GROWTH

## 2017-08-28 MED ORDER — HYDROCODONE-ACETAMINOPHEN 5-325 MG PO TABS: 2.0000 | ORAL_TABLET | Freq: Four times a day (QID) | ORAL | 0 refills | Status: AC | PRN

## 2017-08-28 MED ORDER — POTASSIUM CHLORIDE 20 MEQ/15ML (10%) PO SOLN
20.0000 meq | Freq: Three times a day (TID) | ORAL | Status: DC
  Administered 2017-08-28: 20 meq via ORAL
  Filled 2017-08-28 (×2): qty 15

## 2017-08-28 MED ORDER — POLYETHYLENE GLYCOL 3350 17 G PO PACK: 17.0000 g | PACK | Freq: Two times a day (BID) | ORAL | 0 refills | Status: AC

## 2017-08-28 NOTE — Clinical Social Work Placement (Signed)
Patient returning to [facility]. Facility aware of patient's discharge and confirmed patient's ability to return. [facility] contacted, patient's family notified. Patient's RN can call report to [phone] Room 206, packet complete. CSW signing off, no other needs identified.  CLINICAL SOCIAL WORK PLACEMENT  NOTE  Date:  08/28/2017  Patient Details  Name: Taylor Kramer MRN: [id number] Date of Birth: 03-Oct-1931  Clinical Social Work is seeking post-discharge placement for this patient at the [place] level of care (*CSW will initial, date and re-position this form in  chart as items are completed):  Yes   Patient/family provided with [facility] Department's list of facilities offering this level of care within the geographic area requested by the patient (or if unable, by the patient's family).  Yes   Patient/family informed of their freedom to choose among providers that offer the needed level of care, that participate in Medicare, Medicaid or managed care program needed by the patient, have an available bed and are willing to accept the patient.  Yes   Patient/family informed of [facility]'s ownership interest in [facility] and [facility], as well as of the fact that they are under no obligation to receive care at these facilities.  PASRR submitted to EDS on       PASRR number received on       Existing PASRR number confirmed on 08/27/17     FL2 transmitted to all facilities in geographic area requested by pt/family on 08/27/17     FL2 transmitted to all facilities within larger geographic area on       Patient informed that his/her managed care company has contracts with or will negotiate with certain facilities, including the following:        Yes   Patient/family informed of bed offers received.  Patient chooses bed at [facility] [place]     Physician recommends and patient chooses bed at      Patient to be transferred to  [facility] on 08/28/17.  Patient to be transferred to facility by PTAR     Patient family notified on 08/28/17 of transfer.  Name of family member notified:  Gerome Apley     PHYSICIAN       Additional Comment:    _______________________________________________ Burnis Medin, LCSW 08/28/2017, 10:31 AM

## 2017-08-28 NOTE — Plan of Care (Signed)
  Elimination: Will not experience complications related to bowel motility 08/28/2017 1114 - Progressing by Dorene Sorrow, RN   Nutrition: Adequate nutrition will be maintained 08/28/2017 1114 - Progressing by Dorene Sorrow, RN   Pain Managment: General experience of comfort will improve 08/28/2017 1114 - Progressing by Dorene Sorrow, RN

## 2017-08-28 NOTE — Progress Notes (Signed)
08/28/17  Called dietary 1028 food arrived after patient left at 1120.

## 2017-08-28 NOTE — Discharge Summary (Signed)
Physician Discharge Summary  Taylor Kramer WRU:[id number] DOB: 1932/02/11 DOA: 08/26/2017  PCP: Leeroy Cha, MD  Admit date: 08/26/2017 Discharge date: 08/28/2017  Admitted From: SNF Disposition:  SNF  Recommendations for Outpatient Follow-up:  1. Follow up with PCP in 1-2 weeks. 2. Check blood pressure and resume antihypertensive medication if blood pressure greater than 160/90   Home Health:No Equipment/Devices:none  Discharge Condition:guarded CODE STATUS:DNR Diet recommendation: Dys 1  Brief/Interim Summary: 84 y.o. female withhistory of hypertension, crest syndrome, anemia, constipation secondary to narcotics who was recently discharged from hospital last week after being admitted for right hip fracture was found to be hypotensive at skilled nursing facility  Discharge Diagnoses:  Principal Problem:   ARF (acute renal failure) ([place]) Active Problems:   Hypertension   History of CREST syndrome   Severe protein-calorie malnutrition ([facility])   Chronic diastolic CHF (congestive heart failure) ([facility])   Constipation, chronic   AKI (acute kidney injury) ([place])  Acute kidney injury: Likely prerenal in etiology her baseline creatinine is less than 1 admission to the hospital it was 2 after IV fluid hydration by the next day was 1.4. We will discontinue her Norvasc. The patient is able to hydrate herself orally, which she can continue at facility. Neri tract infection was ruled out.  Constipation:  She was given Dulcolax suppository and started MiraLAX p.o. twice daily which helped her have multiple bowel movements. She will continue this regimen at the facility.  Essential hypertension: Fall at the office of medications were held, if her blood pressures greater than 160/90.  Chronic diastolic heart failure: She seems to be euvolemic.  Severe protein caloric malnutrition continue Ensure 3 times daily.  Discharge Instructions  Discharge Instructions    Diet - low  sodium heart healthy   Complete by:  As directed    Increase activity slowly   Complete by:  As directed      Allergies as of 08/28/2017   No Known Allergies     Medication List    STOP taking these medications   amLODipine 2.5 MG tablet Commonly known as:  NORVASC   levofloxacin 250 MG tablet Commonly known as:  LEVAQUIN     TAKE these medications   acetaminophen 500 MG tablet Commonly known as:  TYLENOL Take 500 mg by mouth every 6 (six) hours as needed for moderate pain.   alendronate 70 MG tablet Commonly known as:  FOSAMAX Take 70 mg by mouth once a week. Take with a full glass of water on an empty stomach on Fridays.   aspirin 325 MG EC tablet Take 1 tablet (325 mg total) by mouth daily with breakfast.   cholecalciferol 1000 units tablet Commonly known as:  VITAMIN D Take 1,000 Units by mouth daily.   Cranberry 450 MG Caps Take 1 capsule by mouth 2 (two) times daily.   fluticasone 50 MCG/ACT nasal spray Commonly known as:  FLONASE Place 2 sprays into both nostrils daily.   HYDROcodone-acetaminophen 5-325 MG tablet Commonly known as:  NORCO/VICODIN Take 2 tablets by mouth every 6 (six) hours as needed for moderate pain. What changed:  how much to take   KONSYL 0.52 g capsule Generic drug:  psyllium Take 0.52 g by mouth daily.   loratadine 10 MG tablet Commonly known as:  CLARITIN Take 10 mg by mouth daily as needed for allergies.   polyethylene glycol packet Commonly known as:  MIRALAX / GLYCOLAX Take 17 g by mouth 2 (two) times daily. What changed:  when to take this  reasons to take this   senna-docusate 8.6-50 MG tablet Commonly known as:  Senokot-S Take 1 tablet by mouth 2 (two) times daily.   SYSTANE BALANCE 0.6 % Soln Generic drug:  Propylene Glycol Apply 1 drop to eye 4 (four) times daily.   vitamin C 500 MG tablet Commonly known as:  ASCORBIC ACID Take 500 mg by mouth daily.   ZINC-220 220 (50 Zn) MG capsule Generic drug:   zinc sulfate Take 220 mg by mouth daily.   zolpidem 10 MG tablet Commonly known as:  AMBIEN Take 10 mg by mouth at bedtime.       No Known Allergies  Consultations:  None   Procedures/Studies: Dg Chest 1 View  Result Date: 08/19/2017 CLINICAL DATA:  Cough.  Fall with fracture of hip. EXAM: CHEST 1 VIEW COMPARISON:  03/19/2017 FINDINGS: Patient rotation limits the evaluation. Shallow inspiration. Heart size and pulmonary vascularity are normal. Probable emphysematous and chronic bronchitic changes in the lungs. No focal consolidation or airspace disease. No pleural effusions. No pneumothorax. Gas-filled stomach may indicate gastroparesis or ileus. IMPRESSION: Shallow inspiration. Emphysematous and chronic bronchitic changes in the lungs. No evidence of active pulmonary disease. Gas distended stomach may indicate ileus or gastroparesis. Electronically Signed   By: Lucienne Capers M.D.   On: 08/19/2017 02:43   Dg Abd 1 View  Result Date: 08/27/2017 CLINICAL DATA:  Constipation.  Not eating for 2 weeks. EXAM: ABDOMEN - 1 VIEW COMPARISON:  CT abdomen and pelvis 08/22/2017. Abdominal series 08/22/2017. FINDINGS: As seen on previous images, there is diffuse distention of the colon which is filled with stool and gas. Probable mild dilatation of small bowel as well although differentiating small from large bowel is difficult due to the large amount of gas and stool in the colon. Large amount of stool in the rectum. Residual contrast material is demonstrated in the colon. Similar appearance to previous study. Surgical clips in the right upper quadrant. Postoperative changes in the right hip. IMPRESSION: Large amount of gas and stool throughout the colon, particularly in the rectum. Colonic and small bowel dilatation. Changes likely due to ileus and constipation. Similar appearance to previous study. Electronically Signed   By: Lucienne Capers M.D.   On: 08/27/2017 02:51   Ct Head Wo  Contrast  Result Date: 08/22/2017 CLINICAL DATA:  Altered mental status EXAM: CT HEAD WITHOUT CONTRAST TECHNIQUE: Contiguous axial images were obtained from the base of the skull through the vertex without intravenous contrast. COMPARISON:  None. FINDINGS: Brain: No mass lesion, intraparenchymal hemorrhage or extra-axial collection. No evidence of acute cortical infarct. Minimal periventricular white matter hypoattenuation. Vascular: No hyperdense vessel or unexpected calcification. Skull: Normal visualized skull base, calvarium and extracranial soft tissues. Sinuses/Orbits: No sinus fluid levels or advanced mucosal thickening. No mastoid effusion. Normal orbits. IMPRESSION: Normal aging brain. Electronically Signed   By: Ulyses Jarred M.D.   On: 08/22/2017 22:01   Ct Abdomen Pelvis W Contrast  Result Date: 08/22/2017 CLINICAL DATA:  Abdominal pain. Possible free air on abdominal radiographs. EXAM: CT ABDOMEN AND PELVIS WITH CONTRAST TECHNIQUE: Multidetector CT imaging of the abdomen and pelvis was performed using the standard protocol following bolus administration of intravenous contrast. CONTRAST:  78mL ISOVUE-300 IOPAMIDOL (ISOVUE-300) INJECTION 61% COMPARISON:  Abdomen series 08/22/2017. CT abdomen and pelvis 03/19/2017 FINDINGS: Lower chest: Atelectasis or consolidation in both lung bases, greater on the right. Findings could indicate pneumonia. Cardiac enlargement. Hepatobiliary: Surgical absence of the gallbladder. Mild bile duct  dilatation is probably normal for postcholecystectomy patient. No focal liver lesions are definitively identified although streak artifact limits evaluation. Pancreas: Streak artifact limits evaluation of the pancreas. There is evidence of diffuse pancreatic ductal dilatation. No obstructing stone or mass is definitively identified. Appearance is similar to previous study. No definite inflammatory infiltration or peripancreatic collections. Spleen: Streak artifact limits  evaluation. No focal lesions identified. Adrenals/Urinary Tract: There is a very large stone in the left renal pelvis measuring about 3.3 cm diameter. There is evidence of chronic left renal hydronephrosis with severe parenchymal atrophy. Additional calcifications in the lower pole left kidney. Right kidney appears normal apart from parapelvic cysts. Bladder is unremarkable. Stomach/Bowel: Stomach is moderately distended. There is residual contrast material in the stomach which is causing streak artifact, limiting evaluation of the upper abdomen. No definite obstructing lesion is identified. Fluid-filled small bowel are not significantly dilated. At the ileocecal valve, there is a long segment of overlap with some increased density and swirling of contrast material. This appearance is suspicious for ileocolonic intussusception with about a 5 cm length. There is no evidence of significant proximal obstruction. Just above the ileocecal valve, there is a 2.5 cm diameter solid appearing nodular structure which could represent a polypoid mass and cause a lead point. However, evaluation of this area is significantly limited due to stool and all these changes could just be due to stool. There is a huge amount of stool in the rectosigmoid colon. The stool-filled rectum measures up to 13.5 cm in diameter. Findings suggest stool impaction. Stercoral colitis may be present. Stool extends into the sigmoid and descending colon. The proximal colon is distended with a air and some stool. There is residual contrast material in the right colon. Appendix is not identified. There is no evidence of free intraperitoneal air. Changes on abdominal radiographs likely due to bowel gas. Vascular/Lymphatic: Aortic atherosclerosis. No enlarged abdominal or pelvic lymph nodes. Reproductive: Uterus is not identified and is presumably surgically absent. Ovaries are not visualized but no abnormal adnexal masses are appreciated. Other: No free air  or free fluid in the abdomen. Edema throughout the subcutaneous fat. Musculoskeletal: Postoperative changes with right hip internal fixation. Subcutaneous emphysema along the right hip and gluteal region, likely representing postoperative gas. Infectious etiology would also be possible but there is no evidence of abscess or significant fluid collection. Degenerative changes in the spine. No destructive bone lesions. IMPRESSION: 1. Huge amount of stool in the rectosigmoid colon with rectum measuring up to 13.5 cm in diameter. This is progressing since previous study and likely indicates significant impaction. Possible stercoral colitis. There is gaseous distention of the colon proximally. 2. Changes suggestive of intussusception at the ileal colonic valve. Possible polypoid nodule is a lead point. Changes are however nonspecific due to the stool present. No proximal small bowel dilatation. Given the apparent lack of small bowel obstruction, would recommend evaluation of the colon following disimpaction. 3. Moderate distention of the stomach. No cause identified. Possible gastroparesis. 4. Large stone in the left renal pelvis with chronic left renal obstruction and severe parenchymal atrophy. 5. Atelectasis or consolidation in both lung bases, greater on the right. This could indicate pneumonia. 6. Diffuse pancreatic ductal dilatation. No obstructing stone or mass identified and the appearance is similar to previous study from 2011 suggesting chronic process, likely chronic pancreatitis. No acute inflammatory changes. 7. Subcutaneous emphysema along the right flank and gluteal region probably representing postoperative change with recent internal fixation of the right hip. Electronically Signed  By: Lucienne Capers M.D.   On: 08/22/2017 22:31   Dg Chest Port 1 View  Result Date: 08/26/2017 CLINICAL DATA:  84 year old with prior stroke, presenting with 2 week history of anorexia and acute hypotension. EXAM:  PORTABLE CHEST 1 VIEW COMPARISON:  08/22/2017, 08/21/2017 dating back to 08/09/2010. FINDINGS: Cardiac silhouette normal in size, unchanged. Thoracic aorta tortuous and atherosclerotic, unchanged. Mild bibasilar atelectasis, right greater than left. Lungs otherwise clear. Bronchovascular markings normal. No visible pleural effusions. Gaseous distention of the visualized colon as noted previously, though to a lesser degree than on the 08/22/2017 exam. IMPRESSION: 1. Mild bibasilar atelectasis, right greater than left. No acute cardiopulmonary disease otherwise. 2. Gaseous distention of the visualized colon which has improved since 08/22/2017. Electronically Signed   By: Evangeline Dakin M.D.   On: 08/26/2017 17:01   Dg Chest Port 1 View  Result Date: 08/21/2017 CLINICAL DATA:  Aspiration pneumonia. EXAM: PORTABLE CHEST 1 VIEW COMPARISON:  08/19/2016.  03/19/2017 FINDINGS: Patient rotated to the right. Tortuous thoracic aorta is again noted. Aneurysmal dilatation cannot be excluded. No change noted from multiple prior exams. Right base atelectasis/infiltrate noted. Distended loops of bowel are noted. Contrast noted in the bowel. Free intraperitoneal air cannot be excluded. Abdominal series suggested for further evaluation. Surgical clips right upper quadrant. IMPRESSION: 1. Distended loops of bowel are noted. Free intraperitoneal air cannot be excluded. Abdominal series suggested for further evaluation. 2.  Right lower lobe atelectasis and infiltrate. 3. Stable tortuous thoracic aorta. Thoracic aortic aneurysm cannot be excluded. Heart size stable. Critical Value/emergent results were called by telephone at the time of interpretation on 08/21/2017 at 3:58 pm to Nurse Lattie Haw who verbally acknowledged these results. Electronically Signed   By: Marcello Moores  Register   On: 08/21/2017 16:00   Dg Abd Acute W/chest  Result Date: 08/22/2017 CLINICAL DATA:  Abdominal tightness.  No bowel movement. EXAM: DG ABDOMEN ACUTE W/ 1V  CHEST COMPARISON:  CXR 08/21/2017 FINDINGS: AP portable semi upright view of the chest demonstrates colonic interposition over the liver shadow. There is a crescentic air like lucency beneath the right hemidiaphragm which cannot entirely exclude free intraperitoneal air despite decubitus views. CT of the abdomen is recommended to be more certain that there is no bowel perforation. The walls of small intestine are somewhat more accentuated due to presence of a thin rim of contrast in the right upper quadrant that may be simulating a Rigler's sign of free air. A significant amount of stool is seen within the rectosigmoid extending up to the L1 level. Cholecystectomy clips are seen on the right. Soft tissue emphysema projects over the right hip that may be due to soft tissue ulceration/decubitus ulcer. Soft tissue infection or iatrogenic causes of the soft tissue air other possibilities. Cardiomegaly with atherosclerotic tortuous aorta. Atelectasis is seen at the lung bases. IMPRESSION: 1. Marked fecal impaction in the rectosigmoid extending up to the L1 level. 2. Colonic interposition is noted with bowel loops projecting over the liver shadow. Crescentic air like lucencies seen on the upright view of the chest which cannot entirely exclude the presence of a small amount of free air versus superimposed bowel. CT of the abdomen is recommended for more certainty. These results were called by telephone at the time of interpretation on 08/22/2017 at 1:55 pm to Dr. Loistine Chance , who verbally acknowledged these results. 3. Small amount of mottled soft tissue emphysema projects adjacent to the right hemipelvis and hip possibly associated with a decubitus ulcer. Soft tissue infection  or instrumentation might also account for this and should be correlated. Electronically Signed   By: Ashley Royalty M.D.   On: 08/22/2017 13:56   Dg Swallowing Func-speech Pathology  Result Date: 08/20/2017 Objective Swallowing Evaluation: Type of  Study: MBS-Modified Barium Swallow Study  Patient Details Name: Aryanna Shaver MRN: [id number] Date of Birth: May 03, 1932 Today's Date: 08/20/2017 Time: SLP Start Time (ACUTE ONLY): 1310 -SLP Stop Time (ACUTE ONLY): 1345 SLP Time Calculation (min) (ACUTE ONLY): 35 min Past Medical History: Past Medical History: Diagnosis Date . CREST syndrome ([place])  . Hypertension  . Insomnia  . Neuropathy  . Osteoporosis  Past Surgical History: Past Surgical History: Procedure Laterality Date . ABDOMINAL HYSTERECTOMY   . CHOLECYSTECTOMY   . INTRAMEDULLARY (IM) NAIL INTERTROCHANTERIC Right 08/19/2017  Procedure: INTRAMEDULLARY (IM) NAIL INTERTROCHANTRIC;  Surgeon: Mcarthur Rossetti, MD;  Location: [facility];  Service: Orthopedics;  Laterality: Right; . renal stones   HPI: 84 year old female with past medical history of hypertension and crest syndrome who resides in assisted living facility brought into the emergency room on the early morning of 1/22 after she sustained a fall landing on her right side. Is unable to stand. Patient normally ambulates with a wheelchair although for extremely short distances such as from her bed to bathroom, she uses a walker. Underwent surgery on 1/22 in the late pm.  No Data Recorded Assessment / Plan / Recommendation CHL IP CLINICAL IMPRESSIONS 08/20/2017 Clinical Impression Pt presents with a severe oropharyngeal dysphagia. There is timely and adequate laryngeal elevation, but delayed bolus propulsion and UES opening mechanism leading to pooled liquid in the pyriforms during propulsive action, which pushes aspirate into the the larynx and trachea. Aspiration quantity is severe with minimal sensation, regardless of texture. There are also severe vallecular and pyriform residuals post swallow, due to limited base of tongue propulsion, epiglottic deflection and pharyngeal contraction. Esophageal sweep also reveals signs of esophageal dysphagia. Suspect impact on connective tissue associated with CREST  syndrome. There is minimal benefit of a chin tuck, multiple effortful swallows, cued cough/throat clear and limitation of bolus size with honey thick liquids. Likely pts impairment is chronic, but potentially acutely worsened by hip fracture. CXR upon admission was clear, but pt has severe risk of respiratory infection given dysphagia, deconditioning and malnourishment. Discussed with MD who does not want to prolong NPO status and would like to trial a restricted diet with intensive interventions for potential modest improvement over hospital admission. Will initiate a puree and honey thick teaspoon diet for now. Discussed with son, who is agreeable to plan, but voices concern about his mothers potential compliance. Palliative care consult would be beneficial given complexity of impairments.   SLP Visit Diagnosis Dysphagia, oropharyngeal phase (R13.12) Attention and concentration deficit following -- Frontal lobe and executive function deficit following -- Impact on safety and function Severe aspiration risk   CHL IP TREATMENT RECOMMENDATION 08/20/2017 Treatment Recommendations Therapy as outlined in treatment plan below   Prognosis 08/20/2017 Prognosis for Safe Diet Advancement Guarded Barriers to Reach Goals -- Barriers/Prognosis Comment -- CHL IP DIET RECOMMENDATION 08/20/2017 SLP Diet Recommendations Dysphagia 1 (Puree) solids;Honey thick liquids Liquid Administration via Spoon Medication Administration Crushed with puree Compensations Slow rate;Small sips/bites;Multiple dry swallows after each bite/sip;Clear throat intermittently;Effortful swallow Postural Changes Remain semi-upright after after feeds/meals (Comment);Seated upright at 90 degrees   CHL IP OTHER RECOMMENDATIONS 08/20/2017 Recommended Consults -- Oral Care Recommendations -- Other Recommendations Have oral suction available;Order thickener from pharmacy   CHL IP FOLLOW UP RECOMMENDATIONS  08/20/2017 Follow up Recommendations Skilled Nursing facility    [facility] IP FREQUENCY AND DURATION 08/20/2017 Speech Therapy Frequency (ACUTE ONLY) min 3x week Treatment Duration 3 weeks      CHL IP ORAL PHASE 08/20/2017 Oral Phase WFL Oral - Pudding Teaspoon -- Oral - Pudding Cup -- Oral - Honey Teaspoon -- Oral - Honey Cup -- Oral - Nectar Teaspoon -- Oral - Nectar Cup -- Oral - Nectar Straw -- Oral - Thin Teaspoon -- Oral - Thin Cup -- Oral - Thin Straw -- Oral - Puree -- Oral - Mech Soft -- Oral - Regular -- Oral - Multi-Consistency -- Oral - Pill -- Oral Phase - Comment --  CHL IP PHARYNGEAL PHASE 08/20/2017 Pharyngeal Phase Impaired Pharyngeal- Pudding Teaspoon -- Pharyngeal -- Pharyngeal- Pudding Cup -- Pharyngeal -- Pharyngeal- Honey Teaspoon Delayed swallow initiation-pyriform sinuses;Reduced pharyngeal peristalsis;Reduced epiglottic inversion;Reduced anterior laryngeal mobility;Reduced airway/laryngeal closure;Reduced tongue base retraction;Penetration/Aspiration before swallow;Penetration/Aspiration during swallow;Penetration/Apiration after swallow;Significant aspiration (Amount);Pharyngeal residue - valleculae;Pharyngeal residue - pyriform Pharyngeal Material enters airway, passes BELOW cords and not ejected out despite cough attempt by patient;Material enters airway, passes BELOW cords without attempt by patient to eject out (silent aspiration) Pharyngeal- Honey Cup Delayed swallow initiation-pyriform sinuses;Reduced pharyngeal peristalsis;Reduced epiglottic inversion;Reduced anterior laryngeal mobility;Reduced airway/laryngeal closure;Reduced tongue base retraction;Penetration/Aspiration before swallow;Penetration/Aspiration during swallow;Penetration/Apiration after swallow;Significant aspiration (Amount);Pharyngeal residue - valleculae;Pharyngeal residue - pyriform Pharyngeal Material enters airway, passes BELOW cords and not ejected out despite cough attempt by patient;Material enters airway, passes BELOW cords without attempt by patient to eject out (silent  aspiration) Pharyngeal- Nectar Teaspoon Delayed swallow initiation-pyriform sinuses;Reduced pharyngeal peristalsis;Reduced epiglottic inversion;Reduced anterior laryngeal mobility;Reduced airway/laryngeal closure;Reduced tongue base retraction;Penetration/Aspiration before swallow;Penetration/Aspiration during swallow;Penetration/Apiration after swallow;Significant aspiration (Amount);Pharyngeal residue - valleculae;Pharyngeal residue - pyriform Pharyngeal Material enters airway, passes BELOW cords and not ejected out despite cough attempt by patient;Material enters airway, passes BELOW cords without attempt by patient to eject out (silent aspiration) Pharyngeal- Nectar Cup Delayed swallow initiation-pyriform sinuses;Reduced pharyngeal peristalsis;Reduced epiglottic inversion;Reduced anterior laryngeal mobility;Reduced airway/laryngeal closure;Reduced tongue base retraction;Penetration/Aspiration before swallow;Penetration/Aspiration during swallow;Penetration/Apiration after swallow;Significant aspiration (Amount);Pharyngeal residue - valleculae;Pharyngeal residue - pyriform Pharyngeal Material enters airway, passes BELOW cords and not ejected out despite cough attempt by patient;Material enters airway, passes BELOW cords without attempt by patient to eject out (silent aspiration) Pharyngeal- Nectar Straw Delayed swallow initiation-pyriform sinuses;Reduced pharyngeal peristalsis;Reduced epiglottic inversion;Reduced anterior laryngeal mobility;Reduced airway/laryngeal closure;Reduced tongue base retraction;Penetration/Aspiration before swallow;Penetration/Aspiration during swallow;Penetration/Apiration after swallow;Significant aspiration (Amount);Pharyngeal residue - valleculae;Pharyngeal residue - pyriform Pharyngeal Material enters airway, passes BELOW cords and not ejected out despite cough attempt by patient;Material enters airway, passes BELOW cords without attempt by patient to eject out (silent aspiration)  Pharyngeal- Thin Teaspoon -- Pharyngeal -- Pharyngeal- Thin Cup Delayed swallow initiation-pyriform sinuses;Reduced pharyngeal peristalsis;Reduced epiglottic inversion;Reduced anterior laryngeal mobility;Reduced airway/laryngeal closure;Reduced tongue base retraction;Penetration/Aspiration before swallow;Penetration/Aspiration during swallow;Penetration/Apiration after swallow;Significant aspiration (Amount);Pharyngeal residue - valleculae;Pharyngeal residue - pyriform Pharyngeal -- Pharyngeal- Thin Straw -- Pharyngeal -- Pharyngeal- Puree Delayed swallow initiation-pyriform sinuses;Reduced pharyngeal peristalsis;Reduced epiglottic inversion;Reduced anterior laryngeal mobility;Reduced airway/laryngeal closure;Reduced tongue base retraction;Penetration/Aspiration before swallow;Penetration/Aspiration during swallow;Penetration/Apiration after swallow;Significant aspiration (Amount);Pharyngeal residue - valleculae;Pharyngeal residue - pyriform Pharyngeal Material does not enter airway Pharyngeal- Mechanical Soft Delayed swallow initiation-pyriform sinuses;Reduced pharyngeal peristalsis;Reduced epiglottic inversion;Reduced anterior laryngeal mobility;Reduced airway/laryngeal closure;Reduced tongue base retraction;Penetration/Aspiration before swallow;Penetration/Aspiration during swallow;Penetration/Apiration after swallow;Significant aspiration (Amount);Pharyngeal residue - valleculae;Pharyngeal residue - pyriform Pharyngeal Material does not enter airway Pharyngeal- Regular -- Pharyngeal -- Pharyngeal- Multi-consistency -- Pharyngeal -- Pharyngeal- Pill -- Pharyngeal -- Pharyngeal Comment --  CHL IP  CERVICAL ESOPHAGEAL PHASE 08/20/2017 Cervical Esophageal Phase Impaired Pudding Teaspoon -- Pudding Cup -- Honey Teaspoon -- Honey Cup -- Nectar Teaspoon -- Nectar Cup -- Nectar Straw -- Thin Teaspoon -- Thin Cup -- Thin Straw -- Puree -- Mechanical Soft -- Regular -- Multi-consistency -- Pill -- Cervical Esophageal  Comment -- No flowsheet data found. Herbie [place], MA CCC-SLP [phone] Lynann Beaver 08/20/2017, 2:17 PM              Dg C-arm 1-60 Min  Result Date: 08/19/2017 CLINICAL DATA:  Status post ORIF of right hip fracture. EXAM: RIGHT FEMUR 2 VIEWS; DG C-ARM 61-120 MIN COMPARISON:  08/19/2017 FINDINGS: Four images from portable C-arm radiography obtained in the operating room show intramedullary nail and hip screw reduction and internal fixation of acute intertrochanteric fracture involving the proximal right femur. IMPRESSION: Status post ORIF of proximal right femur fracture. Electronically Signed   By: Kerby Moors M.D.   On: 08/19/2017 20:50   Dg Hip Unilat  With Pelvis 2-3 Views Right  Result Date: 08/19/2017 CLINICAL DATA:  Fall with right hip pain EXAM: DG HIP (WITH OR WITHOUT PELVIS) 2-3V RIGHT COMPARISON:  CT abdomen pelvis 03/17/2017 FINDINGS: Comminuted intertrochanteric fracture of the proximal right femur. No femoral head dislocation. Pubic symphysis is intact. Massive feces in the pelvis. IMPRESSION: Of acute comminuted intertrochanteric fracture of the proximal right femur. Massive feces in the pelvis. Electronically Signed   By: Donavan Foil M.D.   On: 08/19/2017 02:43   Dg Femur, Min 2 Views Right  Result Date: 08/19/2017 CLINICAL DATA:  Status post ORIF of right hip fracture. EXAM: RIGHT FEMUR 2 VIEWS; DG C-ARM 61-120 MIN COMPARISON:  08/19/2017 FINDINGS: Four images from portable C-arm radiography obtained in the operating room show intramedullary nail and hip screw reduction and internal fixation of acute intertrochanteric fracture involving the proximal right femur. IMPRESSION: Status post ORIF of proximal right femur fracture. Electronically Signed   By: Kerby Moors M.D.   On: 08/19/2017 20:50   (Echo, Carotid, EGD, Colonoscopy, ERCP)    Subjective:   Discharge Exam: Vitals:   08/27/17 2157 08/28/17 0458  BP: (!) 97/59 107/62  Pulse: 96 96  Resp: 16 16   Temp: 97.8 F (36.6 C) 97.6 F (36.4 C)  SpO2: 96%    Vitals:   08/27/17 1800 08/27/17 2157 08/28/17 0458 08/28/17 0500  BP: (!) 112/59 (!) 97/59 107/62   Pulse: 85 96 96   Resp:  16 16   Temp:  97.8 F (36.6 C) 97.6 F (36.4 C)   TempSrc:  Oral Oral   SpO2:  96%    Weight:    48.1 kg (106 lb 0.7 oz)  Height:        General: Pt is alert, awake, not in acute distress Cardiovascular: RRR, S1/S2 +, no rubs, no gallops Respiratory: CTA bilaterally, no wheezing, no rhonchi Abdominal: Soft, NT, ND, bowel sounds + Extremities: no edema, no cyanosis    The results of significant diagnostics from this hospitalization (including imaging, microbiology, ancillary and laboratory) are listed below for reference.     Microbiology: Recent Results (from the past 240 hour(s))  Culture, blood (Routine X 2) w Reflex to ID Panel     Status: None (Preliminary result)   Collection Time: 08/26/17  4:37 PM  Result Value Ref Range Status   Specimen Description BLOOD RIGHT ANTECUBITAL  Final   Special Requests   Final    BOTTLES DRAWN AEROBIC AND ANAEROBIC Blood Culture  adequate volume   Culture   Final    NO GROWTH < 24 HOURS Performed at [facility], [place] [address] [place], [place] [address]    Report Status PENDING  Incomplete  Culture, blood (Routine X 2) w Reflex to ID Panel     Status: None (Preliminary result)   Collection Time: 08/26/17  4:42 PM  Result Value Ref Range Status   Specimen Description BLOOD LEFT ANTECUBITAL  Final   Special Requests   Final    BOTTLES DRAWN AEROBIC AND ANAEROBIC Blood Culture adequate volume   Culture   Final    NO GROWTH < 24 HOURS Performed at [facility], [place] [address], [place],  [address]    Report Status PENDING  Incomplete  MRSA PCR Screening     Status: Abnormal   Collection Time: 08/27/17  5:47 PM  Result Value Ref Range Status   MRSA by PCR POSITIVE (A) NEGATIVE Final    Comment:        The GeneXpert MRSA Assay  (FDA approved for NASAL specimens only), is one component of a comprehensive MRSA colonization surveillance program. It is not intended to diagnose MRSA infection nor to guide or monitor treatment for MRSA infections. RESULT CALLED TO, READ BACK BY AND VERIFIED WITH: Leighton Parody [id number] @ [place]: BNP (last 3 results) Recent Labs    08/20/17 1342 08/26/17 1637  BNP 84.0 43.1   Basic Metabolic Panel: Recent Labs  Lab 08/22/17 0432 08/26/17 1637 08/26/17 2212 08/27/17 1010  NA 140 142  --  145  K 4.7 4.1  --  3.4*  CL 103 105  --  113*  CO2 28 27  --  25  GLUCOSE 101* 99  --  87  BUN 41* 72*  --  65*  CREATININE 1.15* 1.95* 1.64* 1.44*  CALCIUM 8.9 8.4*  --  7.3*   Liver Function Tests: Recent Labs  Lab 08/26/17 1637  AST 20  ALT 9*  ALKPHOS 70  BILITOT 0.8  PROT 6.5  ALBUMIN 3.2*   Recent Labs  Lab 08/26/17 1637  LIPASE 61*   No results for input(s): AMMONIA in the last 168 hours. CBC: Recent Labs  Lab 08/22/17 0432 08/23/17 0538 08/26/17 1637 08/26/17 2212 08/27/17 1010  WBC 7.2 9.0 8.3 7.7 7.3  NEUTROABS  --   --  6.8  --   --   HGB 8.4* 8.4* 8.8* 8.0* 7.9*  HCT 26.9* 26.8* 28.1* 26.1* 25.1*  MCV 88.2 88.7 89.5 90.0 88.4  PLT 268 299 484* 301 464*   Cardiac Enzymes: No results for input(s): CKTOTAL, CKMB, CKMBINDEX, TROPONINI in the last 168 hours. BNP: Invalid input(s): POCBNP CBG: No results for input(s): GLUCAP in the last 168 hours. D-Dimer No results for input(s): DDIMER in the last 72 hours. Hgb A1c No results for input(s): HGBA1C in the last 72 hours. Lipid Profile No results for input(s): CHOL, HDL, LDLCALC, TRIG, CHOLHDL, LDLDIRECT in the last 72 hours. Thyroid function studies No results for input(s): TSH, T4TOTAL, T3FREE, THYROIDAB in the last 72 hours.  Invalid input(s): FREET3 Anemia work up Recent Labs    08/27/17 1010  VITAMINB12 247  FOLATE 6.6  FERRITIN 174  TIBC 217*  IRON 11*   RETICCTPCT 2.1   Urinalysis    Component Value Date/Time   COLORURINE AMBER (A) 08/27/2017 0055   APPEARANCEUR HAZY (A) 08/27/2017 0055   LABSPEC 1.026 08/27/2017  [place] 5.0 08/27/2017 0055   GLUCOSEU NEGATIVE 08/27/2017 0055   HGBUR NEGATIVE 08/27/2017 0055   BILIRUBINUR NEGATIVE 08/27/2017 0055   KETONESUR NEGATIVE 08/27/2017 0055   PROTEINUR 30 (A) 08/27/2017 0055   UROBILINOGEN 1.0 11/21/2014 2046   NITRITE NEGATIVE 08/27/2017 0055   LEUKOCYTESUR SMALL (A) 08/27/2017 0055   Sepsis Labs Invalid input(s): PROCALCITONIN,  WBC,  LACTICIDVEN Microbiology Recent Results (from the past 240 hour(s))  Culture, blood (Routine X 2) w Reflex to ID Panel     Status: None (Preliminary result)   Collection Time: 08/26/17  4:37 PM  Result Value Ref Range Status   Specimen Description BLOOD RIGHT ANTECUBITAL  Final   Special Requests   Final    BOTTLES DRAWN AEROBIC AND ANAEROBIC Blood Culture adequate volume   Culture   Final    NO GROWTH < 24 HOURS Performed at [facility], [place] [address], [place], [place] [address]    Report Status PENDING  Incomplete  Culture, blood (Routine X 2) w Reflex to ID Panel     Status: None (Preliminary result)   Collection Time: 08/26/17  4:42 PM  Result Value Ref Range Status   Specimen Description BLOOD LEFT ANTECUBITAL  Final   Special Requests   Final    BOTTLES DRAWN AEROBIC AND ANAEROBIC Blood Culture adequate volume   Culture   Final    NO GROWTH < 24 HOURS Performed at [facility], [place] [address] [place], [place] [address]    Report Status PENDING  Incomplete  MRSA PCR Screening     Status: Abnormal   Collection Time: 08/27/17  5:47 PM  Result Value Ref Range Status   MRSA by PCR POSITIVE (A) NEGATIVE Final    Comment:        The GeneXpert MRSA Assay (FDA approved for NASAL specimens only), is one component of a comprehensive MRSA colonization surveillance program. It is not intended to diagnose MRSA infection  nor to guide or monitor treatment for MRSA infections. RESULT CALLED TO, READ BACK BY AND VERIFIED WITH: Leighton Parody [id number] @ [place]      Time coordinating discharge: Over 30 minutes  SIGNED:   Charlynne Cousins, MD  [facility] 08/28/2017, 8:30 AM Pager   If 7PM-7AM, please contact night-coverage [email or web address] Password [email or web address]

## 2017-08-28 NOTE — Progress Notes (Signed)
PT Cancellation Note  Patient Details Name: Taylor Kramer MRN: [id number] DOB: July 04, 1932   Cancelled Treatment:    Reason Eval/Treat Not Completed: PT screened, no needs identified, will sign off, returning to SNF, refused PT yesterday.   [name] 08/28/2017, 9:49 AM  Tresa Endo PT [phone]

## 2017-08-28 NOTE — Progress Notes (Signed)
08/28/17  Called report to [facility] at [phone]. Pt going to rm# [id number].

## 2017-08-31 DIAGNOSIS — S72001A Fracture of unspecified part of neck of right femur, initial encounter for closed fracture: Secondary | ICD-10-CM | POA: Diagnosis not present

## 2017-08-31 DIAGNOSIS — M341 CR(E)ST syndrome: Secondary | ICD-10-CM | POA: Diagnosis not present

## 2017-08-31 DIAGNOSIS — I10 Essential (primary) hypertension: Secondary | ICD-10-CM | POA: Diagnosis not present

## 2017-08-31 DIAGNOSIS — E43 Unspecified severe protein-calorie malnutrition: Secondary | ICD-10-CM | POA: Diagnosis not present

## 2017-08-31 LAB — CULTURE, BLOOD (ROUTINE X 2)
CULTURE: NO GROWTH
Culture: NO GROWTH
SPECIAL REQUESTS: ADEQUATE
SPECIAL REQUESTS: ADEQUATE

## 2017-09-02 ENCOUNTER — Inpatient Hospital Stay (INDEPENDENT_AMBULATORY_CARE_PROVIDER_SITE_OTHER): Payer: Medicare Other | Admitting: Orthopaedic Surgery

## 2017-09-04 ENCOUNTER — Encounter (HOSPITAL_BASED_OUTPATIENT_CLINIC_OR_DEPARTMENT_OTHER): Payer: Medicare Other

## 2017-09-26 DEATH — deceased

## 2019-05-04 IMAGING — CT CT ABD-PELV W/ CM
2 of 5 series · 14 of 46 positions shown, 16 images · IV contrast (ISOVUE)
Comparison: None.

CLINICAL DATA: Worsening decubitus ulcer over the past year. Fever
and foul odor.

EXAM:
CT ABDOMEN AND PELVIS WITH CONTRAST
TECHNIQUE: Multidetector CT imaging of the abdomen and pelvis was performed
using the standard protocol following bolus administration of
intravenous contrast.
CONTRAST:  80 cc [id number] IV

[Series 2: abd/pel with · axial · 0.70mm/px · z∈[+946,+1381]mm · 11 of 99 slices shown, 13 images]
[im 6/99  soft-tissue]
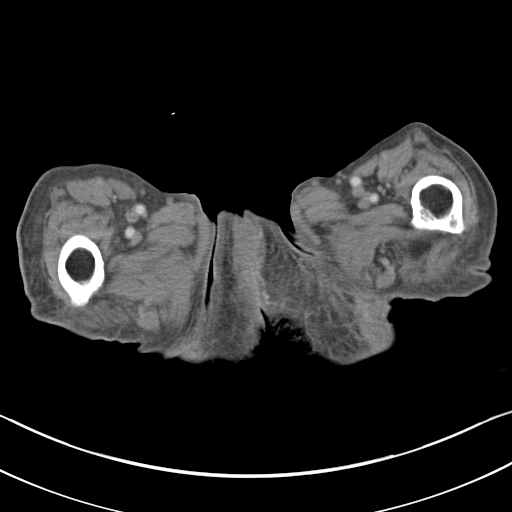
[im 6/99  bone]
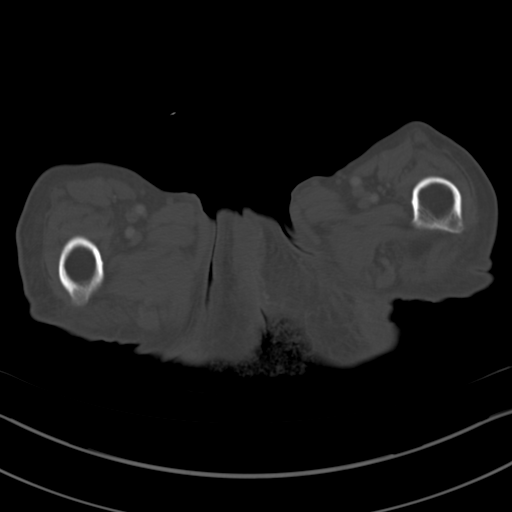
[im 17/99  soft-tissue]
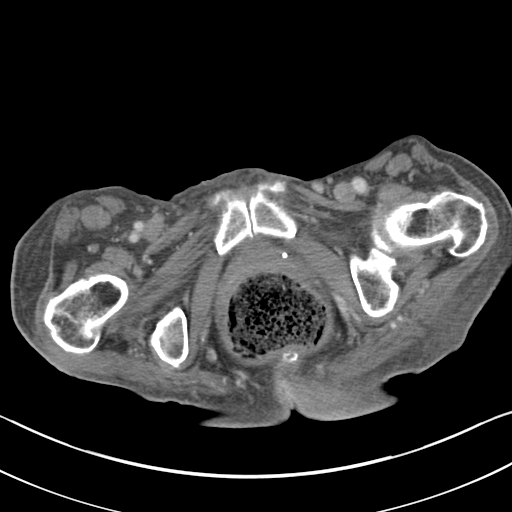
[im 22/99  soft-tissue]
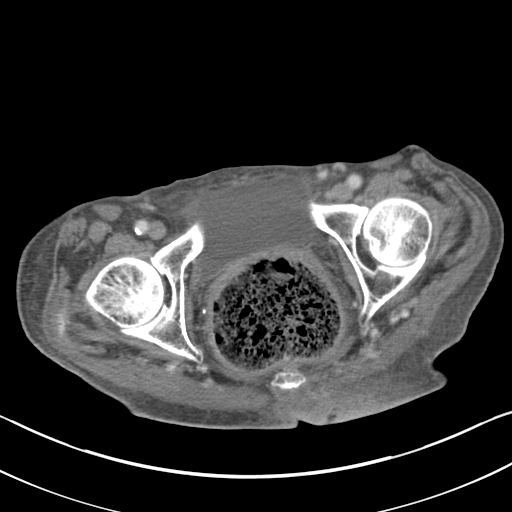
[im 33/99  soft-tissue]
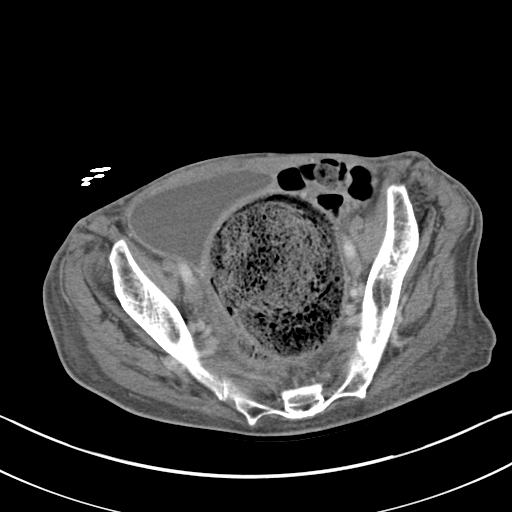
[im 39/99  soft-tissue]
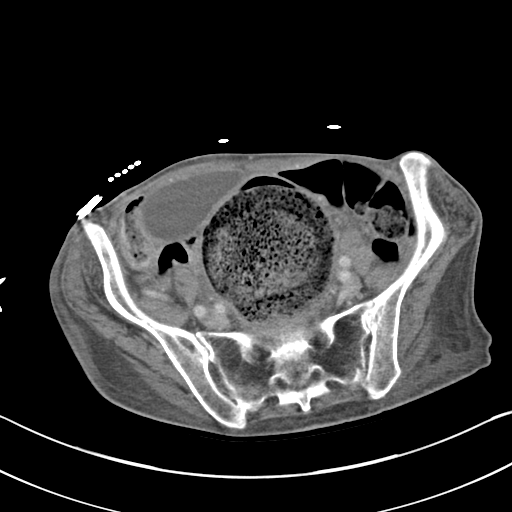
[im 50/99  soft-tissue]
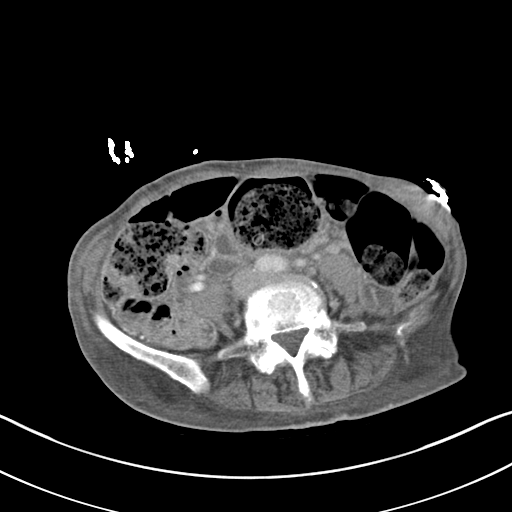
[im 60/99  soft-tissue]
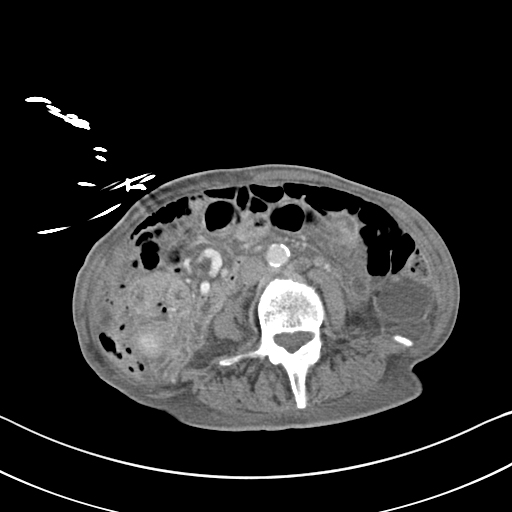
[im 66/99  soft-tissue]
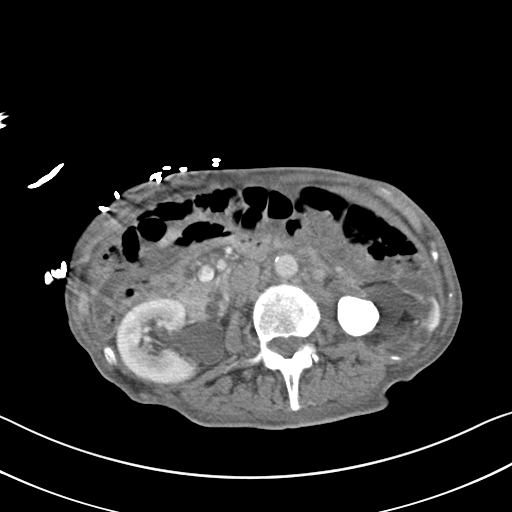
[im 77/99  soft-tissue]
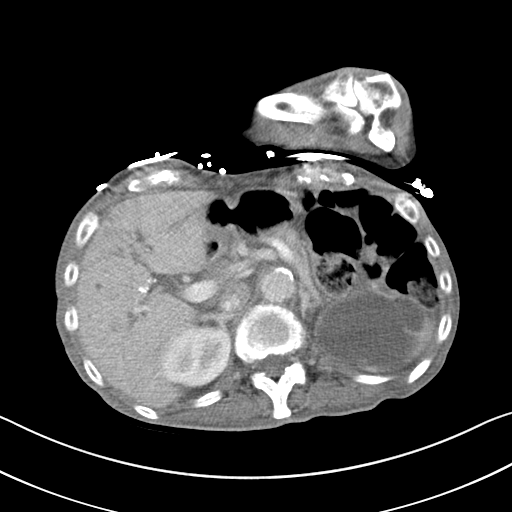
[im 77/99  bone]
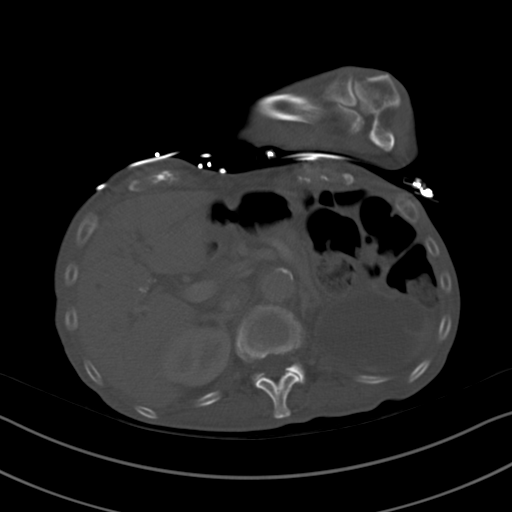
[im 82/99  soft-tissue]
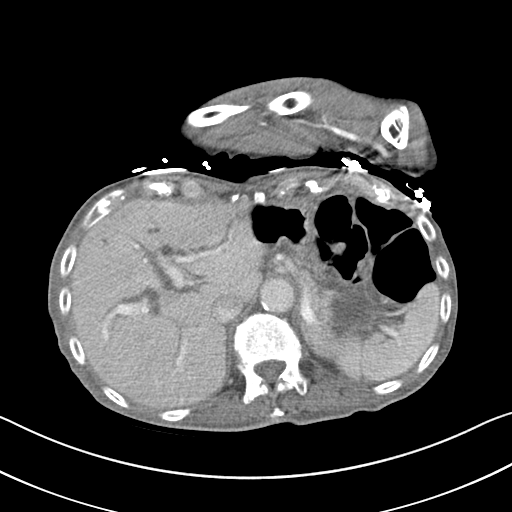
[im 93/99  soft-tissue]
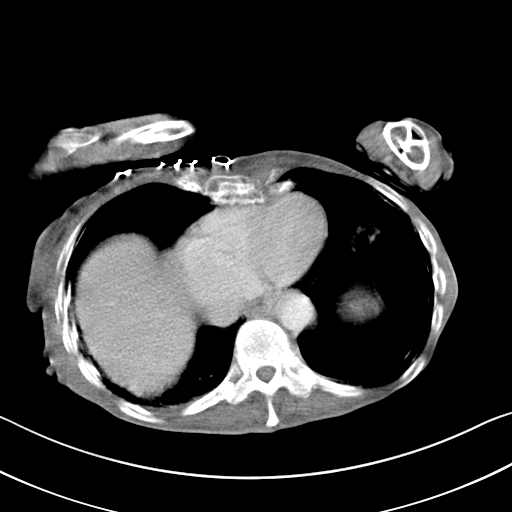

[Series 4: coronal a/|p · coronal · 0.64mm/px · 3 of 92 slices shown]
[im 31/92  soft-tissue]
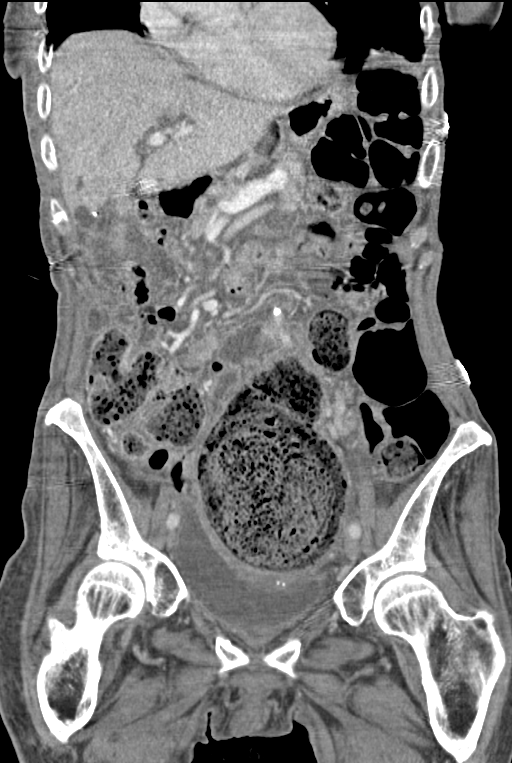
[im 41/92  soft-tissue]
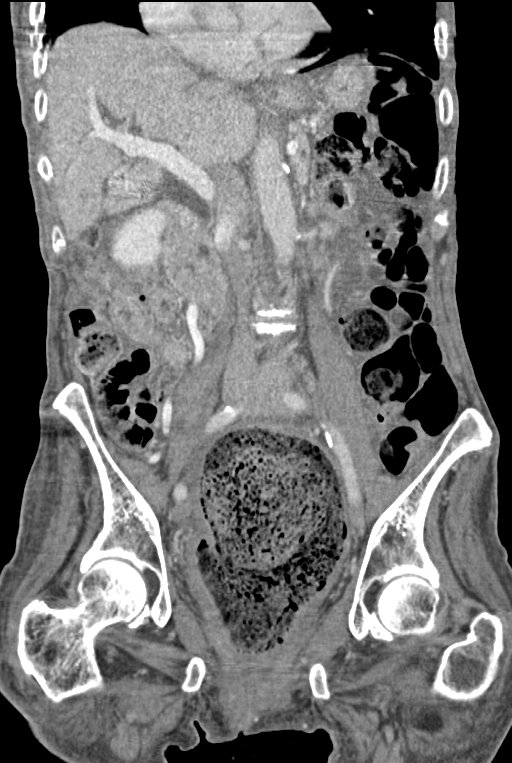
[im 51/92  soft-tissue]
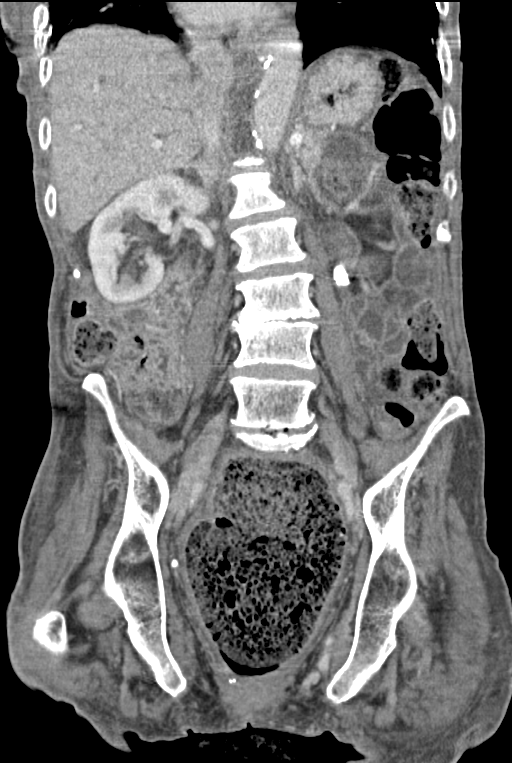

[14 of 46 positions shown; findings below may reference images not displayed]

FINDINGS: Lower chest: Motion artifact the lung bases. Probable atelectasis no
dependent right lower lobe.

Hepatobiliary: Postcholecystectomy with clips in the gallbladder
fossa. There is intra and extrahepatic biliary ductal dilatation,
common bile duct measures approximately 10 mm distally. Probable
cysts in the inferior right lobe of the liver.

Pancreas: Mild pancreatic ductal dilatation measuring 5 mm.
Parenchymal atrophy. No peripancreatic inflammation. No discrete
lesion is seen.

Spleen: Normal in size without focal abnormality.

Adrenals/Urinary Tract: No adrenal nodule. Staghorn 3.1 cm calculus
in the left renal pelvis with chronic hydronephrosis and marked
renal parenchymal atrophy. Additional nonobstructing stones
scattered throughout the left kidney. Dilatation of right renal
pelvis with transition at the ureteropelvic junction. No right
perinephric edema. Simple cyst in the lower right kidney. Urinary
bladder is displaced anteriorly due to large stool ball in the
rectum.

Stomach/Bowel: Lack of enteric contrast and paucity intra-abdominal
fat limits assessment. No bowel dilatation to suggest obstruction.
Colonic tortuosity with moderate colonic stool burden. Large stool
ball distends the distal sigmoid colon and rectum spinning 10.3 cm.
Mild associated ictal wall thickening. The appendix is tentatively
identified and normal.

Vascular/Lymphatic: Aortic atherosclerosis without aneurysm. Limited
assessment for adenopathy given paucity of intra-abdominal fat and
lack of enteric contrast, no bulky adenopathy is seen.

Reproductive: Status post hysterectomy. No adnexal masses.

Other: No ascites or free air.

Musculoskeletal: Skin thickening with associated defect/ ulcer
posterior to the left aspect of the sacrum. Surrounding edematous
changes with no discrete fluid collection. No periosteal reaction or
bony destructive change of the underlying sacrum to suggest
osteomyelitis. There is degenerative change in the spine. Remote
posterior left twelfth rib fracture.
IMPRESSION: 1. Skin thickening and subcutaneous edema with soft tissue ulcer
overlying the left sacrum. No abscess or CT findings of
osteomyelitis.
2. Large stool ball distending the rectosigmoid colon with rectal
wall thickening, consistent with fecal impaction.
3. Staghorn calculus in the left renal pelvis with chronic
hydronephrosis and renal parenchymal atrophy.
4. Postcholecystectomy with intra and extrahepatic biliary ductal
dilatation. Recommend correlation with LFTs, if elevated consider
further evaluation with MRCP, ERCP or EUS. Additionally there is
mild pancreatic ductal dilatation. Pancreatic head mass not
excluded, but not seen on CT.
5. Aortic atherosclerosis.
